# Patient Record
Sex: Male | Born: 1950
Health system: Southern US, Community
[De-identification: ages and names within clinical notes are randomized; demographics above are authoritative.]

## PROBLEM LIST (undated history)

## (undated) DIAGNOSIS — E1169 Type 2 diabetes mellitus with other specified complication: Secondary | ICD-10-CM

## (undated) DIAGNOSIS — E119 Type 2 diabetes mellitus without complications: Secondary | ICD-10-CM

## (undated) DIAGNOSIS — I10 Essential (primary) hypertension: Secondary | ICD-10-CM

## (undated) DIAGNOSIS — E785 Hyperlipidemia, unspecified: Secondary | ICD-10-CM

## (undated) DIAGNOSIS — N400 Enlarged prostate without lower urinary tract symptoms: Secondary | ICD-10-CM

## (undated) HISTORY — DX: Type 2 diabetes mellitus with other specified complication: E78.5

## (undated) HISTORY — DX: Type 2 diabetes mellitus with other specified complication: E11.69

## (undated) HISTORY — PX: JOINT REPLACEMENT: SHX530

---

## 2009-01-07 HISTORY — PX: JOINT REPLACEMENT: SHX530

## 2014-10-31 LAB — HM DIABETES EYE EXAM

## 2016-02-21 DIAGNOSIS — E1140 Type 2 diabetes mellitus with diabetic neuropathy, unspecified: Secondary | ICD-10-CM | POA: Diagnosis not present

## 2016-02-21 DIAGNOSIS — I10 Essential (primary) hypertension: Secondary | ICD-10-CM | POA: Diagnosis not present

## 2016-02-21 DIAGNOSIS — Z6832 Body mass index (BMI) 32.0-32.9, adult: Secondary | ICD-10-CM | POA: Diagnosis not present

## 2016-02-21 DIAGNOSIS — E785 Hyperlipidemia, unspecified: Secondary | ICD-10-CM | POA: Diagnosis not present

## 2016-03-21 DIAGNOSIS — N401 Enlarged prostate with lower urinary tract symptoms: Secondary | ICD-10-CM | POA: Diagnosis not present

## 2016-03-21 DIAGNOSIS — R3912 Poor urinary stream: Secondary | ICD-10-CM | POA: Diagnosis not present

## 2016-03-21 DIAGNOSIS — N5201 Erectile dysfunction due to arterial insufficiency: Secondary | ICD-10-CM | POA: Diagnosis not present

## 2016-05-28 DIAGNOSIS — L600 Ingrowing nail: Secondary | ICD-10-CM | POA: Diagnosis not present

## 2016-05-28 DIAGNOSIS — E559 Vitamin D deficiency, unspecified: Secondary | ICD-10-CM | POA: Diagnosis not present

## 2016-05-28 DIAGNOSIS — Z0000 Encounter for general adult medical examination without abnormal findings: Secondary | ICD-10-CM | POA: Diagnosis not present

## 2016-05-28 DIAGNOSIS — I10 Essential (primary) hypertension: Secondary | ICD-10-CM | POA: Diagnosis not present

## 2016-05-28 DIAGNOSIS — E1140 Type 2 diabetes mellitus with diabetic neuropathy, unspecified: Secondary | ICD-10-CM | POA: Diagnosis not present

## 2016-07-11 DIAGNOSIS — Z6832 Body mass index (BMI) 32.0-32.9, adult: Secondary | ICD-10-CM | POA: Diagnosis not present

## 2016-07-11 DIAGNOSIS — E1140 Type 2 diabetes mellitus with diabetic neuropathy, unspecified: Secondary | ICD-10-CM | POA: Diagnosis not present

## 2016-09-11 DIAGNOSIS — Z79899 Other long term (current) drug therapy: Secondary | ICD-10-CM | POA: Diagnosis not present

## 2016-09-11 DIAGNOSIS — B356 Tinea cruris: Secondary | ICD-10-CM | POA: Diagnosis not present

## 2016-09-11 DIAGNOSIS — I10 Essential (primary) hypertension: Secondary | ICD-10-CM | POA: Diagnosis not present

## 2016-09-11 DIAGNOSIS — E1140 Type 2 diabetes mellitus with diabetic neuropathy, unspecified: Secondary | ICD-10-CM | POA: Diagnosis not present

## 2016-10-16 DIAGNOSIS — Z20828 Contact with and (suspected) exposure to other viral communicable diseases: Secondary | ICD-10-CM | POA: Diagnosis not present

## 2016-10-16 DIAGNOSIS — R05 Cough: Secondary | ICD-10-CM | POA: Diagnosis not present

## 2016-10-16 DIAGNOSIS — J309 Allergic rhinitis, unspecified: Secondary | ICD-10-CM | POA: Diagnosis not present

## 2016-11-18 DIAGNOSIS — H524 Presbyopia: Secondary | ICD-10-CM | POA: Diagnosis not present

## 2017-01-15 DIAGNOSIS — Z6831 Body mass index (BMI) 31.0-31.9, adult: Secondary | ICD-10-CM | POA: Diagnosis not present

## 2017-01-15 DIAGNOSIS — J309 Allergic rhinitis, unspecified: Secondary | ICD-10-CM | POA: Diagnosis not present

## 2017-01-15 DIAGNOSIS — I10 Essential (primary) hypertension: Secondary | ICD-10-CM | POA: Diagnosis not present

## 2017-01-15 DIAGNOSIS — E1140 Type 2 diabetes mellitus with diabetic neuropathy, unspecified: Secondary | ICD-10-CM | POA: Diagnosis not present

## 2017-04-24 DIAGNOSIS — Z125 Encounter for screening for malignant neoplasm of prostate: Secondary | ICD-10-CM | POA: Diagnosis not present

## 2017-04-24 DIAGNOSIS — N5201 Erectile dysfunction due to arterial insufficiency: Secondary | ICD-10-CM | POA: Diagnosis not present

## 2017-04-24 DIAGNOSIS — N401 Enlarged prostate with lower urinary tract symptoms: Secondary | ICD-10-CM | POA: Diagnosis not present

## 2017-04-24 DIAGNOSIS — R3912 Poor urinary stream: Secondary | ICD-10-CM | POA: Diagnosis not present

## 2017-06-04 DIAGNOSIS — E1140 Type 2 diabetes mellitus with diabetic neuropathy, unspecified: Secondary | ICD-10-CM | POA: Diagnosis not present

## 2017-07-23 DIAGNOSIS — E1140 Type 2 diabetes mellitus with diabetic neuropathy, unspecified: Secondary | ICD-10-CM | POA: Diagnosis not present

## 2017-07-23 LAB — BASIC METABOLIC PANEL
BUN: 17 (ref 4–21)
CREATININE: 1.2 (ref 0.6–1.3)
Glucose: 115
POTASSIUM: 4.4 (ref 3.4–5.3)
Sodium: 144 (ref 137–147)

## 2017-07-23 LAB — HEPATIC FUNCTION PANEL
ALT: 16 (ref 10–40)
AST: 18 (ref 14–40)
Alkaline Phosphatase: 74 (ref 25–125)
Bilirubin, Total: 0.2

## 2017-07-23 LAB — LIPID PANEL
Cholesterol: 142 (ref 0–200)
HDL: 43 (ref 35–70)
LDL CALC: 71
LDL/HDL RATIO: 1.7
TRIGLYCERIDES: 139 (ref 40–160)

## 2017-07-23 LAB — HEMOGLOBIN A1C: Hgb A1c MFr Bld: 6.8 — AB (ref 4.0–6.0)

## 2017-07-30 DIAGNOSIS — M25512 Pain in left shoulder: Secondary | ICD-10-CM | POA: Diagnosis not present

## 2017-07-30 DIAGNOSIS — M25562 Pain in left knee: Secondary | ICD-10-CM | POA: Diagnosis not present

## 2017-08-18 DIAGNOSIS — M545 Low back pain: Secondary | ICD-10-CM | POA: Diagnosis not present

## 2017-08-18 DIAGNOSIS — R350 Frequency of micturition: Secondary | ICD-10-CM | POA: Diagnosis not present

## 2017-10-15 ENCOUNTER — Other Ambulatory Visit: Payer: Self-pay | Admitting: Internal Medicine

## 2017-10-27 ENCOUNTER — Emergency Department (HOSPITAL_COMMUNITY)
Admission: EM | Admit: 2017-10-27 | Discharge: 2017-10-27 | Disposition: A | Discharge status: Home / Self Care | Payer: Medicare PPO | Attending: Emergency Medicine | Admitting: Emergency Medicine

## 2017-10-27 ENCOUNTER — Encounter (HOSPITAL_COMMUNITY): Payer: Self-pay | Admitting: Emergency Medicine

## 2017-10-27 ENCOUNTER — Emergency Department (HOSPITAL_COMMUNITY): Payer: Medicare PPO

## 2017-10-27 DIAGNOSIS — E119 Type 2 diabetes mellitus without complications: Secondary | ICD-10-CM | POA: Insufficient documentation

## 2017-10-27 DIAGNOSIS — I10 Essential (primary) hypertension: Secondary | ICD-10-CM | POA: Diagnosis not present

## 2017-10-27 DIAGNOSIS — M545 Low back pain, unspecified: Secondary | ICD-10-CM

## 2017-10-27 DIAGNOSIS — Z96653 Presence of artificial knee joint, bilateral: Secondary | ICD-10-CM | POA: Diagnosis not present

## 2017-10-27 HISTORY — DX: Essential (primary) hypertension: I10

## 2017-10-27 HISTORY — DX: Benign prostatic hyperplasia without lower urinary tract symptoms: N40.0

## 2017-10-27 HISTORY — DX: Type 2 diabetes mellitus without complications: E11.9

## 2017-10-27 MED ORDER — METHOCARBAMOL 500 MG PO TABS: 500.0000 mg | ORAL_TABLET | Freq: Two times a day (BID) | ORAL | 0 refills | Status: DC

## 2017-10-27 MED ORDER — HYDROCODONE-ACETAMINOPHEN 5-325 MG PO TABS: 1.0000 | ORAL_TABLET | Freq: Four times a day (QID) | ORAL | 0 refills | Status: DC | PRN

## 2017-10-27 MED ORDER — DICLOFENAC SODIUM 1 % TD GEL: 4.0000 g | Freq: Four times a day (QID) | TRANSDERMAL | 0 refills | Status: DC

## 2017-10-27 MED ORDER — CYCLOBENZAPRINE HCL 10 MG PO TABS
10.0000 mg | ORAL_TABLET | Freq: Once | ORAL | Status: AC
  Administered 2017-10-27: 10 mg via ORAL
  Filled 2017-10-27: qty 1

## 2017-10-27 MED ORDER — LIDOCAINE 5 % EX PTCH: 1.0000 | MEDICATED_PATCH | CUTANEOUS | 0 refills | Status: DC

## 2017-10-27 NOTE — ED Provider Notes (Signed)
Patient placed in [facility] pathway, seen and evaluated   Chief Complaint: back pain  HPI: Kelly Lambert is a 67 y.o. male who presents to the ED with low back pain. The pain started this morning and has gotten a lot worse. Patient took ibuprofen with mild relief. No hx of back problems. Patient denies UTI symptoms. Patient describes the pain as sharp and severe over the spine.  ROS: M/S: low back pain  Physical Exam:  BP 111/74 (BP Location: Right Arm)   Pulse 66   Temp 98.8 F (37.1 C) (Oral)   Resp 17   Ht 5\' 10"  (1.778 m)   Wt 94.8 kg   SpO2 97%   BMI 29.99 kg/m     Gen: No distress  Neuro: Awake and Alert  Skin: Warm and dry  M/S: low back tenderness, right  Initiation of care has begun. The patient has been counseled on the process, plan, and necessity for staying for the completion/evaluation, and the remainder of the medical screening examination    Ashley Murrain, NP 10/29/17 1730    Blanchie Dessert, MD 11/03/17 2107

## 2017-10-27 NOTE — ED Provider Notes (Signed)
[facility] EMERGENCY DEPARTMENT Provider Note   CSN: [id number] Arrival date & time: 10/27/17  1740     History   Chief Complaint Chief Complaint  Patient presents with  . Back Pain    HPI Kelly Lambert is a 67 y.o. male.  HPI   Kelly Lambert is a 67 y.o. male, with a history of DM and HTN, presenting to the ED with right lower back pain for the past several days. He states this was preceded by left lower back pain for several months after a twisting injury. Patient was seen by his PCP 3 days ago and prescribed Flexeril.  He has also been taking ibuprofen. This morning, the pain in the right lower back was worse.  Pain would worsen at different times throughout the day, and then improve over the next few minutes.  He states, "It would just grab me for a minute."  Pain is described as a tightness, 4/10, nonradiating.  Denies fever/chills, falls/trauma, urinary symptoms, changes in bowel or bladder function, abdominal pain, numbness, weakness, or any other complaints.   Past Medical History:  Diagnosis Date  . BPH (benign prostatic hyperplasia)   . Diabetes mellitus without complication ([place])   . Hypertension     There are no active problems to display for this patient.   Past Surgical History:  Procedure Laterality Date  . JOINT REPLACEMENT Bilateral    knee        Home Medications    Prior to Admission medications   Medication Sig Start Date End Date Taking? Authorizing Provider  diclofenac sodium (VOLTAREN) 1 % GEL Apply 4 g topically 4 (four) times daily. 10/27/17   Bessie Boyte C, PA-C  HYDROcodone-acetaminophen (NORCO/VICODIN) 5-325 MG tablet Take 1 tablet by mouth every 6 (six) hours as needed for severe pain. 10/27/17   Syliva Mee C, PA-C  lidocaine (LIDODERM) 5 % Place 1 patch onto the skin daily. Remove & Discard patch within 12 hours or as directed by MD 10/27/17   Citlally Captain C, PA-C  metFORMIN (GLUCOPHAGE) 1000 MG tablet TAKE  1 TABLET TWICE DAILY WITH MORNING AND EVENING MEALS 10/15/17   [name], MD  methocarbamol (ROBAXIN) 500 MG tablet Take 1 tablet (500 mg total) by mouth 2 (two) times daily. 10/27/17   Lorayne Bender, PA-C    Family History History reviewed. No pertinent family history.  Social History Social History   Tobacco Use  . Smoking status: Never Smoker  . Smokeless tobacco: Current User    Types: Chew  Substance Use Topics  . Alcohol use: Not Currently  . Drug use: Not Currently     Allergies   Patient has no known allergies.   Review of Systems Review of Systems  Constitutional: Negative for chills and fever.  Respiratory: Negative for shortness of breath.   Cardiovascular: Negative for chest pain.  Gastrointestinal: Negative for abdominal pain, diarrhea, nausea and vomiting.  Genitourinary: Negative for dysuria, flank pain, frequency, hematuria and testicular pain.  Musculoskeletal: Positive for back pain.  Neurological: Negative for weakness and numbness.  All other systems reviewed and are negative.    Physical Exam Updated Vital Signs BP 111/74 (BP Location: Right Arm)   Pulse 66   Temp 98.8 F (37.1 C) (Oral)   Resp 17   Ht 5\' 10"  (1.778 m)   Wt 94.8 kg   SpO2 97%   BMI 29.99 kg/m   Physical Exam  Constitutional: He appears well-developed and well-nourished. No distress.  HENT:  Head: Normocephalic and atraumatic.  Eyes: Conjunctivae are normal.  Neck: Neck supple.  Cardiovascular: Normal rate, regular rhythm, normal heart sounds and intact distal pulses.  Pulmonary/Chest: Effort normal and breath sounds normal. No respiratory distress.  Abdominal: Soft. There is no tenderness. There is no guarding.  Musculoskeletal: He exhibits no edema.       Back:  Tenderness in the region indicated.  No noted swelling, deformity, or other abnormality.  No midline spinal tenderness.  Ranges the right hip without difficulty or pain.  Pain worsens with twisting or  bending over.  Lymphadenopathy:    He has no cervical adenopathy.  Neurological: He is alert.  Sensation grossly intact to light touch in the bilateral lower extremities. Strength 5/5 in the bilateral lower extremities. Ambulatory without assistance or noted difficulty.  Skin: Skin is warm and dry. He is not diaphoretic.  Psychiatric: He has a normal mood and affect. His behavior is normal.  Nursing note and vitals reviewed.    ED Treatments / Results  Labs (all labs ordered are listed, but only abnormal results are displayed) Labs Reviewed - No data to display  EKG None  Radiology Dg Lumbar Spine Complete  Result Date: 10/27/2017 CLINICAL DATA:  Back pain and hip pain. EXAM: LUMBAR SPINE - COMPLETE 4+ VIEW COMPARISON:  None. FINDINGS: There is no evidence of lumbar spine fracture. Alignment is normal. Intervertebral disc spaces are maintained. IMPRESSION: Negative. Electronically Signed   By: Franki Cabot M.D.   On: 10/27/2017 19:31    Procedures Procedures (including critical care time)  Medications Ordered in ED Medications  cyclobenzaprine (FLEXERIL) tablet 10 mg (10 mg Oral Given 10/27/17 1840)     Initial Impression / Assessment and Plan / ED Course  I have reviewed the triage vital signs and the nursing notes.  Pertinent labs & imaging results that were available during my care of the patient were reviewed by me and considered in my medical decision making (see chart for details).     Patient presents with right lower back pain.  No focal neuro deficits.  No urinary symptoms.  No infectious symptoms.  We will change some medication therapy.  We will add on physical therapy.  He will follow-up with his PCP for any further management of this issue. The patient was given instructions for home care as well as return precautions. Patient voices understanding of these instructions, accepts the plan, and is comfortable with discharge.    Findings and plan of care  discussed with Fredia Sorrow, MD. Dr. Rogene Houston personally evaluated and examined this patient.  Final Clinical Impressions(s) / ED Diagnoses   Final diagnoses:  Acute right-sided low back pain without sciatica    ED Discharge Orders         Ordered    methocarbamol (ROBAXIN) 500 MG tablet  2 times daily     10/27/17 2100    lidocaine (LIDODERM) 5 %  Every 24 hours     10/27/17 2100    diclofenac sodium (VOLTAREN) 1 % GEL  4 times daily     10/27/17 2100    HYDROcodone-acetaminophen (NORCO/VICODIN) 5-325 MG tablet  Every 6 hours PRN     10/27/17 2102           Lorayne Bender, PA-C 10/27/17 2107    Fredia Sorrow, MD 10/28/17 [phone]

## 2017-10-27 NOTE — ED Triage Notes (Signed)
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

## 2017-10-27 NOTE — Discharge Instructions (Addendum)
Take it easy, but do not lay around too much as this may make any stiffness worse.  Acetaminophen: May take acetaminophen (generic for Tylenol), as needed, for pain. Your daily total maximum amount of acetaminophen from all sources should be limited to 4000mg /day for persons without liver problems, or 2000mg /day for those with liver problems. Vicodin: May take Vicodin (hydrocodone-acetaminophen) as needed for severe pain.  Do not drive or perform other dangerous activities while taking the Vicodin.  Please note that each pill of Vicodin contains 325 mg of acetaminophen (Tylenol) and the above dosage limits apply. Diclofenac gel: As an alternative to oral anti-inflammatories, may apply the diclofenac gel. Muscle relaxer: Methocarbamol (generic for Robaxin) is a muscle relaxer and may help loosen stiff muscles. Do not take the methocarbamol while driving or performing other dangerous activities.  If you choose to start taking the methocarbamol, please discontinue taking the cyclobenzaprine (Flexeril). Lidocaine patches: These are available via either prescription or over-the-counter. The over-the-counter option may be more economical one and are likely just as effective. There are multiple over-the-counter brands, such as Salonpas. Exercises: Be sure to perform the attached exercises starting with three times a week and working up to performing them daily. This is an essential part of preventing long term problems.  Follow up: Follow up with a primary care provider for any future management of these complaints. Be sure to follow up within 7-10 days. Return: Return to the ED should symptoms worsen.  For prescription assistance, may try using prescription discount sites or apps, such as goodrx.com

## 2017-10-27 NOTE — ED Provider Notes (Signed)
Medical screening examination/treatment/procedure(s) were conducted as a shared visit with non-physician practitioner(s) and myself.  I personally evaluated the patient during the encounter.  None  Patient seen by me along with physician assistant.  Patient presents with low back pain nonradiating no numbness or weakness to lower extremities no trauma.  Patient works as a Secretary/administrator.  Pain worse today but started about 2 weeks ago.  Saw her primary care provider they started him on Flexeril and ibuprofen.  No real improvement.  X-rays of the lumbar spine here today without any acute findings.  Currently MRI not warranted because there is no sciatic symptoms.  Patient has follow-up with primary care provider.  Physician assistant will modify the Flexeril to Robaxin and give him something stronger for pain.  This will most likely be hydrocodone.  Patient will also receive a work note to be out of work the next 2 days.   Fredia Sorrow, MD 10/27/17 2102

## 2017-11-24 ENCOUNTER — Ambulatory Visit (INDEPENDENT_AMBULATORY_CARE_PROVIDER_SITE_OTHER): Payer: Medicare PPO | Admitting: Internal Medicine

## 2017-11-24 ENCOUNTER — Encounter: Payer: Self-pay | Admitting: Internal Medicine

## 2017-11-24 VITALS — BP 110/70 | HR 107 | Temp 98.5°F | Ht 70.0 in | Wt 204.0 lb

## 2017-11-24 DIAGNOSIS — M545 Low back pain: Secondary | ICD-10-CM

## 2017-11-24 DIAGNOSIS — I129 Hypertensive chronic kidney disease with stage 1 through stage 4 chronic kidney disease, or unspecified chronic kidney disease: Secondary | ICD-10-CM | POA: Diagnosis not present

## 2017-11-24 DIAGNOSIS — E1141 Type 2 diabetes mellitus with diabetic mononeuropathy: Secondary | ICD-10-CM

## 2017-11-24 DIAGNOSIS — N182 Chronic kidney disease, stage 2 (mild): Secondary | ICD-10-CM | POA: Diagnosis not present

## 2017-11-24 DIAGNOSIS — R000 Tachycardia, unspecified: Secondary | ICD-10-CM

## 2017-11-24 DIAGNOSIS — G8929 Other chronic pain: Secondary | ICD-10-CM | POA: Diagnosis not present

## 2017-11-24 MED ORDER — VALSARTAN-HYDROCHLOROTHIAZIDE 160-25 MG PO TABS: 1.0000 | ORAL_TABLET | Freq: Every day | ORAL | 2 refills | Status: DC

## 2017-11-24 NOTE — Progress Notes (Signed)
Subjective:     Patient ID: Kelly Lambert , male    DOB: Nov 29, 1950 , 67 y.o.   MRN: [id number]   Chief Complaint  Patient presents with  . Diabetes  . Hypertension    HPI  Diabetes  He presents for his follow-up diabetic visit. He has type 2 diabetes mellitus. His disease course has been stable. There are no hypoglycemic associated symptoms. Pertinent negatives for hypoglycemia include no dizziness, headaches or hunger. There are no diabetic associated symptoms. There are no hypoglycemic complications. Symptoms are stable. Diabetic complications include nephropathy and peripheral neuropathy. Risk factors for coronary artery disease include diabetes mellitus, hypertension, male sex and dyslipidemia. He is following a diabetic diet. His breakfast blood glucose is taken between 9-10 am. His breakfast blood glucose range is generally 110-130 mg/dl.  Hypertension  This is a chronic problem. The current episode started yesterday. The problem has been gradually improving since onset. The problem is controlled. Pertinent negatives include no headaches. Risk factors for coronary artery disease include diabetes mellitus, male gender and dyslipidemia.   Reports compliance with meds.   Past Medical History:  Diagnosis Date  . BPH (benign prostatic hyperplasia)   . Diabetes mellitus without complication ([place])   . Hyperlipidemia associated with type 2 diabetes mellitus ([place])   . Hypertension      Family History  Problem Relation Age of Onset  . Cancer Mother   . Cancer Father   . Cancer Brother      Current Outpatient Medications:  .  ACCU-CHEK FASTCLIX LANCETS MISC, , Disp: , Rfl:  .  ACCU-CHEK GUIDE test strip, CHECK BLOOD SUGAR ONCE DAILY, Disp: , Rfl: 11 .  amitriptyline (ELAVIL) 25 MG tablet, Take 25 mg by mouth at bedtime., Disp: , Rfl:  .  cyclobenzaprine (FLEXERIL) 5 MG tablet, TK 1 T PO QHS PRN, Disp: , Rfl: 0 .  gabapentin (NEURONTIN) 100 MG capsule, , Disp: , Rfl:  .   lidocaine (LIDODERM) 5 %, Place 1 patch onto the skin daily. Remove & Discard patch within 12 hours or as directed by MD, Disp: 30 patch, Rfl: 0 .  metFORMIN (GLUCOPHAGE) 1000 MG tablet, TAKE 1 TABLET TWICE DAILY WITH MORNING AND EVENING MEALS, Disp: 180 tablet, Rfl: 2 .  methocarbamol (ROBAXIN) 500 MG tablet, Take 1 tablet (500 mg total) by mouth 2 (two) times daily., Disp: 20 tablet, Rfl: 0 .  pantoprazole (PROTONIX) 40 MG tablet, , Disp: , Rfl:  .  pravastatin (PRAVACHOL) 40 MG tablet, , Disp: , Rfl:  .  tamsulosin (FLOMAX) 0.4 MG CAPS capsule, , Disp: , Rfl:  .  valsartan-hydrochlorothiazide (DIOVAN-HCT) 160-25 MG tablet, Take 1 tablet by mouth daily., Disp: 90 tablet, Rfl: 2   No Known Allergies   Review of Systems  Constitutional: Negative.   Respiratory: Negative.   Cardiovascular: Negative.   Gastrointestinal: Negative.   Neurological: Negative.  Negative for dizziness and headaches.  Psychiatric/Behavioral: Negative.      Today's Vitals   11/24/17 0936  BP: 110/70  Pulse: (!) 107  Temp: 98.5 F (36.9 C)  TempSrc: Oral  SpO2: 97%  Weight: 204 lb (92.5 kg)  Height: 5' 10"  (1.778 m)   Body mass index is 29.27 kg/m.   Objective:  Physical Exam  Constitutional: He is oriented to person, place, and time. He appears well-developed and well-nourished.  HENT:  Head: Normocephalic and atraumatic.  Eyes: EOM are normal.  Neck: Neck supple.  Cardiovascular: Normal rate, regular rhythm and normal heart sounds.  Pulmonary/Chest: Effort normal and breath sounds normal.  Neurological: He is alert and oriented to person, place, and time.  Psychiatric: He has a normal mood and affect.  Nursing note and vitals reviewed.       Assessment And Plan:     1. Diabetic mononeuropathy associated with type 2 diabetes mellitus ([place])  I will check labs listed below. He will rto in four months for re-evaluation. He is encouraged to avoid sugary beverages, including sodas, juices and  diet drinks. He declines flu vaccine. States he got many years ago and "got sick".  - CMP14+EGFR - Hemoglobin A1c  2. Hypertensive nephropathy  Well controlled. He will continue with current meds. He is encouraged to avoid adding salt to his foods.   3. Chronic renal disease, stage II  Chronic, he is encouraged to stay well hydrated.   4. Chronic bilateral low back pain without sciatica  Chronic. He has been advised to increase gabapentin to TWO 123m capsules nightly. IN a week or two, he will let me know how he is doing. If needed, I will increase to 3054mnightly. He verbalizes understanding of his treatment plan and voices no questions or concerns.   5. Tachycardia  He is encouraged to stay well hydrated. He denies palpitations, chest discomfort. I will re-evaluate at his next visit. If needed, I will add on B-blocker at next visit.    RoMaximino GreenlandMD

## 2017-11-24 NOTE — Patient Instructions (Signed)

## 2017-11-25 LAB — CMP14+EGFR
ALBUMIN: 4.6 g/dL (ref 3.6–4.8)
ALT: 18 IU/L (ref 0–44)
AST: 17 IU/L (ref 0–40)
Albumin/Globulin Ratio: 1.6 (ref 1.2–2.2)
Alkaline Phosphatase: 82 IU/L (ref 39–117)
BILIRUBIN TOTAL: 0.3 mg/dL (ref 0.0–1.2)
BUN / CREAT RATIO: 12 (ref 10–24)
BUN: 15 mg/dL (ref 8–27)
CALCIUM: 11.2 mg/dL — AB (ref 8.6–10.2)
CHLORIDE: 102 mmol/L (ref 96–106)
CO2: 23 mmol/L (ref 20–29)
Creatinine, Ser: 1.23 mg/dL (ref 0.76–1.27)
GFR, EST AFRICAN AMERICAN: 70 mL/min/{1.73_m2} (ref >59)
GFR, EST NON AFRICAN AMERICAN: 61 mL/min/{1.73_m2} (ref >59)
GLOBULIN, TOTAL: 2.8 g/dL (ref 1.5–4.5)
Glucose: 106 mg/dL — ABNORMAL HIGH (ref 65–99)
Potassium: 4.6 mmol/L (ref 3.5–5.2)
SODIUM: 144 mmol/L (ref 134–144)
TOTAL PROTEIN: 7.4 g/dL (ref 6.0–8.5)

## 2017-11-25 LAB — HEMOGLOBIN A1C
Est. average glucose Bld gHb Est-mCnc: 148 mg/dL
Hgb A1c MFr Bld: 6.8 % — ABNORMAL HIGH (ref 4.8–5.6)

## 2017-11-25 NOTE — Progress Notes (Signed)
Your liver and kidney function are stable. However, your calcium level is quite elevated. Are you taking calcium supplements? If yes, pls stop. hba1c is 6.8, this is pretty good. Kw-pls abstract last a1c, lipid. Thanks.

## 2017-11-26 ENCOUNTER — Encounter: Payer: Self-pay | Admitting: Internal Medicine

## 2017-11-27 LAB — PROTEIN ELECTROPHORESIS
A/G Ratio: 1.1 (ref 0.7–1.7)
Albumin ELP: 3.9 g/dL (ref 2.9–4.4)
Alpha 1: 0.2 g/dL (ref 0.0–0.4)
Alpha 2: 0.7 g/dL (ref 0.4–1.0)
BETA: 1.2 g/dL (ref 0.7–1.3)
GAMMA GLOBULIN: 1.6 g/dL (ref 0.4–1.8)
GLOBULIN, TOTAL: 3.6 g/dL (ref 2.2–3.9)
Total Protein: 7.5 g/dL (ref 6.0–8.5)

## 2017-11-27 LAB — SPECIMEN STATUS REPORT

## 2017-12-05 DIAGNOSIS — H524 Presbyopia: Secondary | ICD-10-CM | POA: Diagnosis not present

## 2017-12-05 LAB — HM DIABETES EYE EXAM

## 2017-12-08 ENCOUNTER — Other Ambulatory Visit: Payer: Self-pay | Admitting: Internal Medicine

## 2017-12-09 ENCOUNTER — Other Ambulatory Visit: Payer: Self-pay | Admitting: Internal Medicine

## 2017-12-09 NOTE — Telephone Encounter (Signed)
cyclobenazeprine refill

## 2018-01-22 ENCOUNTER — Other Ambulatory Visit: Payer: Self-pay

## 2018-01-22 ENCOUNTER — Other Ambulatory Visit: Payer: Self-pay | Admitting: Internal Medicine

## 2018-01-29 ENCOUNTER — Other Ambulatory Visit: Payer: Self-pay

## 2018-01-29 MED ORDER — ACCU-CHEK GUIDE VI STRP: ORAL_STRIP | 2 refills | Status: DC

## 2018-01-29 MED ORDER — ACCU-CHEK FASTCLIX LANCETS MISC: 2 refills | Status: DC

## 2018-02-16 DIAGNOSIS — N5201 Erectile dysfunction due to arterial insufficiency: Secondary | ICD-10-CM | POA: Diagnosis not present

## 2018-02-16 DIAGNOSIS — N401 Enlarged prostate with lower urinary tract symptoms: Secondary | ICD-10-CM | POA: Diagnosis not present

## 2018-02-16 DIAGNOSIS — R350 Frequency of micturition: Secondary | ICD-10-CM | POA: Diagnosis not present

## 2018-02-17 ENCOUNTER — Other Ambulatory Visit: Payer: Self-pay | Admitting: Internal Medicine

## 2018-02-20 ENCOUNTER — Other Ambulatory Visit: Payer: Self-pay | Admitting: Internal Medicine

## 2018-02-20 MED ORDER — FLUTICASONE PROPIONATE 50 MCG/ACT NA SUSP: 1.0000 | Freq: Every day | NASAL | 2 refills | Status: AC

## 2018-03-16 ENCOUNTER — Ambulatory Visit (INDEPENDENT_AMBULATORY_CARE_PROVIDER_SITE_OTHER): Payer: Medicare PPO | Admitting: Internal Medicine

## 2018-03-16 ENCOUNTER — Encounter: Payer: Self-pay | Admitting: Internal Medicine

## 2018-03-16 ENCOUNTER — Ambulatory Visit: Payer: Medicare PPO | Admitting: Internal Medicine

## 2018-03-16 VITALS — BP 118/72 | HR 96 | Temp 98.2°F | Ht 70.0 in | Wt 206.8 lb

## 2018-03-16 DIAGNOSIS — E7800 Pure hypercholesterolemia, unspecified: Secondary | ICD-10-CM | POA: Diagnosis not present

## 2018-03-16 DIAGNOSIS — Z23 Encounter for immunization: Secondary | ICD-10-CM

## 2018-03-16 DIAGNOSIS — N182 Chronic kidney disease, stage 2 (mild): Secondary | ICD-10-CM | POA: Diagnosis not present

## 2018-03-16 DIAGNOSIS — E1122 Type 2 diabetes mellitus with diabetic chronic kidney disease: Secondary | ICD-10-CM

## 2018-03-16 DIAGNOSIS — I129 Hypertensive chronic kidney disease with stage 1 through stage 4 chronic kidney disease, or unspecified chronic kidney disease: Secondary | ICD-10-CM

## 2018-03-16 DIAGNOSIS — E663 Overweight: Secondary | ICD-10-CM

## 2018-03-16 MED ORDER — TETANUS-DIPHTH-ACELL PERTUSSIS 5-2-15.5 LF-MCG/0.5 IM SUSP: 0.5000 mL | Freq: Once | INTRAMUSCULAR | 0 refills | Status: AC

## 2018-03-16 NOTE — Patient Instructions (Signed)
Diabetes Mellitus and Nutrition, Adult  When you have diabetes (diabetes mellitus), it is very important to have healthy eating habits because your blood sugar (glucose) levels are greatly affected by what you eat and drink. Eating healthy foods in the appropriate amounts, at about the same times every day, can help you:  · Control your blood glucose.  · Lower your risk of heart disease.  · Improve your blood pressure.  · Reach or maintain a healthy weight.  Every person with diabetes is different, and each person has different needs for a meal plan. Your health care provider may recommend that you work with a diet and nutrition specialist (dietitian) to make a meal plan that is best for you. Your meal plan may vary depending on factors such as:  · The calories you need.  · The medicines you take.  · Your weight.  · Your blood glucose, blood pressure, and cholesterol levels.  · Your activity level.  · Other health conditions you have, such as heart or kidney disease.  How do carbohydrates affect me?  Carbohydrates, also called carbs, affect your blood glucose level more than any other type of food. Eating carbs naturally raises the amount of glucose in your blood. Carb counting is a method for keeping track of how many carbs you eat. Counting carbs is important to keep your blood glucose at a healthy level, especially if you use insulin or take certain oral diabetes medicines.  It is important to know how many carbs you can safely have in each meal. This is different for every person. Your dietitian can help you calculate how many carbs you should have at each meal and for each snack.  Foods that contain carbs include:  · Bread, cereal, rice, pasta, and crackers.  · Potatoes and corn.  · Peas, beans, and lentils.  · Milk and yogurt.  · Fruit and juice.  · Desserts, such as cakes, cookies, ice cream, and candy.  How does alcohol affect me?  Alcohol can cause a sudden decrease in blood glucose (hypoglycemia),  especially if you use insulin or take certain oral diabetes medicines. Hypoglycemia can be a life-threatening condition. Symptoms of hypoglycemia (sleepiness, dizziness, and confusion) are similar to symptoms of having too much alcohol.  If your health care provider says that alcohol is safe for you, follow these guidelines:  · Limit alcohol intake to no more than 1 drink per day for nonpregnant women and 2 drinks per day for men. One drink equals 12 oz of beer, 5 oz of wine, or 1½ oz of hard liquor.  · Do not drink on an empty stomach.  · Keep yourself hydrated with water, diet soda, or unsweetened iced tea.  · Keep in mind that regular soda, juice, and other mixers may contain a lot of sugar and must be counted as carbs.  What are tips for following this plan?    Reading food labels  · Start by checking the serving size on the "Nutrition Facts" label of packaged foods and drinks. The amount of calories, carbs, fats, and other nutrients listed on the label is based on one serving of the item. Many items contain more than one serving per package.  · Check the total grams (g) of carbs in one serving. You can calculate the number of servings of carbs in one serving by dividing the total carbs by 15. For example, if a food has 30 g of total carbs, it would be equal to 2   servings of carbs.  · Check the number of grams (g) of saturated and trans fats in one serving. Choose foods that have low or no amount of these fats.  · Check the number of milligrams (mg) of salt (sodium) in one serving. Most people should limit total sodium intake to less than 2,300 mg per day.  · Always check the nutrition information of foods labeled as "low-fat" or "nonfat". These foods may be higher in added sugar or refined carbs and should be avoided.  · Talk to your dietitian to identify your daily goals for nutrients listed on the label.  Shopping  · Avoid buying canned, premade, or processed foods. These foods tend to be high in fat, sodium,  and added sugar.  · Shop around the outside edge of the grocery store. This includes fresh fruits and vegetables, bulk grains, fresh meats, and fresh dairy.  Cooking  · Use low-heat cooking methods, such as baking, instead of high-heat cooking methods like deep frying.  · Cook using healthy oils, such as olive, canola, or sunflower oil.  · Avoid cooking with butter, cream, or high-fat meats.  Meal planning  · Eat meals and snacks regularly, preferably at the same times every day. Avoid going long periods of time without eating.  · Eat foods high in fiber, such as fresh fruits, vegetables, beans, and whole grains. Talk to your dietitian about how many servings of carbs you can eat at each meal.  · Eat 4-6 ounces (oz) of lean protein each day, such as lean meat, chicken, fish, eggs, or tofu. One oz of lean protein is equal to:  ? 1 oz of meat, chicken, or fish.  ? 1 egg.  ? ¼ cup of tofu.  · Eat some foods each day that contain healthy fats, such as avocado, nuts, seeds, and fish.  Lifestyle  · Check your blood glucose regularly.  · Exercise regularly as told by your health care provider. This may include:  ? 150 minutes of moderate-intensity or vigorous-intensity exercise each week. This could be brisk walking, biking, or water aerobics.  ? Stretching and doing strength exercises, such as yoga or weightlifting, at least 2 times a week.  · Take medicines as told by your health care provider.  · Do not use any products that contain nicotine or tobacco, such as cigarettes and e-cigarettes. If you need help quitting, ask your health care provider.  · Work with a counselor or diabetes educator to identify strategies to manage stress and any emotional and social challenges.  Questions to ask a health care provider  · Do I need to meet with a diabetes educator?  · Do I need to meet with a dietitian?  · What number can I call if I have questions?  · When are the best times to check my blood glucose?  Where to find more  information:  · American Diabetes Association: diabetes.org  · Academy of Nutrition and Dietetics: www.eatright.org  · National Institute of Diabetes and Digestive and Kidney Diseases (NIH): www.niddk.nih.gov  Summary  · A healthy meal plan will help you control your blood glucose and maintain a healthy lifestyle.  · Working with a diet and nutrition specialist (dietitian) can help you make a meal plan that is best for you.  · Keep in mind that carbohydrates (carbs) and alcohol have immediate effects on your blood glucose levels. It is important to count carbs and to use alcohol carefully.  This information is not intended to   replace advice given to you by your health care provider. Make sure you discuss any questions you have with your health care provider.  Document Released: 09/20/2004 Document Revised: 07/24/2016 Document Reviewed: 01/29/2016  [employer]vier Interactive Patient Education © 2019 [employer]

## 2018-03-16 NOTE — Progress Notes (Signed)
Subjective:     Patient ID: Kelly Lambert , male    DOB: 1950-02-18 , 68 y.o.   MRN: [id number]   Chief Complaint  Patient presents with  . Diabetes  . Hypertension    HPI  Diabetes  He presents for his follow-up diabetic visit. He has type 2 diabetes mellitus. There are no hypoglycemic associated symptoms. Pertinent negatives for diabetes include no blurred vision and no chest pain. There are no hypoglycemic complications. Diabetic complications include nephropathy. Risk factors for coronary artery disease include diabetes mellitus, dyslipidemia, hypertension and male sex. His breakfast blood glucose is taken between 9-10 am. His breakfast blood glucose range is generally 110-130 mg/dl. Eye exam is current.  Hypertension  This is a chronic problem. The current episode started more than 1 year ago. The problem has been gradually improving since onset. The problem is controlled. Pertinent negatives include no blurred vision, chest pain, palpitations or shortness of breath.   Reports compliance with meds.   Past Medical History:  Diagnosis Date  . BPH (benign prostatic hyperplasia)   . Diabetes mellitus without complication ([place])   . Hyperlipidemia associated with type 2 diabetes mellitus ([place])   . Hypertension      Family History  Problem Relation Age of Onset  . Cancer Mother   . Cancer Father   . Cancer Brother      Current Outpatient Medications:  .  ACCU-CHEK FASTCLIX LANCETS MISC, Use as directed to check blood sugars 1 time per day dx: e11.65, Disp: 150 each, Rfl: 2 .  ACCU-CHEK GUIDE test strip, CHECK BLOOD SUGAR ONCE DAILY dx: e11.65, Disp: 150 each, Rfl: 2 .  amitriptyline (ELAVIL) 25 MG tablet, Take 25 mg by mouth at bedtime., Disp: , Rfl:  .  cyclobenzaprine (FLEXERIL) 5 MG tablet, TAKE 1 TABLET BY MOUTH EVERY NIGHT AT BEDTIME AS NEEDED, Disp: 30 tablet, Rfl: 0 .  fluticasone (FLONASE) 50 MCG/ACT nasal spray, Place 1 spray into both nostrils daily., Disp: 16 g,  Rfl: 2 .  gabapentin (NEURONTIN) 100 MG capsule, TAKE 1 CAPSULE AT BEDTIME, Disp: 90 capsule, Rfl: 0 .  metFORMIN (GLUCOPHAGE) 1000 MG tablet, TAKE 1 TABLET TWICE DAILY WITH MORNING AND EVENING MEALS, Disp: 180 tablet, Rfl: 2 .  pantoprazole (PROTONIX) 40 MG tablet, TAKE 1 TABLET EVERY DAY, Disp: 90 tablet, Rfl: 1 .  pravastatin (PRAVACHOL) 40 MG tablet, TAKE 1 TABLET EVERY DAY, Disp: 90 tablet, Rfl: 0 .  tamsulosin (FLOMAX) 0.4 MG CAPS capsule, TAKE 1 CAPSULE EVERY DAY 30 MINUTES AFTER THE SAME MEAL EACH DAY, Disp: 90 capsule, Rfl: 0 .  lidocaine (LIDODERM) 5 %, Place 1 patch onto the skin daily. Remove & Discard patch within 12 hours or as directed by MD (Patient not taking: Reported on 03/16/2018), Disp: 30 patch, Rfl: 0 .  methocarbamol (ROBAXIN) 500 MG tablet, Take 1 tablet (500 mg total) by mouth 2 (two) times daily. (Patient not taking: Reported on 03/16/2018), Disp: 20 tablet, Rfl: 0 .  valsartan-hydrochlorothiazide (DIOVAN-HCT) 160-25 MG tablet, Take 1 tablet by mouth daily., Disp: 90 tablet, Rfl: 2   No Known Allergies   Review of Systems  Constitutional: Negative.   Eyes: Negative for blurred vision.  Respiratory: Negative.  Negative for shortness of breath.   Cardiovascular: Negative.  Negative for chest pain and palpitations.  Gastrointestinal: Negative.   Neurological: Negative.   Psychiatric/Behavioral: Negative.      Today's Vitals   03/16/18 1000  BP: 118/72  Pulse: 96  Temp: 98.2 F (  36.8 C)  TempSrc: Oral  Weight: 206 lb 12.8 oz (93.8 kg)  Height: _0  (1.778 m)  PainSc: 0-No pain   Body mass index is 29.67 kg/m.   Objective:  Physical Exam Vitals signs and nursing note reviewed.  Constitutional:      Appearance: Normal appearance.  Cardiovascular:     Rate and Rhythm: Normal rate and regular rhythm.     Heart sounds: Normal heart sounds.  Pulmonary:     Effort: Pulmonary effort is normal.     Breath sounds: Normal breath sounds.  Skin:    General:  Skin is warm.  Neurological:     General: No focal deficit present.     Mental Status: He is alert.  Psychiatric:        Mood and Affect: Mood normal.         Assessment And Plan:     1. Type 2 diabetes mellitus with stage 2 chronic kidney disease, without long-term current use of insulin ([place])  I will check labs as listed below. Importance of regular exercise was discussed with the patient. He will rto for his next evaluation in June 2020.   - Hepatitis C antibody - CMP14+EGFR - Hemoglobin A1c  2. Hypertensive nephropathy  Well controlled. He will continue with current meds. He will rto in June 2020 for his next AWV.   3. Pure hypercholesterolemia  I will check a fasting lipid panel at his next visit in June 2020.   4. Overweight with body mass index (BMI) 25.0-29.9  He is encouraged to strive for BMI less than 27 to decrease cardiac risk.   5. Need for vaccination  A rx for TDap was sent to the pharmacy.   Maximino Greenland, MD

## 2018-03-17 LAB — HEPATITIS C ANTIBODY: Hep C Virus Ab: <0.1 s/co ratio (ref 0.0–0.9)

## 2018-03-17 LAB — CMP14+EGFR
ALT: 15 IU/L (ref 0–44)
AST: 16 IU/L (ref 0–40)
Albumin/Globulin Ratio: 1.5 (ref 1.2–2.2)
Albumin: 4.5 g/dL (ref 3.8–4.8)
Alkaline Phosphatase: 83 IU/L (ref 39–117)
BILIRUBIN TOTAL: 0.3 mg/dL (ref 0.0–1.2)
BUN/Creatinine Ratio: 13 (ref 10–24)
BUN: 16 mg/dL (ref 8–27)
CO2: 23 mmol/L (ref 20–29)
Calcium: 10.7 mg/dL — ABNORMAL HIGH (ref 8.6–10.2)
Chloride: 102 mmol/L (ref 96–106)
Creatinine, Ser: 1.26 mg/dL (ref 0.76–1.27)
GFR calc Af Amer: 68 mL/min/{1.73_m2} (ref >59)
GFR calc non Af Amer: 59 mL/min/{1.73_m2} — ABNORMAL LOW (ref >59)
Globulin, Total: 3.1 g/dL (ref 1.5–4.5)
Glucose: 115 mg/dL — ABNORMAL HIGH (ref 65–99)
Potassium: 4.8 mmol/L (ref 3.5–5.2)
Sodium: 141 mmol/L (ref 134–144)
Total Protein: 7.6 g/dL (ref 6.0–8.5)

## 2018-03-17 LAB — HEMOGLOBIN A1C
Est. average glucose Bld gHb Est-mCnc: 151 mg/dL
Hgb A1c MFr Bld: 6.9 % — ABNORMAL HIGH (ref 4.8–5.6)

## 2018-04-07 ENCOUNTER — Ambulatory Visit: Payer: Self-pay

## 2018-04-07 DIAGNOSIS — E1122 Type 2 diabetes mellitus with diabetic chronic kidney disease: Secondary | ICD-10-CM

## 2018-04-07 DIAGNOSIS — N182 Chronic kidney disease, stage 2 (mild): Principal | ICD-10-CM

## 2018-04-07 DIAGNOSIS — I129 Hypertensive chronic kidney disease with stage 1 through stage 4 chronic kidney disease, or unspecified chronic kidney disease: Secondary | ICD-10-CM

## 2018-04-07 NOTE — Patient Instructions (Signed)
Social Worker Visit Information   Materials provided: Verbal education about CCM program provided by phone  [name] was given information about Chronic Care Management services today including:  1. CCM service includes personalized support from designated clinical staff supervised by his physician, including individualized plan of care and coordination with other care providers 2. 24/7 contact phone numbers for assistance for urgent and routine care needs. 3. Service will only be billed when office clinical staff spend 20 minutes or more in a month to coordinate care. 4. Only one practitioner may furnish and bill the service in a calendar month. 5. The patient may stop CCM services at any time (effective at the end of the month) by phone call to the office staff. 6. The patient will be responsible for cost sharing (co-pay) of up to 20% of the service fee (after annual deductible is met).  Patient agreed to services and verbal consent obtained.   The patient verbalized understanding of instructions provided today and declined a print copy of patient instruction materials.   Follow up plan: The patient will be engaged by [facility] nurse case manager at a later date. No social work needs at this time.   Daneen Schick, BSW, CDP [facility] / [facility] Care Management Social Worker [phone]

## 2018-04-07 NOTE — Chronic Care Management (AMB) (Signed)
  Chronic Care Management   Telephone Outreach Note  04/07/2018 Name: Kelly Lambert MRN: [id number] DOB: 24-Jan-1950  Referred by: patient's health plan.   I reached out to Mr. Giancarlos Berendt today by phone in response to a referral sent by Mr. Lan Mcneill health plan. Mr. Jaren Vanetten and I briefly discussed care management needs related to DMII.  Mr. Demore was given information about Chronic Care Management services today including:  1. CCM service includes personalized support from designated clinical staff supervised by his physician, including individualized plan of care and coordination with other care providers 2. 24/7 contact phone numbers for assistance for urgent and routine care needs. 3. Service will only be billed when office clinical staff spend 20 minutes or more in a month to coordinate care. 4. Only one practitioner may furnish and bill the service in a calendar month. 5. The patient may stop CCM services at any time (effective at the end of the month) by phone call to the office staff. 6. The patient will be responsible for cost sharing (co-pay) of up to 20% of the service fee (after annual deductible is met).  Patient agreed to services and verbal consent obtained.    I completed an SDOH (Social Determinants of Health) screening with the patient during today's call. The patient does not indicate challenges in any area besides use of tobacco products. The patient is not currently interested in quitting. The patient is currently out of work due to coronavirus but reports he is continuing to receive income. The patient lives with his wife and one of his children. The patient reports he is still currently driving and has no concerns with transportation needs. The patient participates in daily exercise program by walking 7-8 miles daily. The patient is actively involved in church and prior to [employer] pandemic attended weekly bible study classes. No SWK needs  identified during today's call. The patient will be engaged by [facility] nurse case manager at a later date.  The CM team will reach out to the patient again over the next 7-10 days.    [name], MD has been notified of this outreach and Mr. Kingsley Farace decision and plan.   Daneen Schick, BSW, CDP [facility] / [facility] Care Management Social Worker [phone]  Total time spent performing care coordination and/or care management activities with the patient by phone or face to face = 10 minutes.

## 2018-04-20 ENCOUNTER — Other Ambulatory Visit: Payer: Self-pay

## 2018-04-20 ENCOUNTER — Other Ambulatory Visit: Payer: Self-pay | Admitting: Internal Medicine

## 2018-04-20 MED ORDER — TAMSULOSIN HCL 0.4 MG PO CAPS: ORAL_CAPSULE | ORAL | 0 refills | Status: DC

## 2018-04-20 MED ORDER — PRAVASTATIN SODIUM 40 MG PO TABS: 40.0000 mg | ORAL_TABLET | Freq: Every day | ORAL | 0 refills | Status: DC

## 2018-04-20 MED ORDER — GABAPENTIN 100 MG PO CAPS: 100.0000 mg | ORAL_CAPSULE | Freq: Every day | ORAL | 0 refills | Status: DC

## 2018-04-30 ENCOUNTER — Ambulatory Visit: Payer: Self-pay | Admitting: *Deleted

## 2018-04-30 NOTE — Chronic Care Management (AMB) (Signed)
  Chronic Care Management   Outreach Note  04/30/2018 Name: Kelly Lambert MRN: [id number] DOB: 1950/01/18  Referred by: [name], MD Reason for referral : Chronic Care Management (Outreach Attempt #1 unsuccessful)   An unsuccessful telephone outreach was attempted today. The patient was referred to the case management team by for assistance with DM and HTN disease management and care coordination. I left a HIPPA compliant message with [name] requesting a return call.   Follow Up Plan: The CM team will reach out to the patient again over the next 7 days.   [place] Nurse Care Coordinator [facility] Care Management [phone]

## 2018-05-05 ENCOUNTER — Telehealth: Payer: Self-pay

## 2018-05-05 ENCOUNTER — Ambulatory Visit (INDEPENDENT_AMBULATORY_CARE_PROVIDER_SITE_OTHER): Payer: Medicare PPO

## 2018-05-05 DIAGNOSIS — N182 Chronic kidney disease, stage 2 (mild): Secondary | ICD-10-CM | POA: Diagnosis not present

## 2018-05-05 DIAGNOSIS — E1122 Type 2 diabetes mellitus with diabetic chronic kidney disease: Secondary | ICD-10-CM

## 2018-05-05 DIAGNOSIS — I129 Hypertensive chronic kidney disease with stage 1 through stage 4 chronic kidney disease, or unspecified chronic kidney disease: Secondary | ICD-10-CM

## 2018-05-05 DIAGNOSIS — G8929 Other chronic pain: Secondary | ICD-10-CM

## 2018-05-05 DIAGNOSIS — M545 Low back pain: Secondary | ICD-10-CM

## 2018-05-05 NOTE — Chronic Care Management (AMB) (Signed)
  Chronic Care Management   Outreach Note  05/05/2018 Name: Kelly Lambert MRN: [id number] DOB: 08-11-1950  Referred by: [name], MD Reason for referral : Chronic Care Management (Initial CCM RN Telephone Outreach)   Second unsuccessful telephone outreach was attempted today. The patient was referred to the case management team by his health plan for assistance with DM and HTN disease management and care coordination. I spoke with [name] today for a brief moment however, he requested a call back at the end of the week.   Follow Up Plan: Telephone follow up appointment with CCM team member scheduled for: 05/08/18  Barb Merino, [facility] Care Management Coordinator [place] [facility]  Direct Phone: [phone]

## 2018-05-08 ENCOUNTER — Telehealth: Payer: Self-pay

## 2018-05-18 ENCOUNTER — Ambulatory Visit: Payer: Self-pay

## 2018-05-18 ENCOUNTER — Other Ambulatory Visit: Payer: Self-pay

## 2018-05-18 ENCOUNTER — Telehealth: Payer: Self-pay

## 2018-05-18 DIAGNOSIS — E1122 Type 2 diabetes mellitus with diabetic chronic kidney disease: Secondary | ICD-10-CM

## 2018-05-18 DIAGNOSIS — I129 Hypertensive chronic kidney disease with stage 1 through stage 4 chronic kidney disease, or unspecified chronic kidney disease: Secondary | ICD-10-CM

## 2018-05-18 DIAGNOSIS — N182 Chronic kidney disease, stage 2 (mild): Secondary | ICD-10-CM

## 2018-05-18 NOTE — Patient Instructions (Signed)
Visit Information  Goals Addressed      Patient Stated   . "I would like to review nutrtional information to help with meal planning" (pt-stated)       Current Barriers:  Marland Kitchen Knowledge Deficits related to disease process and Self Health management for Diabetes   Nurse Case Manager Clinical Goal(s):  Marland Kitchen Over the next 30 days, patient will work with Consulting civil engineer to address Diabetes disease process, Meal Planning and Knowing What to do for Hypo/Hyperglycemic events.   Interventions:   COMPLETED INITIAL CCM RN TELEPHONE FOLLOW UP TO ESTABLISH PLAN OF CARE . Evaluation of current treatment plan related to Diabetes and patient's adherence to plan as established by provider . Provided education to patient re: recent A1C of 6.9; discussed target A1C . Reviewed medications with patient; assessed for financial hardship and or questions or concerns related current pharmacological treatment regimen-pt denies-uses [employer] mail order . Reviewed elevated Calcium level at last lab check; advised Dr. Baird Cancer would like to further evaluate-pt verbalizes understanding and states "I will address this with her at my next appt in June" . Discussed plans with patient for ongoing care management follow up and provided patient with direct contact information for care management team . Provided patient with printed educational materials related to Meal Planning w/using the plate method, Knowing your A1C, s/s of Hypo/Hyperglycemia  . Reviewed scheduled/upcoming provider appointments including: physical scheduled with Dr. Baird Cancer set for 07/02/18 @10 :30 AM . Provided RN CM contact # and discussed hours of nurse availability . Patient is agreeable to a call back in about 2 weeks to review mailed ed mats  Patient Self Care Activities:  Verbalizes understanding of the education/information provided today . Self administers medications as prescribed . Attends all scheduled provider appointments . Calls pharmacy for  medication refills . Performs ADL's independently . Performs IADL's independently . Calls provider office for new concerns or questions  Initial goal documentation       The patient verbalized understanding of instructions provided today and declined a print copy of patient instruction materials.   The CM team will reach out to the patient again over the next 2-3 weeks.   Barb Merino, RN,CCM Care Management Coordinator [place] [facility]  Direct Phone: [phone]

## 2018-05-18 NOTE — Chronic Care Management (AMB) (Signed)
  Chronic Care Management   Initial Visit Note  05/18/2018 Name: Kelly Lambert MRN: [id number] DOB: 28-Jun-1950  Referred by: [name], MD Reason for referral : Chronic Care Management (#3 INIITIAL CCM RN TELEPHONE OUTREACH )   Kelly Lambert is a 68 y.o. year old male who is a primary care patient of [name], MD. The CCM team was consulted for assistance with chronic disease management and care coordination needs.   Review of patient status, including review of consultants reports, relevant laboratory and other test results, and collaboration with appropriate care team members and the patient's provider was performed as part of comprehensive patient evaluation and provision of chronic care management services.    I spoke with Kelly Lambert by telephone today to establish his plan of care for CCM.   Objective:  Lab Results  Component Value Date   HGBA1C 6.9 (H) 03/16/2018   HGBA1C 6.8 (H) 11/24/2017   HGBA1C 6.8 (A) 07/23/2017   Lab Results  Component Value Date   LDLCALC 71 07/23/2017   CREATININE 1.26 03/16/2018   BP Readings from Last 3 Encounters:  03/16/18 118/72  11/24/17 110/70  10/27/17 111/74    Goals Addressed      Patient Stated   . "I would like to review nutrtional information to help with meal planning" (pt-stated)       Current Barriers:  Marland Kitchen Knowledge Deficits related to disease process and Self Health management for Diabetes   Nurse Case Manager Clinical Goal(s):  Marland Kitchen Over the next 30 days, patient will work with Consulting civil engineer to address Diabetes disease process, Meal Planning and Knowing What to do for Hypo/Hyperglycemic events.   Interventions:   COMPLETED INITIAL CCM RN TELEPHONE FOLLOW UP TO ESTABLISH PLAN OF CARE . Evaluation of current treatment plan related to Diabetes and patient's adherence to plan as established by provider . Provided education to patient re: recent A1C of 6.9; discussed target A1C . Reviewed medications with  patient; assessed for financial hardship and or questions or concerns related current pharmacological treatment regimen-pt denies-uses [employer] mail order . Reviewed elevated Calcium level at last lab check; advised Dr. Baird Cancer would like to further evaluate-pt verbalizes understanding and states "I will address this with her at my next appt in June" . Discussed plans with patient for ongoing care management follow up and provided patient with direct contact information for care management team . Provided patient with printed educational materials related to Meal Planning w/using the plate method, Knowing your A1C, s/s of Hypo/Hyperglycemia  . Reviewed scheduled/upcoming provider appointments including: physical scheduled with Dr. Baird Cancer set for 07/02/18 @10 :30 AM . Provided RN CM contact # and discussed hours of nurse availability . Patient is agreeable to a call back in about 2 weeks to review mailed ed mats  Patient Self Care Activities:  Verbalizes understanding of the education/information provided today . Self administers medications as prescribed . Attends all scheduled provider appointments . Calls pharmacy for medication refills . Performs ADL's independently . Performs IADL's independently . Calls provider office for new concerns or questions  Initial goal documentation       The CM team will reach out to the patient again over the next 2-3 weeks.   Barb Merino, RN,CCM Care Management Coordinator [place] [facility]  Direct Phone: [phone]

## 2018-05-21 ENCOUNTER — Other Ambulatory Visit: Payer: Self-pay

## 2018-05-21 ENCOUNTER — Ambulatory Visit (INDEPENDENT_AMBULATORY_CARE_PROVIDER_SITE_OTHER): Payer: Medicare PPO

## 2018-05-21 VITALS — Ht 70.0 in | Wt 209.0 lb

## 2018-05-21 DIAGNOSIS — Z0000 Encounter for general adult medical examination without abnormal findings: Secondary | ICD-10-CM | POA: Diagnosis not present

## 2018-05-21 NOTE — Progress Notes (Signed)
Subjective:   Kelly Lambert is a 68 y.o. male who presents for Medicare Annual/Subsequent preventive examination.  This visit type was conducted due to national recommendations for restrictions regarding the COVID-19 Pandemic (e.g. social distancing). This format is felt to be most appropriate for this patient at this time. All issues noted in this document were discussed and addressed. No physical exam was performed (except for noted visual exam findings with Video Visits). This patient, Kelly Lambert, has given permission to perform this visit via telephone. Vital signs may be absent or patient reported.  Patient location: at home   Nurse location:  [place] office     Review of Systems:  n/a Cardiac Risk Factors include: advanced age (>84men, >26 women);male gender;hypertension;diabetes mellitus     Objective:    Vitals: Ht 5\' 10"  (1.778 m) Comment: per patient  Wt 209 lb (94.8 kg) Comment: per patient  BMI 29.99 kg/m   Body mass index is 29.99 kg/m.  Advanced Directives 05/21/2018 04/07/2018  Does Patient Have a Medical Advance Directive? No No  Would patient like information on creating a medical advance directive? - No - Patient declined    Tobacco Social History   Tobacco Use  Smoking Status Former Smoker  . Types: Cigarettes  . Last attempt to quit: 1999  . Years since quitting: 21.3  Smokeless Tobacco Former Systems developer  . Types: Chew  . Quit date: 2018     Counseling given: Not Answered   Clinical Intake:  Pre-visit preparation completed: Yes  Pain : No/denies pain     Nutritional Status: BMI 25 -29 Overweight Nutritional Risks: None Diabetes: Yes CBG done?: No Did pt. bring in CBG monitor from home?: No  How often do you need to have someone help you when you read instructions, pamphlets, or other written materials from your doctor or pharmacy?: 1 - Never What is the last grade level you completed in school?: 12th grade  Interpreter Needed?:  No  Information entered by :: NAllen LPN  Past Medical History:  Diagnosis Date  . BPH (benign prostatic hyperplasia)   . Diabetes mellitus without complication ([place])   . Hyperlipidemia associated with type 2 diabetes mellitus ([place])   . Hypertension    Past Surgical History:  Procedure Laterality Date  . JOINT REPLACEMENT Bilateral    knee   Family History  Problem Relation Age of Onset  . Cancer Mother   . Cancer Father   . Cancer Brother    Social History   Socioeconomic History  . Marital status: Married    Spouse name: Not on file  . Number of children: 3  . Years of education: Not on file  . Highest education level: Not on file  Occupational History  . Occupation: part time work  Scientific laboratory technician  . Financial resource strain: Not very hard  . Food insecurity:    Worry: Never true    Inability: Never true  . Transportation needs:    Medical: No    Non-medical: No  Tobacco Use  . Smoking status: Former Smoker    Types: Cigarettes    Last attempt to quit: 1999    Years since quitting: 21.3  . Smokeless tobacco: Former Systems developer    Types: [place] date: 2018  Substance and Sexual Activity  . Alcohol use: Not Currently  . Drug use: Not Currently  . Sexual activity: Yes  Lifestyle  . Physical activity:    Days per week: 7 days  Minutes per session: 90 min  . Stress: Not at all  Relationships  . Social connections:    Talks on phone: More than three times a week    Gets together: More than three times a week    Attends religious service: More than 4 times per year    Active member of club or organization: Yes    Attends meetings of clubs or organizations: More than 4 times per year    Relationship status: Married  Other Topics Concern  . Not on file  Social History Narrative   Patient reports he works 7 days per week; currently out of work at his M-F job at [employer] due to [employer] pandemic. Patient reports he is still receiving income and works as a  IT trainer. Patient participates in daily exercise walking 7-8 miles per day.    Outpatient Encounter Medications as of 05/21/2018  Medication Sig  . ACCU-CHEK FASTCLIX LANCETS MISC Use as directed to check blood sugars 1 time per day dx: e11.65  . ACCU-CHEK GUIDE test strip CHECK BLOOD SUGAR ONCE DAILY dx: e11.65  . amitriptyline (ELAVIL) 25 MG tablet Take 25 mg by mouth at bedtime.  Marland Kitchen aspirin EC 81 MG tablet Take 81 mg by mouth daily.  . cyclobenzaprine (FLEXERIL) 5 MG tablet TAKE 1 TABLET BY MOUTH EVERY NIGHT AT BEDTIME AS NEEDED  . fluticasone (FLONASE) 50 MCG/ACT nasal spray Place 1 spray into both nostrils daily.  Marland Kitchen gabapentin (NEURONTIN) 100 MG capsule Take 1 capsule (100 mg total) by mouth at bedtime.  . metFORMIN (GLUCOPHAGE) 1000 MG tablet TAKE 1 TABLET TWICE DAILY WITH MORNING AND EVENING MEALS  . pantoprazole (PROTONIX) 40 MG tablet TAKE 1 TABLET EVERY DAY  . pravastatin (PRAVACHOL) 40 MG tablet TAKE 1 TABLET EVERY DAY  . tamsulosin (FLOMAX) 0.4 MG CAPS capsule TAKE 1 CAPSULE EVERY DAY 30 MINUTES AFTER THE SAME MEAL EACH DAY  . valsartan-hydrochlorothiazide (DIOVAN-HCT) 160-25 MG tablet   . lidocaine (LIDODERM) 5 % Place 1 patch onto the skin daily. Remove & Discard patch within 12 hours or as directed by MD (Patient not taking: Reported on 03/16/2018)  . methocarbamol (ROBAXIN) 500 MG tablet Take 1 tablet (500 mg total) by mouth 2 (two) times daily. (Patient not taking: Reported on 03/16/2018)  . valsartan-hydrochlorothiazide (DIOVAN-HCT) 160-25 MG tablet Take 1 tablet by mouth daily.   No facility-administered encounter medications on file as of 05/21/2018.     Activities of Daily Living In your present state of health, do you have any difficulty performing the following activities: 05/21/2018  Hearing? N  Vision? N  Difficulty concentrating or making decisions? N  Walking or climbing stairs? N  Dressing or bathing? N  Doing errands, shopping? N  Preparing Food  and eating ? N  Using the Toilet? N  In the past six months, have you accidently leaked urine? N  Do you have problems with loss of bowel control? N  Managing your Medications? N  Managing your Finances? N  Housekeeping or managing your Housekeeping? N  Some recent data might be hidden    Patient Care Team: [name], MD as PCP - General (Internal Medicine) Daneen Schick as Social Worker [place], Claudette Stapler, RN as Case Manager   Assessment:   This is a routine wellness examination for Kelly Lambert.  Exercise Activities and Dietary recommendations Current Exercise Habits: Home exercise routine, Type of exercise: strength training/weights;walking, Time (Minutes): > 60, Frequency (Times/Week): 7, Weekly Exercise (Minutes/Week): 0, Intensity:  Moderate  Goals    . "I would like to review nutrtional information to help with meal planning" (pt-stated)     Current Barriers:  Marland Kitchen Knowledge Deficits related to disease process and Self Health management for Diabetes   Nurse Case Manager Clinical Goal(s):  Marland Kitchen Over the next 30 days, patient will work with Consulting civil engineer to address Diabetes disease process, Meal Planning and Knowing What to do for Hypo/Hyperglycemic events.   Interventions:   COMPLETED INITIAL CCM RN TELEPHONE FOLLOW UP TO ESTABLISH PLAN OF CARE . Evaluation of current treatment plan related to Diabetes and patient's adherence to plan as established by provider . Provided education to patient re: recent A1C of 6.9; discussed target A1C . Reviewed medications with patient; assessed for financial hardship and or questions or concerns related current pharmacological treatment regimen-pt denies-uses [employer] mail order . Reviewed elevated Calcium level at last lab check; advised Dr. Baird Cancer would like to further evaluate-pt verbalizes understanding and states "I will address this with her at my next appt in June" . Discussed plans with patient for ongoing care management follow up and  provided patient with direct contact information for care management team . Provided patient with printed educational materials related to Meal Planning w/using the plate method, Knowing your A1C, s/s of Hypo/Hyperglycemia  . Reviewed scheduled/upcoming provider appointments including: physical scheduled with Dr. Baird Cancer set for 07/02/18 @10 :30 AM . Provided RN CM contact # and discussed hours of nurse availability . Patient is agreeable to a call back in about 2 weeks to review mailed ed mats  Patient Self Care Activities:  Verbalizes understanding of the education/information provided today . Self administers medications as prescribed . Attends all scheduled provider appointments . Calls pharmacy for medication refills . Performs ADL's independently . Performs IADL's independently . Calls provider office for new concerns or questions  Initial goal documentation      . Patient Stated (pt-stated)     Would like to get off blood pressure medicine and metformin       Fall Risk Fall Risk  05/21/2018 03/16/2018 11/24/2017  Falls in the past year? 1 1 0  Number falls in past yr: 0 1 -  Comment tripped over dog's toy - -  Injury with Fall? 0 1 -  Risk for fall due to : History of fall(s);Medication side effect - -  Follow up Falls prevention discussed - -   Is the patient's home free of loose throw rugs in walkways, pet beds, electrical cords, etc?   yes      Grab bars in the bathroom? no      Handrails on the stairs? n/a      Adequate lighting?   yes  Timed Get Up and Go Performed: n/a  Depression Screen PHQ 2/9 Scores 05/21/2018 04/07/2018 03/16/2018 11/24/2017  PHQ - 2 Score 0 0 0 0  PHQ- 9 Score 0 - - -    Cognitive Function     6CIT Screen 05/21/2018  What Year? 0 points  What month? 0 points  What time? 0 points  Count back from 20 0 points  Months in reverse 0 points  Repeat phrase 0 points  Total Score 0    There is no immunization history for the selected  administration types on file for this patient.  Qualifies for Shingles Vaccine? yes  Screening Tests Health Maintenance  Topic Date Due  . FOOT EXAM  02/27/1961  . COLONOSCOPY  02/27/2001  . TETANUS/TDAP  05/21/2019 (  Originally 02/27/1970)  . PNA vac Low Risk Adult (1 of 2 - PCV13) 05/21/2019 (Originally 02/28/2016)  . INFLUENZA VACCINE  08/08/2018  . HEMOGLOBIN A1C  09/16/2018  . OPHTHALMOLOGY EXAM  12/06/2018  . Hepatitis C Screening  Completed   Cancer Screenings: Lung: Low Dose CT Chest recommended if Age 48-80 years, 30 pack-year currently smoking OR have quit w/in 15years. Patient does not qualify. Colorectal: up to date per patient  Additional Screenings:  Hepatitis C Screening:due      Plan:    Wants to get off blood pressure meds and metformin. Does not take vaccinations.  I have personally reviewed and noted the following in the patient's chart:   . Medical and social history . Use of alcohol, tobacco or illicit drugs  . Current medications and supplements . Functional ability and status . Nutritional status . Physical activity . Advanced directives . List of other physicians . Hospitalizations, surgeries, and ER visits in previous 12 months . Vitals . Screenings to include cognitive, depression, and falls . Referrals and appointments  In addition, I have reviewed and discussed with patient certain preventive protocols, quality metrics, and best practice recommendations. A written personalized care plan for preventive services as well as general preventive health recommendations were provided to patient.     Kellie Simmering, LPN  [id number]

## 2018-05-21 NOTE — Patient Instructions (Signed)
[name] , Thank you for taking time to come for your Medicare Wellness Visit. I appreciate your ongoing commitment to your health goals. Please review the following plan we discussed and let me know if I can assist you in the future.   Screening recommendations/referrals: Colonoscopy: 2016 per patient Recommended yearly ophthalmology/optometry visit for glaucoma screening and checkup Recommended yearly dental visit for hygiene and checkup  Vaccinations: Influenza vaccine: declines Pneumococcal vaccine: declines Tdap vaccine: declines Shingles vaccine: declines    Advanced directives: Advance directive discussed with you today.   Conditions/risks identified: Overweight  Next appointment: 07/02/2018 at 10:30  Preventive Care 35 Years and Older, Male Preventive care refers to lifestyle choices and visits with your health care provider that can promote health and wellness. What does preventive care include?  A yearly physical exam. This is also called an annual well check.  Dental exams once or twice a year.  Routine eye exams. Ask your health care provider how often you should have your eyes checked.  Personal lifestyle choices, including:  Daily care of your teeth and gums.  Regular physical activity.  Eating a healthy diet.  Avoiding tobacco and drug use.  Limiting alcohol use.  Practicing safe sex.  Taking low doses of aspirin every day.  Taking vitamin and mineral supplements as recommended by your health care provider. What happens during an annual well check? The services and screenings done by your health care provider during your annual well check will depend on your age, overall health, lifestyle risk factors, and family history of disease. Counseling  Your health care provider may ask you questions about your:  Alcohol use.  Tobacco use.  Drug use.  Emotional well-being.  Home and relationship well-being.  Sexual activity.  Eating habits.   History of falls.  Memory and ability to understand (cognition).  Work and work Statistician. Screening  You may have the following tests or measurements:  Height, weight, and BMI.  Blood pressure.  Lipid and cholesterol levels. These may be checked every 5 years, or more frequently if you are over 66 years old.  Skin check.  Lung cancer screening. You may have this screening every year starting at age 20 if you have a 30-pack-year history of smoking and currently smoke or have quit within the past 15 years.  Fecal occult blood test (FOBT) of the stool. You may have this test every year starting at age 78.  Flexible sigmoidoscopy or colonoscopy. You may have a sigmoidoscopy every 5 years or a colonoscopy every 10 years starting at age 79.  Prostate cancer screening. Recommendations will vary depending on your family history and other risks.  Hepatitis C blood test.  Hepatitis B blood test.  Sexually transmitted disease (STD) testing.  Diabetes screening. This is done by checking your blood sugar (glucose) after you have not eaten for a while (fasting). You may have this done every 1-3 years.  Abdominal aortic aneurysm (AAA) screening. You may need this if you are a current or former smoker.  Osteoporosis. You may be screened starting at age 4 if you are at high risk. Talk with your health care provider about your test results, treatment options, and if necessary, the need for more tests. Vaccines  Your health care provider may recommend certain vaccines, such as:  Influenza vaccine. This is recommended every year.  Tetanus, diphtheria, and acellular pertussis (Tdap, Td) vaccine. You may need a Td booster every 10 years.  Zoster vaccine. You may need this after  age 61.  Pneumococcal 13-valent conjugate (PCV13) vaccine. One dose is recommended after age 34.  Pneumococcal polysaccharide (PPSV23) vaccine. One dose is recommended after age 7. Talk to your health care  provider about which screenings and vaccines you need and how often you need them. This information is not intended to replace advice given to you by your health care provider. Make sure you discuss any questions you have with your health care provider. Document Released: 01/20/2015 Document Revised: 09/13/2015 Document Reviewed: 10/25/2014 Elsevier Interactive Patient Education  2017 [place] Prevention in the Home Falls can cause injuries. They can happen to people of all ages. There are many things you can do to make your home safe and to help prevent falls. What can I do on the outside of my home?  Regularly fix the edges of walkways and driveways and fix any cracks.  Remove anything that might make you trip as you walk through a door, such as a raised step or threshold.  Trim any bushes or trees on the path to your home.  Use bright outdoor lighting.  Clear any walking paths of anything that might make someone trip, such as rocks or tools.  Regularly check to see if handrails are loose or broken. Make sure that both sides of any steps have handrails.  Any raised decks and porches should have guardrails on the edges.  Have any leaves, snow, or ice cleared regularly.  Use sand or salt on walking paths during winter.  Clean up any spills in your garage right away. This includes oil or grease spills. What can I do in the bathroom?  Use night lights.  Install grab bars by the toilet and in the tub and shower. Do not use towel bars as grab bars.  Use non-skid mats or decals in the tub or shower.  If you need to sit down in the shower, use a plastic, non-slip stool.  Keep the floor dry. Clean up any water that spills on the floor as soon as it happens.  Remove soap buildup in the tub or shower regularly.  Attach bath mats securely with double-sided non-slip rug tape.  Do not have throw rugs and other things on the floor that can make you trip. What can I do in  the bedroom?  Use night lights.  Make sure that you have a light by your bed that is easy to reach.  Do not use any sheets or blankets that are too big for your bed. They should not hang down onto the floor.  Have a firm chair that has side arms. You can use this for support while you get dressed.  Do not have throw rugs and other things on the floor that can make you trip. What can I do in the kitchen?  Clean up any spills right away.  Avoid walking on wet floors.  Keep items that you use a lot in easy-to-reach places.  If you need to reach something above you, use a strong step stool that has a grab bar.  Keep electrical cords out of the way.  Do not use floor polish or wax that makes floors slippery. If you must use wax, use non-skid floor wax.  Do not have throw rugs and other things on the floor that can make you trip. What can I do with my stairs?  Do not leave any items on the stairs.  Make sure that there are handrails on both sides of the stairs  and use them. Fix handrails that are broken or loose. Make sure that handrails are as long as the stairways.  Check any carpeting to make sure that it is firmly attached to the stairs. Fix any carpet that is loose or worn.  Avoid having throw rugs at the top or bottom of the stairs. If you do have throw rugs, attach them to the floor with carpet tape.  Make sure that you have a light switch at the top of the stairs and the bottom of the stairs. If you do not have them, ask someone to add them for you. What else can I do to help prevent falls?  Wear shoes that:  Do not have high heels.  Have rubber bottoms.  Are comfortable and fit you well.  Are closed at the toe. Do not wear sandals.  If you use a stepladder:  Make sure that it is fully opened. Do not climb a closed stepladder.  Make sure that both sides of the stepladder are locked into place.  Ask someone to hold it for you, if possible.  Clearly mark and  make sure that you can see:  Any grab bars or handrails.  First and last steps.  Where the edge of each step is.  Use tools that help you move around (mobility aids) if they are needed. These include:  Canes.  Walkers.  Scooters.  Crutches.  Turn on the lights when you go into a dark area. Replace any light bulbs as soon as they burn out.  Set up your furniture so you have a clear path. Avoid moving your furniture around.  If any of your floors are uneven, fix them.  If there are any pets around you, be aware of where they are.  Review your medicines with your doctor. Some medicines can make you feel dizzy. This can increase your chance of falling. Ask your doctor what other things that you can do to help prevent falls. This information is not intended to replace advice given to you by your health care provider. Make sure you discuss any questions you have with your health care provider. Document Released: 10/20/2008 Document Revised: 06/01/2015 Document Reviewed: 01/28/2014 Elsevier Interactive Patient Education  2017 [employer].

## 2018-06-02 ENCOUNTER — Ambulatory Visit: Payer: Self-pay

## 2018-06-02 ENCOUNTER — Telehealth: Payer: Self-pay

## 2018-06-02 DIAGNOSIS — I129 Hypertensive chronic kidney disease with stage 1 through stage 4 chronic kidney disease, or unspecified chronic kidney disease: Secondary | ICD-10-CM

## 2018-06-02 DIAGNOSIS — E1122 Type 2 diabetes mellitus with diabetic chronic kidney disease: Secondary | ICD-10-CM

## 2018-06-02 DIAGNOSIS — N182 Chronic kidney disease, stage 2 (mild): Secondary | ICD-10-CM

## 2018-06-02 NOTE — Chronic Care Management (AMB) (Signed)
  Chronic Care Management   Outreach Note  06/02/2018 Name: Kelly Lambert MRN: [id number] DOB: Jan 06, 1951  Referred by: [name], MD Reason for referral : Chronic Care Management (CCM RN CM Telephone Follow Up)   An unsuccessful telephone outreach was attempted today. The patient was referred to the case management team by [name] MD for assistance with chronic care management and care coordination. I left a HIPAA compliant voice message requesting a return phone call.   Follow Up Plan: The CCM team will reach out to the patient again over the next 7-10 days.     Barb Merino, RN,CCM Care Management Coordinator [place] [facility]  Direct Phone: [phone]

## 2018-06-03 ENCOUNTER — Other Ambulatory Visit: Payer: Self-pay | Admitting: Internal Medicine

## 2018-06-04 ENCOUNTER — Ambulatory Visit (INDEPENDENT_AMBULATORY_CARE_PROVIDER_SITE_OTHER): Payer: Medicare PPO

## 2018-06-04 DIAGNOSIS — E1122 Type 2 diabetes mellitus with diabetic chronic kidney disease: Secondary | ICD-10-CM | POA: Diagnosis not present

## 2018-06-04 DIAGNOSIS — I129 Hypertensive chronic kidney disease with stage 1 through stage 4 chronic kidney disease, or unspecified chronic kidney disease: Secondary | ICD-10-CM | POA: Diagnosis not present

## 2018-06-04 DIAGNOSIS — N182 Chronic kidney disease, stage 2 (mild): Secondary | ICD-10-CM | POA: Diagnosis not present

## 2018-06-04 NOTE — Patient Instructions (Signed)
Visit Information  Goals Addressed      Patient Stated   . "I would like to review nutrtional information to help with meal planning" (pt-stated)       Current Barriers:  Marland Kitchen Knowledge Deficits related to disease process and Self Health management for Diabetes   Nurse Case Manager Clinical Goal(s):  Marland Kitchen Over the next 30 days, patient will work with Consulting civil engineer to address Diabetes disease process, Meal Planning and Knowing What to do for Hypo/Hyperglycemic events.   CCM RN CM Interventions:  Completed on 06/04/18: completed call with patient   . Return inbound call received from patient . Confirmed patient has received printed diabetes educational materials related to meal planning--patient admits he has not reviewed the materials received and would like a call back in a couple of weeks to further discuss the information provided . Patient denies having further questions or concerns at this time . Scheduled a CCM telephone Follow Up call with patient for about 2 weeks  Patient Self Care Activities:  Verbalizes understanding of the education/information provided today . Self administers medications as prescribed . Attends all scheduled provider appointments . Calls pharmacy for medication refills . Performs ADL's independently . Performs IADL's independently . Calls provider office for new concerns or questions  Please see past updates related to this goal by clicking on the "Past Updates" button in the selected goal        The patient verbalized understanding of instructions provided today and declined a print copy of patient instruction materials.   Telephone follow up appointment with care management team member scheduled for: 06/18/18  Barb Merino, [facility] Care Management Coordinator [place] [facility]  Direct Phone: [phone]

## 2018-06-04 NOTE — Chronic Care Management (AMB) (Signed)
  Chronic Care Management   Follow Up Note   06/04/2018 Name: Kelly Lambert MRN: [id number] DOB: [birth year]  Referred by: [name], MD Reason for referral : Chronic Care Management (CCM RN CM Telephone Follow Up )   Kelly Lambert is a 68 y.o. year old male who is a primary care patient of [name], MD. The CCM team was consulted for assistance with chronic disease management and care coordination needs.    Review of patient status, including review of consultants reports, relevant laboratory and other test results, and collaboration with appropriate care team members and the patient's provider was performed as part of comprehensive patient evaluation and provision of chronic care management services.    I spoke with Kelly Lambert by telephone today.   Goals Addressed      Patient Stated   . "I would like to review nutrtional information to help with meal planning" (pt-stated)       Current Barriers:  Marland Kitchen Knowledge Deficits related to disease process and Self Health management for Diabetes   Nurse Case Manager Clinical Goal(s):  Marland Kitchen Over the next 30 days, patient will work with Consulting civil engineer to address Diabetes disease process, Meal Planning and Knowing What to do for Hypo/Hyperglycemic events.   CCM RN CM Interventions:  Completed on 06/04/18: completed call with patient   . Return inbound call received from patient . Confirmed patient has received printed diabetes educational materials related to meal planning--patient admits he has not reviewed the materials received and would like a call back in a couple of weeks to further discuss the information provided . Patient denies having further questions or concerns at this time . Scheduled a CCM telephone Follow Up call with patient for about 2 weeks  Patient Self Care Activities:  Verbalizes understanding of the education/information provided today . Self administers medications as prescribed . Attends all  scheduled provider appointments . Calls pharmacy for medication refills . Performs ADL's independently . Performs IADL's independently . Calls provider office for new concerns or questions  Please see past updates related to this goal by clicking on the "Past Updates" button in the selected goal         Telephone follow up appointment with care management team member scheduled for:  06/18/18   Barb Merino, [facility] Care Management Coordinator [facility]  Direct Phone: [phone]

## 2018-06-09 ENCOUNTER — Telehealth: Payer: Self-pay

## 2018-06-18 ENCOUNTER — Telehealth: Payer: Self-pay

## 2018-06-18 NOTE — Progress Notes (Signed)
This encounter was created in error - please disregard.

## 2018-06-23 ENCOUNTER — Other Ambulatory Visit: Payer: Self-pay | Admitting: Internal Medicine

## 2018-06-25 ENCOUNTER — Encounter: Payer: Self-pay | Admitting: Internal Medicine

## 2018-06-25 ENCOUNTER — Ambulatory Visit: Payer: Self-pay

## 2018-07-02 ENCOUNTER — Encounter: Payer: Self-pay | Admitting: Internal Medicine

## 2018-07-02 ENCOUNTER — Ambulatory Visit: Payer: Medicare PPO

## 2018-07-02 ENCOUNTER — Ambulatory Visit (INDEPENDENT_AMBULATORY_CARE_PROVIDER_SITE_OTHER): Payer: Medicare HMO | Admitting: Internal Medicine

## 2018-07-02 ENCOUNTER — Other Ambulatory Visit: Payer: Self-pay

## 2018-07-02 VITALS — BP 120/72 | HR 73 | Temp 98.2°F | Ht 70.0 in | Wt 209.2 lb

## 2018-07-02 DIAGNOSIS — E1122 Type 2 diabetes mellitus with diabetic chronic kidney disease: Secondary | ICD-10-CM

## 2018-07-02 DIAGNOSIS — I129 Hypertensive chronic kidney disease with stage 1 through stage 4 chronic kidney disease, or unspecified chronic kidney disease: Secondary | ICD-10-CM | POA: Diagnosis not present

## 2018-07-02 DIAGNOSIS — Z0000 Encounter for general adult medical examination without abnormal findings: Secondary | ICD-10-CM

## 2018-07-02 DIAGNOSIS — N182 Chronic kidney disease, stage 2 (mild): Secondary | ICD-10-CM

## 2018-07-02 LAB — POCT UA - MICROALBUMIN
Albumin/Creatinine Ratio, Urine, POC: 30
Creatinine, POC: 100 mg/dL
Microalbumin Ur, POC: 10 mg/L

## 2018-07-02 LAB — POCT URINALYSIS DIPSTICK
Bilirubin, UA: NEGATIVE
Blood, UA: NEGATIVE
Glucose, UA: NEGATIVE
Ketones, UA: NEGATIVE
Leukocytes, UA: NEGATIVE
Nitrite, UA: NEGATIVE
Protein, UA: NEGATIVE
Spec Grav, UA: 1.02 (ref 1.010–1.025)
Urobilinogen, UA: 0.2 E.U./dL
pH, UA: 5.5 (ref 5.0–8.0)

## 2018-07-02 NOTE — Patient Instructions (Signed)

## 2018-07-03 ENCOUNTER — Telehealth: Payer: Self-pay

## 2018-07-03 LAB — CBC
Hematocrit: 41.4 % (ref 37.5–51.0)
Hemoglobin: 13.5 g/dL (ref 13.0–17.7)
MCH: 28.5 pg (ref 26.6–33.0)
MCHC: 32.6 g/dL (ref 31.5–35.7)
MCV: 87 fL (ref 79–97)
Platelets: 206 10*3/uL (ref 150–450)
RBC: 4.74 x10E6/uL (ref 4.14–5.80)
RDW: 14.7 % (ref 11.6–15.4)
WBC: 4 10*3/uL (ref 3.4–10.8)

## 2018-07-03 LAB — CMP14+EGFR
ALT: 13 IU/L (ref 0–44)
AST: 18 IU/L (ref 0–40)
Albumin/Globulin Ratio: 1.6 (ref 1.2–2.2)
Albumin: 4.4 g/dL (ref 3.8–4.8)
Alkaline Phosphatase: 85 IU/L (ref 39–117)
BUN/Creatinine Ratio: 12 (ref 10–24)
BUN: 13 mg/dL (ref 8–27)
Bilirubin Total: 0.3 mg/dL (ref 0.0–1.2)
CO2: 23 mmol/L (ref 20–29)
Calcium: 10.4 mg/dL — ABNORMAL HIGH (ref 8.6–10.2)
Chloride: 102 mmol/L (ref 96–106)
Creatinine, Ser: 1.08 mg/dL (ref 0.76–1.27)
GFR calc Af Amer: 82 mL/min/{1.73_m2} (ref >59)
GFR calc non Af Amer: 71 mL/min/{1.73_m2} (ref >59)
Globulin, Total: 2.8 g/dL (ref 1.5–4.5)
Glucose: 124 mg/dL — ABNORMAL HIGH (ref 65–99)
Potassium: 4.1 mmol/L (ref 3.5–5.2)
Sodium: 140 mmol/L (ref 134–144)
Total Protein: 7.2 g/dL (ref 6.0–8.5)

## 2018-07-03 LAB — LIPID PANEL
Chol/HDL Ratio: 3.8 ratio (ref 0.0–5.0)
Cholesterol, Total: 147 mg/dL (ref 100–199)
HDL: 39 mg/dL — ABNORMAL LOW (ref >39)
LDL Calculated: 79 mg/dL (ref 0–99)
Triglycerides: 147 mg/dL (ref 0–149)
VLDL Cholesterol Cal: 29 mg/dL (ref 5–40)

## 2018-07-03 LAB — HEMOGLOBIN A1C
Est. average glucose Bld gHb Est-mCnc: 154 mg/dL
Hgb A1c MFr Bld: 7 % — ABNORMAL HIGH (ref 4.8–5.6)

## 2018-07-04 NOTE — Progress Notes (Signed)
Subjective:     Patient ID: Kelly Lambert , male    DOB: [birth year] , 68 y.o.   MRN: [id number]   Chief Complaint  Patient presents with  . Annual Exam  . Diabetes  . Hypertension    HPI  He is here today for a full physical examination. He is followed by Urology for prostate exams. He has no specific concerns or complaints at this time.   Diabetes He presents for his follow-up diabetic visit. He has type 2 diabetes mellitus. There are no hypoglycemic associated symptoms. Pertinent negatives for diabetes include no blurred vision and no chest pain. There are no hypoglycemic complications. Diabetic complications include nephropathy. Risk factors for coronary artery disease include diabetes mellitus, dyslipidemia, hypertension and male sex. He is compliant with treatment most of the time. He is following a diabetic diet. Meal planning includes avoidance of concentrated sweets. He participates in exercise intermittently. His home blood glucose trend is fluctuating minimally. His breakfast blood glucose is taken between 9-10 am. His breakfast blood glucose range is generally 110-130 mg/dl. An ACE inhibitor/angiotensin II receptor blocker is being taken. Eye exam is current.  Hypertension This is a chronic problem. The current episode started more than 1 year ago. The problem has been gradually improving since onset. The problem is controlled. Pertinent negatives include no blurred vision, chest pain, palpitations or shortness of breath. Risk factors for coronary artery disease include diabetes mellitus, dyslipidemia and male gender. Past treatments include angiotensin blockers and diuretics. The current treatment provides moderate improvement. Compliance problems include exercise.      Past Medical History:  Diagnosis Date  . BPH (benign prostatic hyperplasia)   . Diabetes mellitus without complication ([place])   . Hyperlipidemia associated with type 2 diabetes mellitus ([place])   . Hypertension       Family History  Problem Relation Age of Onset  . Cancer Mother   . Cancer Father   . Cancer Brother      Current Outpatient Medications:  .  ACCU-CHEK FASTCLIX LANCETS MISC, Use as directed to check blood sugars 1 time per day dx: e11.65, Disp: 150 each, Rfl: 2 .  ACCU-CHEK GUIDE test strip, CHECK BLOOD SUGAR ONCE DAILY dx: e11.65, Disp: 150 each, Rfl: 2 .  amitriptyline (ELAVIL) 25 MG tablet, Take 25 mg by mouth at bedtime., Disp: , Rfl:  .  aspirin EC 81 MG tablet, Take 81 mg by mouth daily., Disp: , Rfl:  .  cyclobenzaprine (FLEXERIL) 5 MG tablet, TAKE 1 TABLET BY MOUTH EVERY NIGHT AT BEDTIME AS NEEDED, Disp: 30 tablet, Rfl: 0 .  fluticasone (FLONASE) 50 MCG/ACT nasal spray, Place 1 spray into both nostrils daily., Disp: 16 g, Rfl: 2 .  gabapentin (NEURONTIN) 100 MG capsule, Take 1 capsule (100 mg total) by mouth at bedtime., Disp: 90 capsule, Rfl: 0 .  metFORMIN (GLUCOPHAGE) 1000 MG tablet, TAKE 1 TABLET TWICE DAILY WITH MORNING AND EVENING MEALS, Disp: 180 tablet, Rfl: 0 .  pantoprazole (PROTONIX) 40 MG tablet, TAKE 1 TABLET EVERY DAY, Disp: 90 tablet, Rfl: 1 .  pravastatin (PRAVACHOL) 40 MG tablet, TAKE 1 TABLET EVERY DAY, Disp: 90 tablet, Rfl: 0 .  tamsulosin (FLOMAX) 0.4 MG CAPS capsule, TAKE 1 CAPSULE EVERY DAY 30 MINUTES AFTER THE SAME MEAL EACH DAY, Disp: 90 capsule, Rfl: 0 .  valsartan-hydrochlorothiazide (DIOVAN-HCT) 160-25 MG tablet, TAKE 1 TABLET EVERY DAY, Disp: 90 tablet, Rfl: 1   No Known Allergies   Men's preventive visit. Patient Health Questionnaire (PHQ-2) is  Clinical Support from 05/21/2018 in [facility]  PHQ-2 Total Score  0    . Patient is on a healthy diet. Marital status: Married. Relevant history for alcohol use is:  Social History   Substance and Sexual Activity  Alcohol Use Not Currently  . Relevant history for tobacco use is:  Social History   Tobacco Use  Smoking Status Former Smoker  . Packs/day: 0.50  . Years:  30.00  . Pack years: 15.00  . Types: Cigarettes  . Quit date: 1999  . Years since quitting: 21.5  Smokeless Tobacco Former Systems developer  . Types: Chew  . Quit date: 2018  .  Review of Systems  Constitutional: Negative.   HENT: Negative.   Eyes: Negative.  Negative for blurred vision.  Respiratory: Negative.  Negative for shortness of breath.   Cardiovascular: Negative.  Negative for chest pain and palpitations.  Gastrointestinal: Negative.   Endocrine: Negative.   Genitourinary: Negative.   Musculoskeletal: Negative.   Skin: Negative.   Allergic/Immunologic: Negative.   Neurological: Negative.   Hematological: Negative.   Psychiatric/Behavioral: Negative.      Today's Vitals   07/02/18 1029  BP: 120/72  Pulse: 73  Temp: 98.2 F (36.8 C)  TempSrc: Oral  Weight: 209 lb 3.2 oz (94.9 kg)  Height: _0  (1.778 m)  PainSc: 0-No pain   Body mass index is 30.02 kg/m.   Objective:  Physical Exam Vitals signs and nursing note reviewed.  Constitutional:      Appearance: Normal appearance.  HENT:     Head: Normocephalic and atraumatic.     Right Ear: Tympanic membrane, ear canal and external ear normal.     Left Ear: Tympanic membrane, ear canal and external ear normal.     Nose: Nose normal.     Mouth/Throat:     Mouth: Mucous membranes are moist.     Pharynx: Oropharynx is clear.  Eyes:     Extraocular Movements: Extraocular movements intact.     Conjunctiva/sclera: Conjunctivae normal.     Pupils: Pupils are equal, round, and reactive to light.  Neck:     Musculoskeletal: Normal range of motion and neck supple.  Cardiovascular:     Rate and Rhythm: Normal rate and regular rhythm.     Pulses: Normal pulses.          Dorsalis pedis pulses are 2+ on the right side and 2+ on the left side.       Posterior tibial pulses are 2+ on the right side and 2+ on the left side.     Heart sounds: Normal heart sounds.  Pulmonary:     Effort: Pulmonary effort is normal.     Breath  sounds: Normal breath sounds.  Chest:     Breasts:        Right: Normal. No swelling, bleeding, inverted nipple, mass or nipple discharge.        Left: Normal. No swelling, bleeding, inverted nipple, mass or nipple discharge.  Abdominal:     General: Abdomen is flat. Bowel sounds are normal.     Palpations: Abdomen is soft.  Genitourinary:    Comments: deferred Musculoskeletal: Normal range of motion.  Feet:     Right foot:     Protective Sensation: 5 sites tested. 5 sites sensed.     Skin integrity: Skin integrity normal.     Toenail Condition: Right toenails are normal.     Left foot:     Protective Sensation: 5 sites  tested. 5 sites sensed.     Skin integrity: Skin integrity normal.     Toenail Condition: Left toenails are normal.  Skin:    General: Skin is warm.  Neurological:     General: No focal deficit present.     Mental Status: He is alert.  Psychiatric:        Mood and Affect: Mood normal.        Behavior: Behavior normal.         Assessment And Plan:     1. Routine general medical examination at health care facility  A full exam was performed. DRE deferred. PATIENT HAS BEEN ADVISED TO GET 30-45 MINUTES REGULAR EXERCISE NO LESS THAN FOUR TO FIVE DAYS PER WEEK - BOTH WEIGHTBEARING EXERCISES AND AEROBIC ARE RECOMMENDED.  HE IS ADVISED TO FOLLOW A HEALTHY DIET WITH AT LEAST SIX FRUITS/VEGGIES PER DAY, DECREASE INTAKE OF RED MEAT, AND TO INCREASE FISH INTAKE TO TWO DAYS PER WEEK.  MEATS/FISH SHOULD NOT BE FRIED, BAKED OR BROILED IS PREFERABLE.  I SUGGEST WEARING SPF 50 SUNSCREEN ON EXPOSED PARTS AND ESPECIALLY WHEN IN THE DIRECT SUNLIGHT FOR AN EXTENDED PERIOD OF TIME.  PLEASE AVOID FAST FOOD RESTAURANTS AND INCREASE YOUR WATER INTAKE.   2. Type 2 diabetes mellitus with stage 2 chronic kidney disease, without long-term current use of insulin ([place])  Diabetic foot exam was performed. I DISCUSSED WITH THE PATIENT AT LENGTH REGARDING THE GOALS OF GLYCEMIC CONTROL AND  POSSIBLE LONG-TERM COMPLICATIONS.  I  ALSO STRESSED THE IMPORTANCE OF COMPLIANCE WITH HOME GLUCOSE MONITORING, DIETARY RESTRICTIONS INCLUDING AVOIDANCE OF SUGARY DRINKS/PROCESSED FOODS,  ALONG WITH REGULAR EXERCISE.  I  ALSO STRESSED THE IMPORTANCE OF ANNUAL EYE EXAMS, SELF FOOT CARE AND COMPLIANCE WITH OFFICE VISITS.  - CMP14+EGFR - CBC - Lipid panel - Hemoglobin A1c - POCT Urinalysis Dipstick (81002) - POCT UA - Microalbumin  3. Hypertensive nephropathy  Well controlled. He will continue with current meds. EKG performed, no acute changes noted. He is encouraged to limit his salt intake. He will rto in six months for re-evaluation.   - EKG 12-Lead        Maximino Greenland, MD    THE PATIENT IS ENCOURAGED TO PRACTICE SOCIAL DISTANCING DUE TO THE COVID-19 PANDEMIC.

## 2018-07-23 ENCOUNTER — Telehealth: Payer: Self-pay

## 2018-07-27 ENCOUNTER — Other Ambulatory Visit: Payer: Self-pay | Admitting: Nurse Practitioner

## 2018-07-27 MED ORDER — GABAPENTIN 100 MG PO CAPS: 100.0000 mg | ORAL_CAPSULE | Freq: Every day | ORAL | 0 refills | Status: DC

## 2018-08-10 ENCOUNTER — Telehealth: Payer: Self-pay

## 2018-09-01 ENCOUNTER — Other Ambulatory Visit: Payer: Self-pay | Admitting: Internal Medicine

## 2018-09-03 LAB — FECAL OCCULT BLOOD, IMMUNOCHEMICAL: IFOBT: NEGATIVE

## 2018-09-08 ENCOUNTER — Telehealth: Payer: Self-pay

## 2018-10-09 DIAGNOSIS — E119 Type 2 diabetes mellitus without complications: Secondary | ICD-10-CM | POA: Diagnosis not present

## 2018-10-09 LAB — HM DIABETES EYE EXAM

## 2018-10-12 ENCOUNTER — Other Ambulatory Visit: Payer: Self-pay

## 2018-10-12 MED ORDER — METFORMIN HCL 1000 MG PO TABS: ORAL_TABLET | ORAL | 0 refills | Status: DC

## 2018-10-12 MED ORDER — GABAPENTIN 100 MG PO CAPS: 100.0000 mg | ORAL_CAPSULE | Freq: Every day | ORAL | 0 refills | Status: DC

## 2018-10-12 MED ORDER — PRAVASTATIN SODIUM 40 MG PO TABS: 40.0000 mg | ORAL_TABLET | Freq: Every day | ORAL | 0 refills | Status: DC

## 2018-10-14 ENCOUNTER — Encounter: Payer: Self-pay | Admitting: Internal Medicine

## 2018-10-21 ENCOUNTER — Telehealth: Payer: Self-pay

## 2018-11-11 ENCOUNTER — Ambulatory Visit (INDEPENDENT_AMBULATORY_CARE_PROVIDER_SITE_OTHER): Payer: Medicare HMO | Admitting: Internal Medicine

## 2018-11-11 ENCOUNTER — Encounter: Payer: Self-pay | Admitting: Internal Medicine

## 2018-11-11 ENCOUNTER — Other Ambulatory Visit: Payer: Self-pay

## 2018-11-11 VITALS — BP 134/86 | HR 84 | Temp 97.8°F | Ht 70.0 in | Wt 219.4 lb

## 2018-11-11 DIAGNOSIS — R252 Cramp and spasm: Secondary | ICD-10-CM

## 2018-11-11 DIAGNOSIS — I129 Hypertensive chronic kidney disease with stage 1 through stage 4 chronic kidney disease, or unspecified chronic kidney disease: Secondary | ICD-10-CM | POA: Diagnosis not present

## 2018-11-11 DIAGNOSIS — Z6831 Body mass index (BMI) 31.0-31.9, adult: Secondary | ICD-10-CM | POA: Diagnosis not present

## 2018-11-11 DIAGNOSIS — N182 Chronic kidney disease, stage 2 (mild): Secondary | ICD-10-CM

## 2018-11-11 DIAGNOSIS — E6609 Other obesity due to excess calories: Secondary | ICD-10-CM

## 2018-11-11 DIAGNOSIS — E1122 Type 2 diabetes mellitus with diabetic chronic kidney disease: Secondary | ICD-10-CM | POA: Diagnosis not present

## 2018-11-11 NOTE — Patient Instructions (Signed)
Muscle Cramps and Spasms Muscle cramps and spasms are when muscles tighten by themselves. They usually get better within minutes. Muscle cramps are painful. They are usually stronger and last longer than muscle spasms. Muscle spasms may or may not be painful. They can last a few seconds or much longer. Cramps and spasms can affect any muscle, but they occur most often in the calf muscles of the leg. They are usually not caused by a serious problem. In many cases, the cause is not known. Some common causes include:  Doing more physical work or exercise than your body is ready for.  Using the muscles too much (overuse) by repeating certain movements too many times.  Staying in a certain position for a long time.  Playing a sport or doing an activity without preparing properly.  Using bad form or technique while playing a sport or doing an activity.  Not having enough water in your body (dehydration).  Injury.  Side effects of some medicines.  Low levels of the salts and minerals in your blood (electrolytes), such as low potassium or calcium. Follow these instructions at home: Managing pain and stiffness      Massage, stretch, and relax the muscle. Do this for many minutes at a time.  If told, put heat on tight or tense muscles as often as told by your doctor. Use the heat source that your doctor recommends, such as a moist heat pack or a heating pad. ? Place a towel between your skin and the heat source. ? Leave the heat on for 20-30 minutes. ? Remove the heat if your skin turns bright red. This is very important if you are not able to feel pain, heat, or cold. You may have a greater risk of getting burned.  If told, put ice on the affected area. This may help if you are sore or have pain after a cramp or spasm. ? Put ice in a plastic bag. ? Place a towel between your skin and the bag. ? Leave the ice on for 20 minutes, 2-3 times a day.  Try taking hot showers or baths to help  relax tight muscles. Eating and drinking  Drink enough fluid to keep your pee (urine) pale yellow.  Eat a healthy diet to help ensure that your muscles work well. This should include: ? Fruits and vegetables. ? Lean protein. ? Whole grains. ? Low-fat or nonfat dairy products. General instructions  If you are having cramps often, avoid intense exercise for several days.  Take over-the-counter and prescription medicines only as told by your doctor.  Watch for any changes in your symptoms.  Keep all follow-up visits as told by your doctor. This is important. Contact a doctor if:  Your cramps or spasms get worse or happen more often.  Your cramps or spasms do not get better with time. Summary  Muscle cramps and spasms are when muscles tighten by themselves. They usually get better within minutes.  Cramps and spasms occur most often in the calf muscles of the leg.  Massage, stretch, and relax the muscle. This may help the cramp or spasm go away.  Drink enough fluid to keep your pee (urine) pale yellow. This information is not intended to replace advice given to you by your health care provider. Make sure you discuss any questions you have with your health care provider. Document Released: 12/07/2007 Document Revised: 05/19/2017 Document Reviewed: 05/19/2017 Elsevier Patient Education  2020 Else[employer] Inc[employer]  

## 2018-11-12 LAB — BMP8+EGFR
BUN/Creatinine Ratio: 12 (ref 10–24)
BUN: 15 mg/dL (ref 8–27)
CO2: 23 mmol/L (ref 20–29)
Calcium: 10.5 mg/dL — ABNORMAL HIGH (ref 8.6–10.2)
Chloride: 104 mmol/L (ref 96–106)
Creatinine, Ser: 1.22 mg/dL (ref 0.76–1.27)
GFR calc Af Amer: 70 mL/min/{1.73_m2} (ref >59)
GFR calc non Af Amer: 61 mL/min/{1.73_m2} (ref >59)
Glucose: 164 mg/dL — ABNORMAL HIGH (ref 65–99)
Potassium: 4.8 mmol/L (ref 3.5–5.2)
Sodium: 142 mmol/L (ref 134–144)

## 2018-11-12 LAB — HEMOGLOBIN A1C
Est. average glucose Bld gHb Est-mCnc: 180 mg/dL
Hgb A1c MFr Bld: 7.9 % — ABNORMAL HIGH (ref 4.8–5.6)

## 2018-11-12 LAB — MAGNESIUM: Magnesium: 2.3 mg/dL (ref 1.6–2.3)

## 2018-11-13 ENCOUNTER — Encounter: Payer: Self-pay | Admitting: Internal Medicine

## 2018-11-13 ENCOUNTER — Telehealth: Payer: Self-pay

## 2018-11-13 NOTE — Telephone Encounter (Signed)
Left the patient a message to call back for lab results. 

## 2018-11-13 NOTE — Telephone Encounter (Signed)
-----   Message from [name], MD sent at 11/12/2018 10:26 PM EST ----- Here are your lab results:  Your hba1c is 7.9, up from last visit. Please resume regular exercise and cut out sodas and juices.   Your kidney fxn is stable. However, your calcium level is elevated. I would like for you to come in next week for additional labwork. I would like to check your parathyroid hormone level to further evaluate the elevated calcium level.    This could be due to slightly dehydration, but I want to check PTH to ensure no further workup is needed. Please let me know if you have any questions or concerns.    Sincerely,    Robyn N. Baird [facility], MD

## 2018-11-14 ENCOUNTER — Encounter: Payer: Self-pay | Admitting: Internal Medicine

## 2018-11-15 NOTE — Progress Notes (Signed)
Subjective:     Patient ID: Kelly Lambert , male    DOB: 1950/01/16 , 68 y.o.   MRN: [id number]   Chief Complaint  Patient presents with  . Diabetes  . Hypertension    HPI  Diabetes He presents for his follow-up diabetic visit. He has type 2 diabetes mellitus. There are no hypoglycemic associated symptoms. Pertinent negatives for diabetes include no blurred vision and no chest pain. There are no hypoglycemic complications. Diabetic complications include nephropathy. Risk factors for coronary artery disease include diabetes mellitus, dyslipidemia, hypertension and male sex. He is compliant with treatment most of the time. He is following a diabetic diet. Meal planning includes avoidance of concentrated sweets. He participates in exercise intermittently. His home blood glucose trend is fluctuating minimally. His breakfast blood glucose is taken between 9-10 am. His breakfast blood glucose range is generally 110-130 mg/dl. An ACE inhibitor/angiotensin II receptor blocker is being taken. Eye exam is current.  Hypertension This is a chronic problem. The current episode started more than 1 year ago. The problem has been gradually improving since onset. The problem is controlled. Pertinent negatives include no blurred vision, chest pain, palpitations or shortness of breath. Risk factors for coronary artery disease include diabetes mellitus, dyslipidemia and male gender. Past treatments include angiotensin blockers and diuretics. The current treatment provides moderate improvement. Compliance problems include exercise.  Hypertensive end-organ damage includes kidney disease.     Past Medical History:  Diagnosis Date  . BPH (benign prostatic hyperplasia)   . Diabetes mellitus without complication ([place])   . Hyperlipidemia associated with type 2 diabetes mellitus ([place])   . Hypertension      Family History  Problem Relation Age of Onset  . Cancer Mother   . Cancer Father   . Cancer Brother       Current Outpatient Medications:  .  ACCU-CHEK FASTCLIX LANCETS MISC, Use as directed to check blood sugars 1 time per day dx: e11.65, Disp: 150 each, Rfl: 2 .  ACCU-CHEK GUIDE test strip, CHECK BLOOD SUGAR ONCE DAILY dx: e11.65, Disp: 150 each, Rfl: 2 .  aspirin EC 81 MG tablet, Take 81 mg by mouth daily., Disp: , Rfl:  .  cyclobenzaprine (FLEXERIL) 5 MG tablet, TAKE 1 TABLET BY MOUTH EVERY NIGHT AT BEDTIME AS NEEDED, Disp: 30 tablet, Rfl: 0 .  fluticasone (FLONASE) 50 MCG/ACT nasal spray, Place 1 spray into both nostrils daily., Disp: 16 g, Rfl: 2 .  gabapentin (NEURONTIN) 100 MG capsule, Take 1 capsule (100 mg total) by mouth at bedtime., Disp: 90 capsule, Rfl: 0 .  metFORMIN (GLUCOPHAGE) 1000 MG tablet, TAKE 1 TABLET TWICE DAILY WITH MORNING AND EVENING MEALS, Disp: 180 tablet, Rfl: 0 .  pantoprazole (PROTONIX) 40 MG tablet, TAKE 1 TABLET EVERY DAY, Disp: 90 tablet, Rfl: 1 .  pravastatin (PRAVACHOL) 40 MG tablet, Take 1 tablet (40 mg total) by mouth daily., Disp: 90 tablet, Rfl: 0 .  tamsulosin (FLOMAX) 0.4 MG CAPS capsule, TAKE 1 CAPSULE EVERY DAY 30 MINUTES AFTER THE SAME MEAL EACH DAY, Disp: 90 capsule, Rfl: 0 .  valsartan-hydrochlorothiazide (DIOVAN-HCT) 160-25 MG tablet, TAKE 1 TABLET EVERY DAY, Disp: 90 tablet, Rfl: 1   No Known Allergies   Review of Systems  Constitutional: Negative.   Eyes: Negative for blurred vision.  Respiratory: Negative.  Negative for shortness of breath.   Cardiovascular: Negative.  Negative for chest pain and palpitations.  Gastrointestinal: Negative.   Musculoskeletal: Positive for myalgias.       He  c/o muscle cramps. Sx started about 2 weeks ago. He is not sure what may have triggered his sx. He reports drinking "plenty" of water daily.   Neurological: Negative.   Psychiatric/Behavioral: Negative.      Today's Vitals   11/11/18 1149  BP: 134/86  Pulse: 84  Temp: 97.8 F (36.6 C)  TempSrc: Oral  Weight: 219 lb 6.4 oz (99.5 kg)  Height:  5' 10" (1.778 m)  PainSc: 0-No pain   Body mass index is 31.48 kg/m.   Objective:  Physical Exam Vitals signs and nursing note reviewed.  Constitutional:      Appearance: Normal appearance.  Cardiovascular:     Rate and Rhythm: Normal rate and regular rhythm.     Heart sounds: Normal heart sounds.  Pulmonary:     Effort: Pulmonary effort is normal.     Breath sounds: Normal breath sounds.  Skin:    General: Skin is warm.  Neurological:     General: No focal deficit present.     Mental Status: He is alert.  Psychiatric:        Mood and Affect: Mood normal.         Assessment And Plan:     1. Type 2 diabetes mellitus with stage 2 chronic kidney disease, without long-term current use of insulin ([place])  I will check labs as listed below. Importance of regular exercise was discussed with the patient. He will rto in four months for re-evaluation.   - Hemoglobin A1c - BMP8+EGFR  2. Hypertensive nephropathy  Chronic, fair control. He will continue with current meds. He is encouraged to avoid adding salt to his foods.   3. Muscle cramp  Intermittent. He is advised to start nightly magnesium supplementation. He is advised to start with 250-485m and that he may get this OTC. I will also check serum magnesium level.   - Magnesium  4. Class 1 obesity due to excess calories without serious comorbidity with body mass index (BMI) of 31.0 to 31.9 in adult  He was made aware of 10 pound weight gain since June 2020.  He is encouraged to cut out candies and desserts. He is also encouraged to incorporate more exercise into his daily routine.   RMaximino Greenland MD    THE PATIENT IS ENCOURAGED TO PRACTICE SOCIAL DISTANCING DUE TO THE COVID-19 PANDEMIC.

## 2018-11-16 ENCOUNTER — Encounter: Payer: Self-pay | Admitting: Internal Medicine

## 2018-11-18 ENCOUNTER — Other Ambulatory Visit: Payer: Self-pay

## 2018-11-18 MED ORDER — ONETOUCH VERIO VI STRP: ORAL_STRIP | 2 refills | Status: DC

## 2018-11-23 ENCOUNTER — Other Ambulatory Visit: Payer: Self-pay

## 2018-11-23 ENCOUNTER — Other Ambulatory Visit: Payer: Medicare HMO

## 2018-11-23 DIAGNOSIS — E875 Hyperkalemia: Secondary | ICD-10-CM

## 2018-11-25 ENCOUNTER — Other Ambulatory Visit: Payer: Self-pay

## 2018-11-25 ENCOUNTER — Telehealth: Payer: Self-pay

## 2018-11-25 ENCOUNTER — Ambulatory Visit (INDEPENDENT_AMBULATORY_CARE_PROVIDER_SITE_OTHER): Payer: Medicare HMO

## 2018-11-25 DIAGNOSIS — N182 Chronic kidney disease, stage 2 (mild): Secondary | ICD-10-CM | POA: Diagnosis not present

## 2018-11-25 DIAGNOSIS — E1122 Type 2 diabetes mellitus with diabetic chronic kidney disease: Secondary | ICD-10-CM

## 2018-11-25 DIAGNOSIS — I129 Hypertensive chronic kidney disease with stage 1 through stage 4 chronic kidney disease, or unspecified chronic kidney disease: Secondary | ICD-10-CM

## 2018-11-25 LAB — PTH, INTACT AND CALCIUM
Calcium: 10.4 mg/dL — ABNORMAL HIGH (ref 8.6–10.2)
PTH: 41 pg/mL (ref 15–65)

## 2018-11-26 NOTE — Chronic Care Management (AMB) (Signed)
Chronic Care Management   Follow Up Note   11/25/2018 Name: Kelly Lambert MRN: [id number] DOB: 03/15/1950  Referred by: [name], MD Reason for referral : Chronic Care Management (CCM RNCM Telephone Follow up )   Kelly Lambert is a 68 y.o. year old male who is a primary care patient of [name], MD. The CCM team was consulted for assistance with chronic disease management and care coordination needs.    Review of patient status, including review of consultants reports, relevant laboratory and other test results, and collaboration with appropriate care team members and the patient's provider was performed as part of comprehensive patient evaluation and provision of chronic care management services.    SDOH (Social Determinants of Health) screening performed today: None. See Care Plan for related entries.   Placed outbound call to Kelly Lambert for a CCM RN CM follow up on his DM.   Outpatient Encounter Medications as of 11/25/2018  Medication Sig  . ACCU-CHEK FASTCLIX LANCETS MISC Use as directed to check blood sugars 1 time per day dx: e11.65  . aspirin EC 81 MG tablet Take 81 mg by mouth daily.  . cyclobenzaprine (FLEXERIL) 5 MG tablet TAKE 1 TABLET BY MOUTH EVERY NIGHT AT BEDTIME AS NEEDED  . fluticasone (FLONASE) 50 MCG/ACT nasal spray Place 1 spray into both nostrils daily.  Marland Kitchen gabapentin (NEURONTIN) 100 MG capsule Take 1 capsule (100 mg total) by mouth at bedtime.  Marland Kitchen glucose blood (ONETOUCH VERIO) test strip Use as instructed to check blood sugars one time per day dx: e11.65  . metFORMIN (GLUCOPHAGE) 1000 MG tablet TAKE 1 TABLET TWICE DAILY WITH MORNING AND EVENING MEALS  . pantoprazole (PROTONIX) 40 MG tablet TAKE 1 TABLET EVERY DAY  . pravastatin (PRAVACHOL) 40 MG tablet Take 1 tablet (40 mg total) by mouth daily.  . tamsulosin (FLOMAX) 0.4 MG CAPS capsule TAKE 1 CAPSULE EVERY DAY 30 MINUTES AFTER THE SAME MEAL EACH DAY  . valsartan-hydrochlorothiazide  (DIOVAN-HCT) 160-25 MG tablet TAKE 1 TABLET EVERY DAY   No facility-administered encounter medications on file as of 11/25/2018.      Goals Addressed      Patient Stated   . "I would like to review nutrtional information to help with meal planning" (pt-stated)       Current Barriers:  Marland Kitchen Knowledge Deficits related to disease process and Self Health management for Diabetes   Nurse Case Manager Clinical Goal(s):  Marland Kitchen Over the next 30 days, patient will work with Consulting civil engineer to address Diabetes disease process, Meal Planning and Knowing What to do for Hypo/Hyperglycemic events. Goal Met . New - 11/25/18 - Over the next 30 days, patient will collaborate the the embedded Pharm D Lottie Dawson for comprehensive medication review and to evaluate current diabetic pharmacy regimen . New - 11/25/18 - Over the next 90 days, patient will have improved daily glycemic control (80-130) and will have lowered his A1C <7.9  CCM RN CM Interventions:  11/25/18 completed call with patient   . Evaluation of current treatment plan related to Diabetes and patient's adherence to plan as established by provider. . Provided education to patient re: recent A1C has increased to 7.9 obtained on 11/11/18; discussed this is a jump from 6.9 from 1 year ago; discussed target A1C; Reinforced following ADA recommendations diet, following a low carb diabetic friendly diet; patient admits to eating hot dogs and drinking soda; discussed mailing patient resources for review of Meal planning using the plate method with portion control  and low Carb food choices; discussed recommendations for implementing daily exercise (150 mins per week)  . Reviewed medications with patient and discussed patient is adhering to taking his Metformin bid as directed w/o missed doses . Collaborated with embedded Pharm D Lottie Dawson regarding outreach to patient needed to evaluate current pharmacological treatment of DM . Discussed plans with patient  for ongoing care management follow up and provided patient with direct contact information for care management team . Provided patient with printed educational materials related to Meal Planning using the Plate Method and Portion Control; s/s hypo/hyperglycemia; Carb Choices, Carb Counting, Diabetes Management, Losing Weight: how to get started . Advised patient, providing education and rationale, to check cbg daily before meals and record, calling the PCP and or CCM team for findings outside established parameters.  <80 and or >200   Patient Self Care Activities:  . Self administers medications as prescribed . Attends all scheduled provider appointments . Calls pharmacy for medication refills . Performs ADL's independently . Performs IADL's independently . Calls provider office for new concerns or questions  Please see past updates related to this goal by clicking on the "Past Updates" button in the selected goal         Telephone follow up appointment with care management team member scheduled for: 12/25/18  Barb Merino, RN, BSN, CCM Care Management Coordinator [facility] [facility]  Direct Phone: [phone]

## 2018-11-26 NOTE — Patient Instructions (Signed)
Visit Information  Goals Addressed      Patient Stated   . "I would like to review nutrtional information to help with meal planning" (pt-stated)       Current Barriers:  Marland Kitchen Knowledge Deficits related to disease process and Self Health management for Diabetes   Nurse Case Manager Clinical Goal(s):  Marland Kitchen Over the next 30 days, patient will work with Consulting civil engineer to address Diabetes disease process, Meal Planning and Knowing What to do for Hypo/Hyperglycemic events. Goal Met . New - 11/25/18 - Over the next 30 days, patient will collaborate the the embedded Pharm D Lottie Dawson for comprehensive medication review and to evaluate current diabetic pharmacy regimen . New - 11/25/18 - Over the next 90 days, patient will have improved daily glycemic control (80-130) and will have lowered his A1C <7.9  CCM RN CM Interventions:  11/25/18 completed call with patient   . Evaluation of current treatment plan related to Diabetes and patient's adherence to plan as established by provider. . Provided education to patient re: recent A1C has increased to 7.9 obtained on 11/11/18; discussed this is a jump from 6.9 from 1 year ago; discussed target A1C; Reinforced following ADA recommendations diet, following a low carb diabetic friendly diet; patient admits to eating hot dogs and drinking soda; discussed mailing patient resources for review of Meal planning using the plate method with portion control and low Carb food choices; discussed recommendations for implementing daily exercise (150 mins per week)  . Reviewed medications with patient and discussed patient is adhering to taking his Metformin bid as directed w/o missed doses . Collaborated with embedded Pharm D Lottie Dawson regarding outreach to patient needed to evaluate current pharmacological treatment of DM . Discussed plans with patient for ongoing care management follow up and provided patient with direct contact information for care management  team . Provided patient with printed educational materials related to Meal Planning using the Plate Method and Portion Control; s/s hypo/hyperglycemia; Carb Choices, Carb Counting, Diabetes Management, Losing Weight: how to get started . Advised patient, providing education and rationale, to check cbg daily before meals and record, calling the PCP and or CCM team for findings outside established parameters.  <80 and or >200  Patient Self Care Activities:  . Self administers medications as prescribed . Attends all scheduled provider appointments . Calls pharmacy for medication refills . Performs ADL's independently . Performs IADL's independently . Calls provider office for new concerns or questions  Please see past updates related to this goal by clicking on the "Past Updates" button in the selected goal        The patient verbalized understanding of instructions provided today and declined a print copy of patient instruction materials.   Telephone follow up appointment with care management team member scheduled for: 12/25/18  Barb Merino, RN, BSN, CCM Care Management Coordinator [place] [facility]  Direct Phone: [phone]

## 2018-11-30 ENCOUNTER — Other Ambulatory Visit: Payer: Self-pay

## 2018-11-30 MED ORDER — PANTOPRAZOLE SODIUM 40 MG PO TBEC: 40.0000 mg | DELAYED_RELEASE_TABLET | Freq: Every day | ORAL | 1 refills | Status: DC

## 2018-11-30 MED ORDER — TAMSULOSIN HCL 0.4 MG PO CAPS: ORAL_CAPSULE | ORAL | 1 refills | Status: DC

## 2018-11-30 MED ORDER — VALSARTAN-HYDROCHLOROTHIAZIDE 160-25 MG PO TABS: 1.0000 | ORAL_TABLET | Freq: Every day | ORAL | 1 refills | Status: DC

## 2018-11-30 NOTE — Telephone Encounter (Signed)
The pt was asked to confirm his blood pressure medication because [facility] sent a refill request for losartan / hctz and the pt has valsartan/ hctz.  The pt said that he takes the valsartan/hctz and that the [place] representative was having a hard time understanding the pt.

## 2018-12-08 ENCOUNTER — Ambulatory Visit: Payer: Self-pay | Admitting: Pharmacist

## 2018-12-08 DIAGNOSIS — N182 Chronic kidney disease, stage 2 (mild): Secondary | ICD-10-CM

## 2018-12-08 DIAGNOSIS — E1122 Type 2 diabetes mellitus with diabetic chronic kidney disease: Secondary | ICD-10-CM

## 2018-12-08 DIAGNOSIS — I129 Hypertensive chronic kidney disease with stage 1 through stage 4 chronic kidney disease, or unspecified chronic kidney disease: Secondary | ICD-10-CM

## 2018-12-08 NOTE — Progress Notes (Signed)
  Chronic Care Management   Outreach Note  12/08/2018 Name: Kelly Lambert MRN: [id number] DOB: 06/02/1950  Referred by: [name], MD Reason for referral : Chronic Care Management   An unsuccessful telephone outreach was attempted today. The patient was referred to the case management team by for assistance with care management and care coordination.   Follow Up Plan: A HIPPA compliant phone message was left for the patient providing contact information and requesting a return call.  The care management team will reach out to the patient again over the next 10-14 days.   SIGNATURE Regina Eck, PharmD, BCPS Clinical Pharmacist, [facility] [place]: [phone]

## 2018-12-10 ENCOUNTER — Ambulatory Visit: Payer: Self-pay | Admitting: Pharmacist

## 2018-12-10 NOTE — Progress Notes (Signed)
  Chronic Care Management   Outreach Note  12/10/2018 Name: Kelly Lambert MRN: [id number] DOB: 02/09/1950  Referred by: [name], MD Reason for referral : Chronic Care Management   An unsuccessful telephone outreach was attempted today. The patient was referred to the case management team by for assistance with care management and care coordination.   Follow Up Plan: A HIPPA compliant phone message was left for the patient providing contact information and requesting a return call.  The care management team will reach out to the patient again over the next 7 days.   SIGNATURE .Regina Eck, PharmD, BCPS Clinical Pharmacist, [facility] [place]: [phone]

## 2018-12-11 ENCOUNTER — Ambulatory Visit (INDEPENDENT_AMBULATORY_CARE_PROVIDER_SITE_OTHER): Payer: Medicare HMO | Admitting: Pharmacist

## 2018-12-11 DIAGNOSIS — N182 Chronic kidney disease, stage 2 (mild): Secondary | ICD-10-CM | POA: Diagnosis not present

## 2018-12-11 DIAGNOSIS — E1122 Type 2 diabetes mellitus with diabetic chronic kidney disease: Secondary | ICD-10-CM | POA: Diagnosis not present

## 2018-12-14 ENCOUNTER — Telehealth: Payer: Self-pay

## 2018-12-15 NOTE — Patient Instructions (Signed)
Visit Information  Goals Addressed            This Visit's Progress     Patient Stated   . I would like to continue optimize my diabetes management (pt-stated)       Current Barriers:  . Diabetes: 123456; complicated by chronic medical conditions including CKD, HTN, most recent A1c 7.9% (was 7% on 07/02/18) . Current antihyperglycemic regimen: metformin 1G BID o Patient wanted to discuss other therapies for diabetes management o Discussed potential transition to GLP1 therapy (SQ vs. PO) o Patient voiced interest in GLP1 therapy and will take some time to research and decide before next PCP appt . Denies hypoglycemic symptoms; denies hyperglycemic symptoms . Current meal patterns: discusses plate method and reducing carbohydrate intake; avoiding sugary drinks . Current exercise: n/a-states winter temps make exercise challenging . Current blood glucose readings: FBG 110-130 in the AM before eating . Cardiovascular risk reduction: o Current hypertensive regimen: valsartan/HCTZ o Current hyperlipidemia regimen: pravastatin o Current antiplatelet regimen: n/a  Pharmacist Clinical Goal(s):  Marland Kitchen Over the next 90 days, patient will work with PharmD and primary care provider to address needs related to optimization of diabetes management  Interventions: . Comprehensive medication review performed, medication list updated in electronic medical record . Reviewed & discussed the following diabetes-related information with patient: o Follow ADA recommended "diabetes-friendly" diet  (reviewed healthy snack/food options) o Reviewed medication purpose/side effects-->patient denies adverse events  Patient Self Care Activities:  . Patient will check blood glucose 3x weekly , document, and provide at future appointments . Patient will focus on medication adherence by continuing to take medication as prescribed . Patient will take medications as prescribed . Patient will contact provider with any  episodes of hypoglycemia . Patient will report any questions or concerns to provider   Initial goal documentation        The patient verbalized understanding of instructions provided today and declined a print copy of patient instruction materials.   The care management team will reach out to the patient again over the next 6-8 weeks  SIGNATURE Regina Eck, PharmD, [place] Pharmacist, [place]: [phone]

## 2018-12-15 NOTE — Progress Notes (Signed)
Chronic Care Management   Initial Visit Note  12/11/2018 Name: Kelly Lambert MRN: [id number] DOB: 06-01-1950  Referred by: [name], MD Reason for referral : Chronic Care Management   Kelly Lambert is a 68 y.o. year old male who is a primary care patient of [name], MD. The CCM team was consulted for assistance with chronic disease management and care coordination needs related to DMII  Review of patient status, including review of consultants reports, relevant laboratory and other test results, and collaboration with appropriate care team members and the patient's provider was performed as part of comprehensive patient evaluation and provision of chronic care management services.    I spoke with Kelly Lambert by telephone today.  Medications: Outpatient Encounter Medications as of 12/11/2018  Medication Sig  . ACCU-CHEK FASTCLIX LANCETS MISC Use as directed to check blood sugars 1 time per day dx: e11.65  . aspirin EC 81 MG tablet Take 81 mg by mouth daily.  . cyclobenzaprine (FLEXERIL) 5 MG tablet TAKE 1 TABLET BY MOUTH EVERY NIGHT AT BEDTIME AS NEEDED  . fluticasone (FLONASE) 50 MCG/ACT nasal spray Place 1 spray into both nostrils daily.  Marland Kitchen gabapentin (NEURONTIN) 100 MG capsule Take 1 capsule (100 mg total) by mouth at bedtime.  Marland Kitchen glucose blood (ONETOUCH VERIO) test strip Use as instructed to check blood sugars one time per day dx: e11.65  . metFORMIN (GLUCOPHAGE) 1000 MG tablet TAKE 1 TABLET TWICE DAILY WITH MORNING AND EVENING MEALS  . pantoprazole (PROTONIX) 40 MG tablet Take 1 tablet (40 mg total) by mouth daily.  . pravastatin (PRAVACHOL) 40 MG tablet Take 1 tablet (40 mg total) by mouth daily.  . tamsulosin (FLOMAX) 0.4 MG CAPS capsule Take 1 capsule by mouth daily  . valsartan-hydrochlorothiazide (DIOVAN-HCT) 160-25 MG tablet Take 1 tablet by mouth daily.   No facility-administered encounter medications on file as of 12/11/2018.      Objective:    Goals Addressed            This Visit's Progress     Patient Stated   . I would like to continue optimize my diabetes management (pt-stated)       Current Barriers:  . Diabetes: 123456; complicated by chronic medical conditions including CKD, HTN, most recent A1c 7.9% (was 7% on 07/02/18) . Current antihyperglycemic regimen: metformin 1G BID o Patient wanted to discuss other therapies for diabetes management o Discussed potential transition to GLP1 therapy (SQ vs. PO) o Patient voiced interest in GLP1 therapy and will take some time to research and decide before next PCP appt . Denies hypoglycemic symptoms; denies hyperglycemic symptoms . Current meal patterns: discusses plate method and reducing carbohydrate intake; avoiding sugary drinks . Current exercise: n/a-states winter temps make exercise challenging . Current blood glucose readings: FBG 110-130 in the AM before eating . Cardiovascular risk reduction: o Current hypertensive regimen: valsartan/HCTZ o Current hyperlipidemia regimen: pravastatin o Current antiplatelet regimen: n/a  Pharmacist Clinical Goal(s):  Marland Kitchen Over the next 90 days, patient will work with PharmD and primary care provider to address needs related to optimization of diabetes management  Interventions: . Comprehensive medication review performed, medication list updated in electronic medical record . Reviewed & discussed the following diabetes-related information with patient: o Follow ADA recommended "diabetes-friendly" diet  (reviewed healthy snack/food options) o Reviewed medication purpose/side effects-->patient denies adverse events  Patient Self Care Activities:  . Patient will check blood glucose 3x weekly , document, and provide at future appointments . Patient will focus  on medication adherence by continuing to take medication as prescribed . Patient will take medications as prescribed . Patient will contact provider with any episodes of hypoglycemia  . Patient will report any questions or concerns to provider   Initial goal documentation         Plan:   The care management team will reach out to the patient again over the next 6-8 weeks   Provider Signature Regina Eck, PharmD, [place] Pharmacist, [facility] [place]: [phone]

## 2018-12-25 ENCOUNTER — Telehealth: Payer: Self-pay

## 2018-12-25 ENCOUNTER — Other Ambulatory Visit: Payer: Self-pay

## 2018-12-25 ENCOUNTER — Ambulatory Visit: Payer: Self-pay

## 2018-12-25 DIAGNOSIS — E1122 Type 2 diabetes mellitus with diabetic chronic kidney disease: Secondary | ICD-10-CM

## 2018-12-25 DIAGNOSIS — N182 Chronic kidney disease, stage 2 (mild): Secondary | ICD-10-CM

## 2018-12-25 DIAGNOSIS — I129 Hypertensive chronic kidney disease with stage 1 through stage 4 chronic kidney disease, or unspecified chronic kidney disease: Secondary | ICD-10-CM

## 2018-12-28 NOTE — Chronic Care Management (AMB) (Signed)
Chronic Care Management   Follow Up Note   12/25/2018 Name: Kelly Lambert MRN: [id number] DOB: 18-Oct-1950  Referred by: [name], MD Reason for referral : Chronic Care Management (CCM RNCM Telephone Follow up )   Kelly Lambert is a 68 y.o. year old male who is a primary care patient of [name], MD. The CCM team was consulted for assistance with chronic disease management and care coordination needs.    Review of patient status, including review of consultants reports, relevant laboratory and other test results, and collaboration with appropriate care team members and the patient's provider was performed as part of comprehensive patient evaluation and provision of chronic care management services.    SDOH (Social Determinants of Health) screening performed today: None. See Care Plan for related entries.   Placed an outbound call to Kelly Lambert for a CCM RN CM follow up and his care plan was updated.   Outpatient Encounter Medications as of 12/25/2018  Medication Sig  . ACCU-CHEK FASTCLIX LANCETS MISC Use as directed to check blood sugars 1 time per day dx: e11.65  . aspirin EC 81 MG tablet Take 81 mg by mouth daily.  . cyclobenzaprine (FLEXERIL) 5 MG tablet TAKE 1 TABLET BY MOUTH EVERY NIGHT AT BEDTIME AS NEEDED  . fluticasone (FLONASE) 50 MCG/ACT nasal spray Place 1 spray into both nostrils daily.  Marland Kitchen gabapentin (NEURONTIN) 100 MG capsule Take 1 capsule (100 mg total) by mouth at bedtime.  Marland Kitchen glucose blood (ONETOUCH VERIO) test strip Use as instructed to check blood sugars one time per day dx: e11.65  . metFORMIN (GLUCOPHAGE) 1000 MG tablet TAKE 1 TABLET TWICE DAILY WITH MORNING AND EVENING MEALS  . pantoprazole (PROTONIX) 40 MG tablet Take 1 tablet (40 mg total) by mouth daily.  . pravastatin (PRAVACHOL) 40 MG tablet Take 1 tablet (40 mg total) by mouth daily.  . tamsulosin (FLOMAX) 0.4 MG CAPS capsule Take 1 capsule by mouth daily  .  valsartan-hydrochlorothiazide (DIOVAN-HCT) 160-25 MG tablet Take 1 tablet by mouth daily.   No facility-administered encounter medications on file as of 12/25/2018.     Goals Addressed      Patient Stated   . "I would like to review nutrtional information to help with meal planning" (pt-stated)       Current Barriers:  Marland Kitchen Knowledge Deficits related to disease process and Self Health management for Diabetes   Nurse Case Manager Clinical Goal(s):  Marland Kitchen Over the next 30 days, patient will work with Consulting civil engineer to address Diabetes disease process, Meal Planning and Knowing What to do for Hypo/Hyperglycemic events. Goal Met . New - 11/25/18 - Over the next 30 days, patient will collaborate the the embedded Pharm D Lottie Dawson for comprehensive medication review and to evaluate current diabetic pharmacy regimen Goal Met . New - 11/25/18 - Over the next 90 days, patient will have improved daily glycemic control (80-130) and will have lowered his A1C <7.9  CCM RN CM Interventions:  Completed 12/25/18 with patient   . Evaluation of current treatment plan related to Diabetes and patient's adherence to plan as established by provider. . Provided education to patient re: recent A1C has increased to 7.9 obtained on 11/11/18; discussed this is a jump from 6.9 from 1 year ago; discussed target A1C; Reinforced following ADA recommendations diet, following a low carb diabetic friendly diet; patient admits to eating hot dogs and drinking soda; discussed mailing patient resources for review of Meal planning using the plate method with  portion control and low Carb food choices; discussed recommendations for implementing daily exercise (150 mins per week)  . Reviewed medications with patient and discussed patient is adhering to taking his Metformin bid as directed w/o missed doses; he has spoken with embedded Pharm D Lottie Dawson and may consider starting a GLP 1 in the future . Discussed plans with patient for  ongoing care management follow up and provided patient with direct contact information for care management team . Advised patient, providing education and rationale, to check cbg daily before meals and record, calling the PCP and or CCM team for findings outside established parameters.  <80 and or >200  Patient Self Care Activities:  . Self administers medications as prescribed . Attends all scheduled provider appointments . Calls pharmacy for medication refills . Performs ADL's independently . Performs IADL's independently . Calls provider office for new concerns or questions  Please see past updates related to this goal by clicking on the "Past Updates" button in the selected goal       . "to keep my BP well controlled" (pt-stated)       Current Barriers:  Marland Kitchen Knowledge Deficits related to Hypertension  . Chronic Disease Management support and education needs related to Hypertension and DMII  Nurse Case Manager Clinical Goal(s):  Marland Kitchen Over the next 90 days, patient will verbalize basic understanding of Hypertension disease process and self health management plan as evidenced by patient will be able to verbalize the parameters for his target BP and will be able to Self check his BP at home and routinely  CCM RN CM Interventions:  12/25/18 completed call with patient   . Evaluation of current treatment plan related to Hypertension and patient's adherence to plan as established by provider. . Provided education to patient re: target BP per AHA and ADA recommendations; 130/80 or lower and unless otherwise directed by MD . Reviewed medications with patient and discussed patient is adhering to taking his antihypertensive medication exactly as prescribed and w/o missed doses . Discussed plans with patient for ongoing care management follow up and provided patient with direct contact information for care management team . Advised patient, providing education and rationale, to monitor blood  pressure daily and record, calling the CCM team and or PCP for findings outside established parameters.  . Provided patient with printed educational materials related to What Is High Blood Pressure; Why Should I restrict Sodium; African-Americans and High Blood Pressure  Patient Self Care Activities:  . Self administers medications as prescribed . Attends all scheduled provider appointments . Calls pharmacy for medication refills . Calls provider office for new concerns or questions  Initial goal documentation         Telephone follow up appointment with care management team member scheduled for: 02/22/19  Barb Merino, RN, BSN, CCM Care Management Coordinator [place] [facility]  Direct Phone: [phone]

## 2018-12-28 NOTE — Patient Instructions (Addendum)
Visit Information  Goals Addressed      Patient Stated   . "I would like to review nutrtional information to help with meal planning" (pt-stated)       Current Barriers:  Marland Kitchen Knowledge Deficits related to disease process and Self Health management for Diabetes   Nurse Case Manager Clinical Goal(s):  Marland Kitchen Over the next 30 days, patient will work with Consulting civil engineer to address Diabetes disease process, Meal Planning and Knowing What to do for Hypo/Hyperglycemic events. Goal Met . New - 11/25/18 - Over the next 30 days, patient will collaborate the the embedded Pharm D Lottie Dawson for comprehensive medication review and to evaluate current diabetic pharmacy regimen Goal Met . New - 11/25/18 - Over the next 90 days, patient will have improved daily glycemic control (80-130) and will have lowered his A1C <7.9  CCM RN CM Interventions:  Completed 12/25/18 with patient   . Evaluation of current treatment plan related to Diabetes and patient's adherence to plan as established by provider. . Provided education to patient re: recent A1C has increased to 7.9 obtained on 11/11/18; discussed this is a jump from 6.9 from 1 year ago; discussed target A1C; Reinforced following ADA recommendations diet, following a low carb diabetic friendly diet; patient admits to eating hot dogs and drinking soda; discussed mailing patient resources for review of Meal planning using the plate method with portion control and low Carb food choices; discussed recommendations for implementing daily exercise (150 mins per week)  . Reviewed medications with patient and discussed patient is adhering to taking his Metformin bid as directed w/o missed doses; he has spoken with embedded Pharm D Lottie Dawson and may consider starting a GLP 1 in the future . Discussed plans with patient for ongoing care management follow up and provided patient with direct contact information for care management team . Advised patient, providing education  and rationale, to check cbg daily before meals and record, calling the PCP and or CCM team for findings outside established parameters.  <80 and or >200  Patient Self Care Activities:  . Self administers medications as prescribed . Attends all scheduled provider appointments . Calls pharmacy for medication refills . Performs ADL's independently . Performs IADL's independently . Calls provider office for new concerns or questions  Please see past updates related to this goal by clicking on the "Past Updates" button in the selected goal       . "to keep my BP well controlled" (pt-stated)       Current Barriers:  Marland Kitchen Knowledge Deficits related to Hypertension  . Chronic Disease Management support and education needs related to Hypertension and DMII  Nurse Case Manager Clinical Goal(s):  Marland Kitchen Over the next 90 days, patient will verbalize basic understanding of Hypertension disease process and self health management plan as evidenced by patient will be able to verbalize the parameters for his target BP and will be able to Self check his BP at home and routinely  CCM RN CM Interventions:  12/25/18 completed call with patient   . Evaluation of current treatment plan related to Hypertension and patient's adherence to plan as established by provider. . Provided education to patient re: target BP per AHA and ADA recommendations; 130/80 or lower and unless otherwise directed by MD . Reviewed medications with patient and discussed patient is adhering to taking his antihypertensive medication exactly as prescribed and w/o missed doses . Discussed plans with patient for ongoing care management follow up and provided patient  with direct contact information for care management team . Advised patient, providing education and rationale, to monitor blood pressure daily and record, calling the CCM team and or PCP for findings outside established parameters.  . Provided patient with printed educational materials  related to What Is High Blood Pressure; Why Should I restrict Sodium; African-Americans and High Blood Pressure; Life's Simple 7  Patient Self Care Activities:  . Self administers medications as prescribed . Attends all scheduled provider appointments . Calls pharmacy for medication refills . Calls provider office for new concerns or questions  Initial goal documentation        The patient verbalized understanding of instructions provided today and declined a print copy of patient instruction materials.   Telephone follow up appointment with care management team member scheduled for: 02/22/19  Barb Merino, RN, BSN, CCM Care Management Coordinator [facility]  Direct Phone: [phone]

## 2019-01-05 ENCOUNTER — Other Ambulatory Visit: Payer: Self-pay

## 2019-01-05 ENCOUNTER — Encounter: Payer: Self-pay | Admitting: Internal Medicine

## 2019-01-05 MED ORDER — ONETOUCH VERIO VI STRP: ORAL_STRIP | 2 refills | Status: DC

## 2019-01-15 ENCOUNTER — Other Ambulatory Visit: Payer: Self-pay | Admitting: Internal Medicine

## 2019-01-18 MED ORDER — METFORMIN HCL 1000 MG PO TABS: ORAL_TABLET | ORAL | 2 refills | Status: DC

## 2019-01-18 MED ORDER — GABAPENTIN 100 MG PO CAPS: 100.0000 mg | ORAL_CAPSULE | Freq: Every day | ORAL | 2 refills | Status: DC

## 2019-01-18 MED ORDER — PRAVASTATIN SODIUM 40 MG PO TABS: 40.0000 mg | ORAL_TABLET | Freq: Every day | ORAL | 2 refills | Status: DC

## 2019-01-18 NOTE — Telephone Encounter (Signed)
Refill for [employer]

## 2019-01-19 ENCOUNTER — Other Ambulatory Visit: Payer: Self-pay

## 2019-01-21 ENCOUNTER — Other Ambulatory Visit: Payer: Self-pay

## 2019-02-01 ENCOUNTER — Ambulatory Visit (INDEPENDENT_AMBULATORY_CARE_PROVIDER_SITE_OTHER): Payer: Medicare HMO | Admitting: Internal Medicine

## 2019-02-01 ENCOUNTER — Telehealth: Payer: Self-pay | Admitting: Pharmacist

## 2019-02-01 ENCOUNTER — Other Ambulatory Visit: Payer: Self-pay

## 2019-02-01 ENCOUNTER — Encounter: Payer: Self-pay | Admitting: Internal Medicine

## 2019-02-01 VITALS — BP 110/74 | HR 77 | Temp 98.0°F | Ht 70.0 in | Wt 209.0 lb

## 2019-02-01 DIAGNOSIS — N182 Chronic kidney disease, stage 2 (mild): Secondary | ICD-10-CM | POA: Diagnosis not present

## 2019-02-01 DIAGNOSIS — Z6829 Body mass index (BMI) 29.0-29.9, adult: Secondary | ICD-10-CM

## 2019-02-01 DIAGNOSIS — E1122 Type 2 diabetes mellitus with diabetic chronic kidney disease: Secondary | ICD-10-CM

## 2019-02-01 DIAGNOSIS — I951 Orthostatic hypotension: Secondary | ICD-10-CM

## 2019-02-01 DIAGNOSIS — E663 Overweight: Secondary | ICD-10-CM

## 2019-02-01 DIAGNOSIS — I129 Hypertensive chronic kidney disease with stage 1 through stage 4 chronic kidney disease, or unspecified chronic kidney disease: Secondary | ICD-10-CM | POA: Diagnosis not present

## 2019-02-01 LAB — POCT GLUCOSE (DEVICE FOR HOME USE): POC Glucose: 113 mg/dl — AB (ref 70–99)

## 2019-02-01 MED ORDER — ONETOUCH DELICA LANCETS 33G MISC: 2 refills | Status: DC

## 2019-02-01 MED ORDER — FARXIGA 5 MG PO TABS: 5.0000 mg | ORAL_TABLET | Freq: Every day | ORAL | 1 refills | Status: DC

## 2019-02-01 MED ORDER — PRAVASTATIN SODIUM 40 MG PO TABS: 40.0000 mg | ORAL_TABLET | Freq: Every day | ORAL | 2 refills | Status: DC

## 2019-02-01 MED ORDER — METFORMIN HCL 1000 MG PO TABS: ORAL_TABLET | ORAL | 2 refills | Status: DC

## 2019-02-01 MED ORDER — ONETOUCH VERIO VI STRP: ORAL_STRIP | 2 refills | Status: DC

## 2019-02-01 NOTE — Patient Instructions (Signed)
Dapagliflozin tablets What is this medicine? DAPAGLIFLOZIN (DAP a gli FLOE zin) controls blood sugar in people with diabetes. It is used with lifestyle changes like diet and exercise. It also treats heart failure. It may lower the need for treatment of heart failure in the hospital. This medicine may be used for other purposes; ask your health care provider or pharmacist if you have questions. COMMON BRAND NAME(S): Farxiga What should I tell my health care provider before I take this medicine? They need to know if you have any of these conditions:  dehydration  diabetic ketoacidosis  diet low in salt  eating less due to illness, surgery, dieting, or any other reason  having surgery  history of pancreatitis or pancreas problems  history of yeast infection of the penis or vagina  if you often drink alcohol  infections in the bladder, kidneys, or urinary tract  kidney disease  low blood pressure  on hemodialysis  problems urinating  type 1 diabetes  uncircumcised male  an unusual or allergic reaction to dapagliflozin, other medicines, foods, dyes, or preservatives  pregnant or trying to get pregnant  breast-feeding How should I use this medicine? Take this medicine by mouth with a glass of water. Follow the directions on the prescription label. You can take it with or without food. If it upsets your stomach, take it with food. Take this medicine in the morning. Take your dose at the same time each day. Do not take more often than directed. Do not stop taking except on your doctor's advice. A special MedGuide will be given to you by the pharmacist with each prescription and refill. Be sure to read this information carefully each time. Talk to your pediatrician regarding the use of this medicine in children. Special care may be needed. Overdosage: If you think you have taken too much of this medicine contact a poison control center or emergency room at once. NOTE: This  medicine is only for you. Do not share this medicine with others. What if I miss a dose? If you miss a dose, take it as soon as you can. If it is almost time for your next dose, take only that dose. Do not take double or extra doses. What may interact with this medicine? Do not take this medicine with any of the following medications:  gatifloxacin This medicine may also interact with the following medications:  alcohol  certain medicines for blood pressure, heart disease  diuretics  insulin  nateglinide  pioglitazone  quinolone antibiotics like ciprofloxacin, levofloxacin, ofloxacin  repaglinide  some herbal dietary supplements  steroid medicines like prednisone or cortisone  sulfonylureas like glimepiride, glipizide, glyburide  thyroid medicine This list may not describe all possible interactions. Give your health care provider a list of all the medicines, herbs, non-prescription drugs, or dietary supplements you use. Also tell them if you smoke, drink alcohol, or use illegal drugs. Some items may interact with your medicine. What should I watch for while using this medicine? Visit your doctor or health care professional for regular checks on your progress. This medicine can cause a serious condition in which there is too much acid in the blood. If you develop nausea, vomiting, stomach pain, unusual tiredness, or breathing problems, stop taking this medicine and call your doctor right away. If possible, use a ketone dipstick to check for ketones in your urine. A test called the HbA1C (A1C) will be monitored. This is a simple blood test. It measures your blood sugar control over   the last 2 to 3 months. You will receive this test every 3 to 6 months. Learn how to check your blood sugar. Learn the symptoms of low and high blood sugar and how to manage them. Always carry a quick-source of sugar with you in case you have symptoms of low blood sugar. Examples include hard sugar  candy or glucose tablets. Make sure others know that you can choke if you eat or drink when you develop serious symptoms of low blood sugar, such as seizures or unconsciousness. They must get medical help at once. Tell your doctor or health care professional if you have high blood sugar. You might need to change the dose of your medicine. If you are sick or exercising more than usual, you might need to change the dose of your medicine. Do not skip meals. Ask your doctor or health care professional if you should avoid alcohol. Many nonprescription cough and cold products contain sugar or alcohol. These can affect blood sugar. Wear a medical ID bracelet or chain, and carry a card that describes your disease and details of your medicine and dosage times. What side effects may I notice from receiving this medicine? Side effects that you should report to your doctor or health care professional as soon as possible:  allergic reactions like skin rash, itching or hives, swelling of the face, lips, or tongue  breathing problems  dizziness  feeling faint or lightheaded, falls  muscle weakness  nausea, vomiting, unusual stomach upset or pain  new pain or tenderness, change in skin color, sores or ulcers, or infection in legs or feet  penile discharge, itching, or pain in men  signs and symptoms of a genital infection, such as fever; tenderness, redness, or swelling in the genitals or area from the genitals to the back of the rectum  signs and symptoms of low blood sugar such as feeling anxious, confusion, dizziness, increased hunger, unusually weak or tired, sweating, shakiness, cold, irritable, headache, blurred vision, fast heartbeat, loss of consciousness  signs and symptoms of a urinary tract infection, such as fever, chills, a burning feeling when urinating, blood in the urine, back pain  trouble passing urine or change in the amount of urine, including an urgent need to urinate more often, in  larger amounts, or at night  unusual tiredness  vaginal discharge, itching, or odor in women Side effects that usually do not require medical attention (report to your doctor or health care professional if they continue or are bothersome):  mild increase in urination  thirsty This list may not describe all possible side effects. Call your doctor for medical advice about side effects. You may report side effects to FDA at 1-800-FDA-1088. Where should I keep my medicine? Keep out of the reach of children. Store at room temperature between 15 and 30 degrees C (59 and 86 degrees F). Throw away any unused medicine after the expiration date. NOTE: This sheet is a summary. It may not cover all possible information. If you have questions about this medicine, talk to your doctor, pharmacist, or health care provider.  2020 Elsevier/Gold Standard (2018-05-14 18:58:14)  

## 2019-02-01 NOTE — Progress Notes (Signed)
This visit occurred during the SARS-CoV-2 public health emergency.  Safety protocols were in place, including screening questions prior to the visit, additional usage of staff PPE, and extensive cleaning of exam room while observing appropriate contact time as indicated for disinfecting solutions.  Subjective:     Patient ID: Kelly Lambert , male    DOB: 08-22-1950 , 69 y.o.   MRN: [id number]   Chief Complaint  Patient presents with  . Diabetes  . Hypertension    HPI  He is here today for further evaluation of his DM. He reports that his blood sugars have been consistently above 150 for the past several weeks. He is not sure what may have contributed to this. He denies change in diet. He denies recent illness, fever, chills, etc. He reports compliance with meds. He reports that he has "not been feeling quite right". He thinks elevated blood sugars are making him feel bad. BS have been 150-180.   Hypertension This is a chronic problem. The current episode started more than 1 year ago. The problem has been gradually improving since onset. The problem is controlled. Pertinent negatives include no blurred vision, chest pain, palpitations or shortness of breath. The current treatment provides moderate improvement.     Past Medical History:  Diagnosis Date  . BPH (benign prostatic hyperplasia)   . Diabetes mellitus without complication ([place])   . Hyperlipidemia associated with type 2 diabetes mellitus ([place])   . Hypertension      Family History  Problem Relation Age of Onset  . Cancer Mother   . Cancer Father   . Cancer Brother      Current Outpatient Medications:  .  aspirin EC 81 MG tablet, Take 81 mg by mouth daily., Disp: , Rfl:  .  fluticasone (FLONASE) 50 MCG/ACT nasal spray, Place 1 spray into both nostrils daily., Disp: 16 g, Rfl: 2 .  gabapentin (NEURONTIN) 100 MG capsule, Take 1 capsule (100 mg total) by mouth at bedtime., Disp: 90 capsule, Rfl: 2 .  glucose blood  (ONETOUCH VERIO) test strip, Use as instructed to check blood sugars one time per day dx: e11.65, Disp: 150 each, Rfl: 2 .  metFORMIN (GLUCOPHAGE) 1000 MG tablet, TAKE 1 TABLET TWICE DAILY WITH MORNING AND EVENING MEALS, Disp: 180 tablet, Rfl: 2 .  OneTouch Delica Lancets 40J MISC, Use as instructed to check blood sugars one time per day dx: e11.65, Disp: 150 each, Rfl: 2 .  pantoprazole (PROTONIX) 40 MG tablet, Take 1 tablet (40 mg total) by mouth daily., Disp: 90 tablet, Rfl: 1 .  pravastatin (PRAVACHOL) 40 MG tablet, Take 1 tablet (40 mg total) by mouth daily., Disp: 90 tablet, Rfl: 2 .  tamsulosin (FLOMAX) 0.4 MG CAPS capsule, Take 1 capsule by mouth daily, Disp: 90 capsule, Rfl: 1 .  valsartan-hydrochlorothiazide (DIOVAN-HCT) 160-25 MG tablet, Take 1 tablet by mouth daily., Disp: 90 tablet, Rfl: 1   No Known Allergies   Review of Systems  Constitutional: Negative.   Eyes: Negative for blurred vision.  Respiratory: Negative.  Negative for shortness of breath.   Cardiovascular: Negative.  Negative for chest pain and palpitations.  Gastrointestinal: Negative.   Neurological: Positive for light-headedness.  Psychiatric/Behavioral: Negative.      Today's Vitals   02/01/19 1149  BP: 110/74  Pulse: 77  Temp: 98 F (36.7 C)  TempSrc: Oral  Weight: 209 lb (94.8 kg)  Height: 5' 10"  (1.778 m)  PainSc: 0-No pain   Body mass index is 29.99 kg/m.  Objective:  Physical Exam Vitals and nursing note reviewed.  Constitutional:      Appearance: Normal appearance.  Cardiovascular:     Rate and Rhythm: Normal rate and regular rhythm.     Heart sounds: Normal heart sounds.  Pulmonary:     Effort: Pulmonary effort is normal.     Breath sounds: Normal breath sounds.  Skin:    General: Skin is warm.  Neurological:     General: No focal deficit present.     Mental Status: He is alert.  Psychiatric:        Mood and Affect: Mood normal.         Assessment And Plan:     1. Type 2  diabetes mellitus with stage 2 chronic kidney disease, without long-term current use of insulin (HCC)  Chronic. I will check a1c today and adjust meds as needed. I plan to start him on Farxiga, 11m once daily; however, I will make further recommendations once his labs are back for review. Pt advised that I will need to start him on insulin should his a1c be above 9%. He reluctantly agrees with his treatment plan. I plan to see him four weeks after he starts his new regimen.   - POCT Glucose (Device for Home Use) - Hemoglobin A1c - BMP8+EGFR  2. Hypertensive nephropathy  Chronic. He is encouraged to avoid adding salt to his foods.   3. Orthostatic hypotension  He was advised to cut back to 1/2 tab valsartan/hct once daily.  He will rto in one week for a nurse visit. He is encouraged to stay well hydrated.  4. Overweight with body mass index (BMI) of 29 to 29.9 in adult  Wt Readings from Last 3 Encounters:  02/01/19 209 lb (94.8 kg)  11/11/18 219 lb 6.4 oz (99.5 kg)  07/02/18 209 lb 3.2 oz (94.9 kg)   Recent weight loss likely related to elevated blood sugars. Pt advised that he will likely gain some of this weight back as his sugars normalize. He is encouraged to strive for weight under 200 pounds to decrease cardiac risk.   RMaximino Greenland MD    THE PATIENT IS ENCOURAGED TO PRACTICE SOCIAL DISTANCING DUE TO THE COVID-19 PANDEMIC.

## 2019-02-02 ENCOUNTER — Ambulatory Visit: Payer: Self-pay

## 2019-02-02 LAB — BMP8+EGFR
BUN/Creatinine Ratio: 13 (ref 10–24)
BUN: 15 mg/dL (ref 8–27)
CO2: 28 mmol/L (ref 20–29)
Calcium: 10.1 mg/dL (ref 8.6–10.2)
Chloride: 100 mmol/L (ref 96–106)
Creatinine, Ser: 1.13 mg/dL (ref 0.76–1.27)
GFR calc Af Amer: 77 mL/min/{1.73_m2} (ref >59)
GFR calc non Af Amer: 67 mL/min/{1.73_m2} (ref >59)
Glucose: 91 mg/dL (ref 65–99)
Potassium: 4.4 mmol/L (ref 3.5–5.2)
Sodium: 140 mmol/L (ref 134–144)

## 2019-02-02 LAB — HEMOGLOBIN A1C
Est. average glucose Bld gHb Est-mCnc: 174 mg/dL
Hgb A1c MFr Bld: 7.7 % — ABNORMAL HIGH (ref 4.8–5.6)

## 2019-02-04 ENCOUNTER — Telehealth: Payer: Self-pay

## 2019-02-04 MED ORDER — VALSARTAN-HYDROCHLOROTHIAZIDE 160-25 MG PO TABS: 1.0000 | ORAL_TABLET | Freq: Every day | ORAL | 1 refills | Status: DC

## 2019-02-04 NOTE — Telephone Encounter (Signed)
The pt left a message that he went back to taking a whole tablet of his blood pressure pill because when he took a half tablet he woke up the next day with a headache, his blood pressure was 140/98 when he checked it. The pt said his blood pressure is now normal since he started back taking a whole pill.  The pt was told that Dr. Baird Cancer said to increase his water intake and to keep a log of his blood sugars.

## 2019-02-09 ENCOUNTER — Other Ambulatory Visit: Payer: Self-pay

## 2019-02-09 ENCOUNTER — Ambulatory Visit: Payer: Medicare HMO

## 2019-02-09 VITALS — BP 148/82 | HR 78 | Temp 97.9°F | Ht 70.0 in | Wt 212.6 lb

## 2019-02-09 DIAGNOSIS — Z0130 Encounter for examination of blood pressure without abnormal findings: Secondary | ICD-10-CM

## 2019-02-09 MED ORDER — AMLODIPINE BESYLATE 2.5 MG PO TABS: 2.5000 mg | ORAL_TABLET | Freq: Every day | ORAL | 11 refills | Status: DC

## 2019-02-09 NOTE — Progress Notes (Signed)
Pt presents today for a bp check 148/82 manual, 156/87 with his cuff. Provider aware Per provider:  and this is only medication he is taking correct? - have his bp been running high at home? did he rush here? has he increased his water intake?  pls start amlodipine 2.5mg  nightly - he should take at dinner or later  two weeks - I need to see him for bp check  ok. if he is not on any other bp meds, start new medication.  perscription sent to pharmacy

## 2019-02-10 ENCOUNTER — Other Ambulatory Visit: Payer: Self-pay

## 2019-02-10 MED ORDER — ACCU-CHEK GUIDE VI STRP: ORAL_STRIP | 12 refills | Status: DC

## 2019-02-10 MED ORDER — ACCU-CHEK MULTICLIX LANCETS MISC: 2 refills | Status: AC

## 2019-02-10 MED ORDER — ACCU-CHEK GUIDE VI STRP: ORAL_STRIP | 2 refills | Status: DC

## 2019-02-10 MED ORDER — ACCU-CHEK MULTICLIX LANCETS MISC: 12 refills | Status: DC

## 2019-02-10 MED ORDER — ACCU-CHEK GUIDE W/DEVICE KIT: 1.0000 | PACK | Freq: Three times a day (TID) | 3 refills | Status: DC

## 2019-02-18 DIAGNOSIS — N5201 Erectile dysfunction due to arterial insufficiency: Secondary | ICD-10-CM | POA: Diagnosis not present

## 2019-02-18 DIAGNOSIS — R972 Elevated prostate specific antigen [PSA]: Secondary | ICD-10-CM | POA: Diagnosis not present

## 2019-02-18 DIAGNOSIS — R3912 Poor urinary stream: Secondary | ICD-10-CM | POA: Diagnosis not present

## 2019-02-18 DIAGNOSIS — N401 Enlarged prostate with lower urinary tract symptoms: Secondary | ICD-10-CM | POA: Diagnosis not present

## 2019-02-22 ENCOUNTER — Telehealth: Payer: Self-pay

## 2019-03-01 ENCOUNTER — Encounter: Payer: Self-pay | Admitting: Internal Medicine

## 2019-03-01 ENCOUNTER — Other Ambulatory Visit: Payer: Self-pay

## 2019-03-01 ENCOUNTER — Ambulatory Visit (INDEPENDENT_AMBULATORY_CARE_PROVIDER_SITE_OTHER): Payer: Medicare HMO | Admitting: Internal Medicine

## 2019-03-01 ENCOUNTER — Ambulatory Visit: Payer: Self-pay | Admitting: Pharmacist

## 2019-03-01 VITALS — BP 112/74 | HR 80 | Temp 98.6°F | Ht 70.0 in | Wt 213.2 lb

## 2019-03-01 DIAGNOSIS — E1122 Type 2 diabetes mellitus with diabetic chronic kidney disease: Secondary | ICD-10-CM

## 2019-03-01 DIAGNOSIS — Z860100 Personal history of colon polyps, unspecified: Secondary | ICD-10-CM | POA: Insufficient documentation

## 2019-03-01 DIAGNOSIS — I129 Hypertensive chronic kidney disease with stage 1 through stage 4 chronic kidney disease, or unspecified chronic kidney disease: Secondary | ICD-10-CM

## 2019-03-01 DIAGNOSIS — N182 Chronic kidney disease, stage 2 (mild): Secondary | ICD-10-CM | POA: Diagnosis not present

## 2019-03-01 MED ORDER — VALSARTAN 160 MG PO TABS: 160.0000 mg | ORAL_TABLET | Freq: Every day | ORAL | 2 refills | Status: DC

## 2019-03-01 NOTE — Progress Notes (Signed)
This visit occurred during the SARS-CoV-2 public health emergency.  Safety protocols were in place, including screening questions prior to the visit, additional usage of staff PPE, and extensive cleaning of exam room while observing appropriate contact time as indicated for disinfecting solutions.  Subjective:     Patient ID: Kelly Lambert , male    DOB: [birth year] , 69 y.o.   MRN: [id number]   Chief Complaint  Patient presents with  . Diabetes  . Hypertension    HPI  He is here today for f/u. He has yet to start [place]. No new issues since last visit.  Amlodipine 2.99m daily was started at last nurse visit. He has tolerated this medication without any adverse effects.   Diabetes He presents for his follow-up diabetic visit. He has type 2 diabetes mellitus. There are no hypoglycemic associated symptoms. Pertinent negatives for diabetes include no blurred vision and no chest pain. There are no hypoglycemic complications. Diabetic complications include nephropathy. Risk factors for coronary artery disease include diabetes mellitus, dyslipidemia, hypertension and male sex. He is compliant with treatment most of the time. He is following a diabetic diet. Meal planning includes avoidance of concentrated sweets. He participates in exercise intermittently. His home blood glucose trend is fluctuating minimally. His breakfast blood glucose is taken between 9-10 am. His breakfast blood glucose range is generally 110-130 mg/dl. An ACE inhibitor/angiotensin II receptor blocker is being taken. Eye exam is current.  Hypertension This is a chronic problem. The current episode started more than 1 year ago. The problem has been gradually improving since onset. The problem is controlled. Pertinent negatives include no blurred vision, chest pain, palpitations or shortness of breath. Risk factors for coronary artery disease include diabetes mellitus, dyslipidemia and male gender. Past treatments include  angiotensin blockers and diuretics. The current treatment provides moderate improvement. Compliance problems include exercise.  Hypertensive end-organ damage includes kidney disease.     Past Medical History:  Diagnosis Date  . BPH (benign prostatic hyperplasia)   . Diabetes mellitus without complication ([facility]   . Hyper[place] associated with type 2 diabetes mellitus ([place]   . Hypertension      Family History  Problem Relation Age of Onset  . Cancer Mother   . Cancer Father   . Cancer Brother      Current Outpatient Medications:  .  amLODipine (NORVASC) 2.5 MG tablet, Take 1 tablet (2.5 mg total) by mouth daily. Take 1 tablet at night for high blood pressure, Disp: 30 tablet, Rfl: 11 .  aspirin EC 81 MG tablet, Take 81 mg by mouth daily., Disp: , Rfl:  .  Blood Glucose Monitoring Suppl (ACCU-CHEK GUIDE) w/Device KIT, 1 kit by Does not apply route 3 (three) times daily., Disp: 1 kit, Rfl: 3 .  dapagliflozin propanediol (FARXIGA) 5 MG TABS tablet, Take 5 mg by mouth daily before breakfast., Disp: 30 tablet, Rfl: 1 .  fluticasone (FLONASE) 50 MCG/ACT nasal spray, Place 1 spray into both nostrils daily., Disp: 16 g, Rfl: 2 .  gabapentin (NEURONTIN) 100 MG capsule, Take 1 capsule (100 mg total) by mouth at bedtime., Disp: 90 capsule, Rfl: 2 .  glucose blood (ACCU-CHEK GUIDE) test strip, Use to check blood sugar 3 times a day. Dx code E11.65, Disp: 300 each, Rfl: 2 .  Lancets (ACCU-CHEK MULTICLIX) lancets, Use to check blood sugars 3 times a day. Dx code: e11.65, Disp: 300 each, Rfl: 2 .  metFORMIN (GLUCOPHAGE) 1000 MG tablet, TAKE 1 TABLET TWICE DAILY WITH MORNING AND  EVENING MEALS, Disp: 180 tablet, Rfl: 2 .  pantoprazole (PROTONIX) 40 MG tablet, Take 1 tablet (40 mg total) by mouth daily., Disp: 90 tablet, Rfl: 1 .  pravastatin (PRAVACHOL) 40 MG tablet, Take 1 tablet (40 mg total) by mouth daily., Disp: 90 tablet, Rfl: 2 .  tamsulosin (FLOMAX) 0.4 MG CAPS capsule, Take 1 capsule by  mouth daily (Patient taking differently: Take 2 capsules by mouth daily), Disp: 90 capsule, Rfl: 1 .  valsartan (DIOVAN) 160 MG tablet, Take 1 tablet (160 mg total) by mouth daily., Disp: 90 tablet, Rfl: 2   No Known Allergies   Review of Systems  Constitutional: Negative.   Eyes: Negative for blurred vision.  Respiratory: Negative.  Negative for shortness of breath.   Cardiovascular: Negative.  Negative for chest pain and palpitations.  Gastrointestinal: Negative.   Neurological: Negative.   Psychiatric/Behavioral: Negative.      Today's Vitals   03/01/19 1035  BP: 112/74  Pulse: 80  Temp: 98.6 F (37 C)  TempSrc: Oral  Weight: 213 lb 3.2 oz (96.7 kg)  Height: 5' 10"  (1.778 m)  PainSc: 0-No pain   Body mass index is 30.59 kg/m.   Objective:  Physical Exam Vitals and nursing note reviewed.  Constitutional:      Appearance: Normal appearance.  Cardiovascular:     Rate and Rhythm: Normal rate and regular rhythm.     Heart sounds: Normal heart sounds.  Pulmonary:     Effort: Pulmonary effort is normal.     Breath sounds: Normal breath sounds.  Skin:    General: Skin is warm.  Neurological:     General: No focal deficit present.     Mental Status: He is alert.  Psychiatric:        Mood and Affect: Mood normal.         Assessment And Plan:     1. Type 2 diabetes mellitus with stage 2 chronic kidney disease, without long-term current use of insulin (HCC)  Chronic. His last a1c was reviewed, 7.7. I will check this again in April 2021. He is encouraged to exercise 30 minutes five days weekly. I will add Wilder Glade to his regimen (add to metformin) once he is out of current supply of BP meds. He will start with 55m dose prior to titration to 16mdose. He verbalizes understanding of his treatment plan. All questions were answered to his satisfaction.   2. Hypertensive nephropathy  Chronic, well controlled. He will continue with amlodipine. We had long discussion  regarding need for change in meds. Due to rising BS, SGLT2 will be added to his regimen. Pt is advised that this may cause urinary frequency. Therefore, I want to wait to start this until he is out of current supply of valsartan/hctz. I will change meds to valsartan 16060m rx was sent to mail order pharmacy. He will contact me when he starts the new valsartan 160m11mt that time, he will also start Farx[place] will rto four weeks after starting new medication.   3. Personal history of colonic polyps  He reports last colonoscopy was in 2016 and he was advised repeat study needed in five years. This was performed in [place]. [place] is in agreement with GI referral.   - Ambulatory referral to Gastroenterology   RobyMaximino Greenland    THE PATIENT IS ENCOURAGED TO PRACTICE SOCIAL DISTANCING DUE TO THE COVID-19 PANDEMIC.

## 2019-03-01 NOTE — Patient Instructions (Signed)
Start Valsartan 160mg  and Farxiga 5mg  daily when you RUN OUT of the Valsartan/hctz  Call Dr. Baird Cancer when you start the Farxiga, so we can schedule you for appt four weeks after that.

## 2019-03-03 ENCOUNTER — Other Ambulatory Visit: Payer: Self-pay

## 2019-03-03 DIAGNOSIS — Z86010 Personal history of colonic polyps: Secondary | ICD-10-CM | POA: Diagnosis not present

## 2019-03-03 DIAGNOSIS — K219 Gastro-esophageal reflux disease without esophagitis: Secondary | ICD-10-CM | POA: Diagnosis not present

## 2019-03-03 DIAGNOSIS — E119 Type 2 diabetes mellitus without complications: Secondary | ICD-10-CM | POA: Diagnosis not present

## 2019-03-03 MED ORDER — PANTOPRAZOLE SODIUM 40 MG PO TBEC: 40.0000 mg | DELAYED_RELEASE_TABLET | Freq: Every day | ORAL | 1 refills | Status: DC

## 2019-03-11 ENCOUNTER — Ambulatory Visit: Payer: Medicare HMO | Admitting: Internal Medicine

## 2019-03-12 NOTE — Progress Notes (Signed)
  Chronic Care Management   Outreach Note  03/01/2019 Name: Kelly Lambert MRN: [id number] DOB: Apr 20, 1950  Referred by: [name], MD Reason for referral : Chronic Care Management and Diabetes   An unsuccessful telephone outreach was attempted today. The patient was referred to the case management team for assistance with care management and care coordination.   Follow Up Plan: A HIPPA compliant phone message was left for the patient providing contact information and requesting a return call.  The care management team will reach out to the patient again over the next 10-14 days.   SIGNATURE Regina Eck, PharmD, BCPS Clinical Pharmacist, [facility] [place]: [phone]

## 2019-03-18 DIAGNOSIS — D122 Benign neoplasm of ascending colon: Secondary | ICD-10-CM | POA: Diagnosis not present

## 2019-03-18 DIAGNOSIS — Z86010 Personal history of colonic polyps: Secondary | ICD-10-CM | POA: Diagnosis not present

## 2019-03-18 DIAGNOSIS — K635 Polyp of colon: Secondary | ICD-10-CM | POA: Diagnosis not present

## 2019-03-18 LAB — HM COLONOSCOPY

## 2019-03-23 ENCOUNTER — Telehealth: Payer: Self-pay

## 2019-03-23 NOTE — Telephone Encounter (Signed)
The pt said that he started the Valsartan 160 mg last week and that he hadn't been taking the [place] 5mg  daily.  The pt said he still has the samples, does he go a head and take 10 mg of Farxiga daily.

## 2019-03-23 NOTE — Telephone Encounter (Signed)
The pt was notified to take the 5 mg of frigga daily and that Dr. Baird Cancer wants to f/u in 1 month.

## 2019-03-23 NOTE — Telephone Encounter (Signed)
Start with 5mg  - see me in one month.

## 2019-03-23 NOTE — Telephone Encounter (Signed)
-----   Message from [name], MD sent at 03/22/2019 11:31 PM EDT ----- Hey, pls call in rx valsartan 160mg  #90/1 refill. Has he been taking [place]? Tell him to continue with 5mg  until he starts new rx valsartan. I will then increase dose to 10mg  [place].  ----- Message ----- From: Michelle Nasuti, [place] Sent: 03/03/2019   5:03 PM EDT To: [name], MD  The pt called and left a message that he was told to call back to let dr sanders know how many valsartan hctz 160 / 25 mg that he has left and the pt said he has 22 pills left.

## 2019-04-13 ENCOUNTER — Other Ambulatory Visit: Payer: Self-pay

## 2019-04-13 MED ORDER — AMLODIPINE BESYLATE 2.5 MG PO TABS: 2.5000 mg | ORAL_TABLET | Freq: Every day | ORAL | 2 refills | Status: DC

## 2019-04-13 MED ORDER — VALSARTAN 160 MG PO TABS: 160.0000 mg | ORAL_TABLET | Freq: Every day | ORAL | 2 refills | Status: DC

## 2019-04-13 MED ORDER — GABAPENTIN 100 MG PO CAPS: 100.0000 mg | ORAL_CAPSULE | Freq: Every day | ORAL | 2 refills | Status: DC

## 2019-04-14 ENCOUNTER — Other Ambulatory Visit: Payer: Self-pay

## 2019-04-14 DIAGNOSIS — R972 Elevated prostate specific antigen [PSA]: Secondary | ICD-10-CM | POA: Diagnosis not present

## 2019-04-14 MED ORDER — FARXIGA 10 MG PO TABS: 10.0000 mg | ORAL_TABLET | Freq: Every day | ORAL | 1 refills | Status: DC

## 2019-04-15 ENCOUNTER — Telehealth: Payer: Self-pay

## 2019-04-26 ENCOUNTER — Telehealth: Payer: Self-pay

## 2019-05-05 ENCOUNTER — Other Ambulatory Visit: Payer: Self-pay

## 2019-05-05 ENCOUNTER — Encounter: Payer: Self-pay | Admitting: Internal Medicine

## 2019-05-05 ENCOUNTER — Ambulatory Visit (INDEPENDENT_AMBULATORY_CARE_PROVIDER_SITE_OTHER): Payer: Medicare HMO | Admitting: Internal Medicine

## 2019-05-05 VITALS — BP 114/72 | HR 95 | Temp 97.9°F | Ht 70.0 in | Wt 212.4 lb

## 2019-05-05 DIAGNOSIS — E6609 Other obesity due to excess calories: Secondary | ICD-10-CM | POA: Diagnosis not present

## 2019-05-05 DIAGNOSIS — G8929 Other chronic pain: Secondary | ICD-10-CM | POA: Diagnosis not present

## 2019-05-05 DIAGNOSIS — I129 Hypertensive chronic kidney disease with stage 1 through stage 4 chronic kidney disease, or unspecified chronic kidney disease: Secondary | ICD-10-CM | POA: Diagnosis not present

## 2019-05-05 DIAGNOSIS — Z23 Encounter for immunization: Secondary | ICD-10-CM

## 2019-05-05 DIAGNOSIS — Z6830 Body mass index (BMI) 30.0-30.9, adult: Secondary | ICD-10-CM | POA: Diagnosis not present

## 2019-05-05 DIAGNOSIS — M25512 Pain in left shoulder: Secondary | ICD-10-CM | POA: Diagnosis not present

## 2019-05-05 DIAGNOSIS — E1122 Type 2 diabetes mellitus with diabetic chronic kidney disease: Secondary | ICD-10-CM

## 2019-05-05 DIAGNOSIS — N182 Chronic kidney disease, stage 2 (mild): Secondary | ICD-10-CM

## 2019-05-05 DIAGNOSIS — Z2821 Immunization not carried out because of patient refusal: Secondary | ICD-10-CM

## 2019-05-05 DIAGNOSIS — M25511 Pain in right shoulder: Secondary | ICD-10-CM | POA: Diagnosis not present

## 2019-05-05 MED ORDER — VALSARTAN 160 MG PO TABS: 160.0000 mg | ORAL_TABLET | Freq: Every day | ORAL | 2 refills | Status: DC

## 2019-05-05 NOTE — Patient Instructions (Signed)
Diabetes Mellitus and Exercise Exercising regularly is important for your overall health, especially when you have diabetes (diabetes mellitus). Exercising is not only about losing weight. It has many other health benefits, such as increasing muscle strength and bone density and reducing body fat and stress. This leads to improved fitness, flexibility, and endurance, all of which result in better overall health. Exercise has additional benefits for people with diabetes, including:  Reducing appetite.  Helping to lower and control blood glucose.  Lowering blood pressure.  Helping to control amounts of fatty substances (lipids) in the blood, such as cholesterol and triglycerides.  Helping the body to respond better to insulin (improving insulin sensitivity).  Reducing how much insulin the body needs.  Decreasing the risk for heart disease by: ? Lowering cholesterol and triglyceride levels. ? Increasing the levels of good cholesterol. ? Lowering blood glucose levels. What is my activity plan? Your health care provider or certified diabetes educator can help you make a plan for the type and frequency of exercise (activity plan) that works for you. Make sure that you:  Do at least 150 minutes of moderate-intensity or vigorous-intensity exercise each week. This could be brisk walking, biking, or water aerobics. ? Do stretching and strength exercises, such as yoga or weightlifting, at least 2 times a week. ? Spread out your activity over at least 3 days of the week.  Get some form of physical activity every day. ? Do not go more than 2 days in a row without some kind of physical activity. ? Avoid being inactive for more than 30 minutes at a time. Take frequent breaks to walk or stretch.  Choose a type of exercise or activity that you enjoy, and set realistic goals.  Start slowly, and gradually increase the intensity of your exercise over time. What do I need to know about managing my  diabetes?   Check your blood glucose before and after exercising. ? If your blood glucose is 240 mg/dL (13.3 mmol/L) or higher before you exercise, check your urine for ketones. If you have ketones in your urine, do not exercise until your blood glucose returns to normal. ? If your blood glucose is 100 mg/dL (5.6 mmol/L) or lower, eat a snack containing 15-20 grams of carbohydrate. Check your blood glucose 15 minutes after the snack to make sure that your level is above 100 mg/dL (5.6 mmol/L) before you start your exercise.  Know the symptoms of low blood glucose (hypoglycemia) and how to treat it. Your risk for hypoglycemia increases during and after exercise. Common symptoms of hypoglycemia can include: ? Hunger. ? Anxiety. ? Sweating and feeling clammy. ? Confusion. ? Dizziness or feeling light-headed. ? Increased heart rate or palpitations. ? Blurry vision. ? Tingling or numbness around the mouth, lips, or tongue. ? Tremors or shakes. ? Irritability.  Keep a rapid-acting carbohydrate snack available before, during, and after exercise to help prevent or treat hypoglycemia.  Avoid injecting insulin into areas of the body that are going to be exercised. For example, avoid injecting insulin into: ? The arms, when playing tennis. ? The legs, when jogging.  Keep records of your exercise habits. Doing this can help you and your health care provider adjust your diabetes management plan as needed. Write down: ? Food that you eat before and after you exercise. ? Blood glucose levels before and after you exercise. ? The type and amount of exercise you have done. ? When your insulin is expected to peak, if you use   insulin. Avoid exercising at times when your insulin is peaking.  When you start a new exercise or activity, work with your health care provider to make sure the activity is safe for you, and to adjust your insulin, medicines, or food intake as needed.  Drink plenty of water while  you exercise to prevent dehydration or heat stroke. Drink enough fluid to keep your urine clear or pale yellow. Summary  Exercising regularly is important for your overall health, especially when you have diabetes (diabetes mellitus).  Exercising has many health benefits, such as increasing muscle strength and bone density and reducing body fat and stress.  Your health care provider or certified diabetes educator can help you make a plan for the type and frequency of exercise (activity plan) that works for you.  When you start a new exercise or activity, work with your health care provider to make sure the activity is safe for you, and to adjust your insulin, medicines, or food intake as needed. This information is not intended to replace advice given to you by your health care provider. Make sure you discuss any questions you have with your health care provider. Document Revised: 07/18/2016 Document Reviewed: 06/05/2015 Elsevier Patient Education  2020 Elsevier Inc.  

## 2019-05-05 NOTE — Progress Notes (Signed)
This visit occurred during the SARS-CoV-2 public health emergency.  Safety protocols were in place, including screening questions prior to the visit, additional usage of staff PPE, and extensive cleaning of exam room while observing appropriate contact time as indicated for disinfecting solutions.  Subjective:     Patient ID: Kelly Lambert , male    DOB: Aug 15, 1950 , 69 y.o.   MRN: [id number]   Chief Complaint  Patient presents with  . Diabetes  . Shoulder Pain    bilateral    HPI  He presents today for diabetes check. He reports compliance with meds.  He is most concerned about b/l shoulder pain.  Started 8 months ago. He denies fall/trauma. He cannot recall what precipitated his pain. He thinks pain worsens at night. He has taken some Aleve which somewhat relieves the discomfort. Described as a dull, ache. He thinks sx due to arthritis.   Diabetes He presents for his follow-up diabetic visit. He has type 2 diabetes mellitus. There are no hypoglycemic associated symptoms. There are no diabetic associated symptoms. Pertinent negatives for diabetes include no blurred vision. There are no hypoglycemic complications. Risk factors for coronary artery disease include diabetes mellitus, dyslipidemia, hypertension and male sex.  Shoulder Pain  The pain is present in the left shoulder and right shoulder. This is a chronic problem. The current episode started more than 1 month ago. There has been no history of extremity trauma. The problem occurs constantly. The quality of the pain is described as dull and aching. The pain is at a severity of 4/10. The pain is moderate. Pertinent negatives include no inability to bear weight or joint swelling. The symptoms are aggravated by lying down. He has tried nothing for the symptoms.     Past Medical History:  Diagnosis Date  . BPH (benign prostatic hyperplasia)   . Diabetes mellitus without complication ([place])   . Hyperlipidemia associated with type 2  diabetes mellitus ([place])   . Hypertension      Family History  Problem Relation Age of Onset  . Cancer Mother   . Cancer Father   . Cancer Brother      Current Outpatient Medications:  .  amLODipine (NORVASC) 2.5 MG tablet, Take 1 tablet (2.5 mg total) by mouth daily. Take 1 tablet at night for high blood pressure, Disp: 90 tablet, Rfl: 2 .  aspirin EC 81 MG tablet, Take 81 mg by mouth daily., Disp: , Rfl:  .  Blood Glucose Monitoring Suppl (ACCU-CHEK GUIDE) w/Device KIT, 1 kit by Does not apply route 3 (three) times daily., Disp: 1 kit, Rfl: 3 .  dapagliflozin propanediol (FARXIGA) 10 MG TABS tablet, Take 10 mg by mouth daily before breakfast., Disp: 30 tablet, Rfl: 1 .  fluticasone (FLONASE) 50 MCG/ACT nasal spray, Place 1 spray into both nostrils daily., Disp: 16 g, Rfl: 2 .  gabapentin (NEURONTIN) 100 MG capsule, Take 1 capsule (100 mg total) by mouth at bedtime., Disp: 90 capsule, Rfl: 2 .  glucose blood (ACCU-CHEK GUIDE) test strip, Use to check blood sugar 3 times a day. Dx code E11.65, Disp: 300 each, Rfl: 2 .  Lancets (ACCU-CHEK MULTICLIX) lancets, Use to check blood sugars 3 times a day. Dx code: e11.65, Disp: 300 each, Rfl: 2 .  metFORMIN (GLUCOPHAGE) 1000 MG tablet, TAKE 1 TABLET TWICE DAILY WITH MORNING AND EVENING MEALS, Disp: 180 tablet, Rfl: 2 .  pantoprazole (PROTONIX) 40 MG tablet, Take 1 tablet (40 mg total) by mouth daily., Disp: 90 tablet, Rfl:  1 .  pravastatin (PRAVACHOL) 40 MG tablet, Take 1 tablet (40 mg total) by mouth daily., Disp: 90 tablet, Rfl: 2 .  tamsulosin (FLOMAX) 0.4 MG CAPS capsule, Take 1 capsule by mouth daily (Patient taking differently: Take 2 capsules by mouth daily), Disp: 90 capsule, Rfl: 1 .  valsartan (DIOVAN) 160 MG tablet, Take 1 tablet (160 mg total) by mouth daily., Disp: 90 tablet, Rfl: 2   No Known Allergies   Review of Systems  Constitutional: Negative.   Eyes: Negative for blurred vision.  Respiratory: Negative.   Cardiovascular:  Negative.   Gastrointestinal: Negative.   Musculoskeletal: Positive for arthralgias.  Neurological: Negative.   Psychiatric/Behavioral: Negative.      Today's Vitals   05/05/19 1104  BP: 114/72  Pulse: 95  Temp: 97.9 F (36.6 C)  TempSrc: Oral  Weight: 212 lb 6.4 oz (96.3 kg)  Height: 5' 10"  (1.778 m)  PainSc: 4   PainLoc: Shoulder   Body mass index is 30.48 kg/m.   Objective:  Physical Exam Vitals and nursing note reviewed.  Constitutional:      Appearance: Normal appearance.  Cardiovascular:     Rate and Rhythm: Normal rate and regular rhythm.     Heart sounds: Normal heart sounds.  Pulmonary:     Effort: Pulmonary effort is normal.     Breath sounds: Normal breath sounds.  Musculoskeletal:        General: No tenderness.     Comments: No pain with lateral abduction. Non-tender to palpation.   Skin:    General: Skin is warm.  Neurological:     General: No focal deficit present.     Mental Status: He is alert.  Psychiatric:        Mood and Affect: Mood normal.         Assessment And Plan:      1. Type 2 diabetes mellitus with stage 2 chronic kidney disease, without long-term current use of insulin (HCC)  Chronic, I will check labs as listed below. I will adjust meds as needed. He was given samples of Farxiga, 66m daily. If renal function is stable, I will send in rx for prior authorization.   - CMP14+EGFR - Hemoglobin A1c - Lipid panel  2. Chronic pain of both shoulders  I will refer him to Sports Med for further evaluation.   - Ambulatory referral to Sports Medicine  3. Hypertensive nephropathy  Chronic, well controlled. She will continue with current meds. He is encouraged to avoid adding salt to his foods.   4. Class 1 obesity due to excess calories with serious comorbidity and body mass index (BMI) of 30.0 to 30.9 in adult  He is encouraged to exercise at least 30 minutes five days per week. He is advised to strive for BMI less than 28 to  decrease cardiac risk.   5. Immunization due  He declined shingles vaccine.    RMaximino Greenland MD    THE PATIENT IS ENCOURAGED TO PRACTICE SOCIAL DISTANCING DUE TO THE COVID-19 PANDEMIC.

## 2019-05-06 LAB — LIPID PANEL
Chol/HDL Ratio: 3.9 ratio (ref 0.0–5.0)
Cholesterol, Total: 154 mg/dL (ref 100–199)
HDL: 39 mg/dL — ABNORMAL LOW (ref >39)
LDL Chol Calc (NIH): 87 mg/dL (ref 0–99)
Triglycerides: 158 mg/dL — ABNORMAL HIGH (ref 0–149)
VLDL Cholesterol Cal: 28 mg/dL (ref 5–40)

## 2019-05-06 LAB — CMP14+EGFR
ALT: 14 IU/L (ref 0–44)
AST: 12 IU/L (ref 0–40)
Albumin/Globulin Ratio: 1.6 (ref 1.2–2.2)
Albumin: 4.5 g/dL (ref 3.8–4.8)
Alkaline Phosphatase: 87 IU/L (ref 39–117)
BUN/Creatinine Ratio: 11 (ref 10–24)
BUN: 13 mg/dL (ref 8–27)
Bilirubin Total: <0.2 mg/dL (ref 0.0–1.2)
CO2: 22 mmol/L (ref 20–29)
Calcium: 10.1 mg/dL (ref 8.6–10.2)
Chloride: 106 mmol/L (ref 96–106)
Creatinine, Ser: 1.17 mg/dL (ref 0.76–1.27)
GFR calc Af Amer: 74 mL/min/{1.73_m2} (ref >59)
GFR calc non Af Amer: 64 mL/min/{1.73_m2} (ref >59)
Globulin, Total: 2.9 g/dL (ref 1.5–4.5)
Glucose: 162 mg/dL — ABNORMAL HIGH (ref 65–99)
Potassium: 4.3 mmol/L (ref 3.5–5.2)
Sodium: 143 mmol/L (ref 134–144)
Total Protein: 7.4 g/dL (ref 6.0–8.5)

## 2019-05-06 LAB — HEMOGLOBIN A1C
Est. average glucose Bld gHb Est-mCnc: 157 mg/dL
Hgb A1c MFr Bld: 7.1 % — ABNORMAL HIGH (ref 4.8–5.6)

## 2019-05-10 ENCOUNTER — Telehealth: Payer: Self-pay | Admitting: Internal Medicine

## 2019-05-10 NOTE — Chronic Care Management (AMB) (Signed)
  Care Management   Note  05/10/2019 Name: Miley Wamboldt MRN: [id number] DOB: 01-20-1950  Cayleb Trester is a 69 y.o. year old male who is a primary care patient of [name], MD and is actively engaged with the care management team. I reached out to [facility] by phone today to assist with scheduling an initial visit with the Pharmacist.  Follow up plan: Telephone appointment with care management team member scheduled for:05/25/2019.  [place], [place] [address] Direct Dial: [phone] [email or web address]: [email or web address]

## 2019-05-12 DIAGNOSIS — M67912 Unspecified disorder of synovium and tendon, left shoulder: Secondary | ICD-10-CM | POA: Diagnosis not present

## 2019-05-12 DIAGNOSIS — M67911 Unspecified disorder of synovium and tendon, right shoulder: Secondary | ICD-10-CM | POA: Diagnosis not present

## 2019-05-13 DIAGNOSIS — M7541 Impingement syndrome of right shoulder: Secondary | ICD-10-CM | POA: Diagnosis not present

## 2019-05-13 DIAGNOSIS — M7542 Impingement syndrome of left shoulder: Secondary | ICD-10-CM | POA: Diagnosis not present

## 2019-05-13 DIAGNOSIS — M6281 Muscle weakness (generalized): Secondary | ICD-10-CM | POA: Diagnosis not present

## 2019-05-17 DIAGNOSIS — M7541 Impingement syndrome of right shoulder: Secondary | ICD-10-CM | POA: Diagnosis not present

## 2019-05-17 DIAGNOSIS — M6281 Muscle weakness (generalized): Secondary | ICD-10-CM | POA: Diagnosis not present

## 2019-05-17 DIAGNOSIS — M7542 Impingement syndrome of left shoulder: Secondary | ICD-10-CM | POA: Diagnosis not present

## 2019-05-18 DIAGNOSIS — M67912 Unspecified disorder of synovium and tendon, left shoulder: Secondary | ICD-10-CM | POA: Diagnosis not present

## 2019-05-18 DIAGNOSIS — M67911 Unspecified disorder of synovium and tendon, right shoulder: Secondary | ICD-10-CM | POA: Diagnosis not present

## 2019-05-19 DIAGNOSIS — N401 Enlarged prostate with lower urinary tract symptoms: Secondary | ICD-10-CM | POA: Diagnosis not present

## 2019-05-19 DIAGNOSIS — R972 Elevated prostate specific antigen [PSA]: Secondary | ICD-10-CM | POA: Diagnosis not present

## 2019-05-19 DIAGNOSIS — R3912 Poor urinary stream: Secondary | ICD-10-CM | POA: Diagnosis not present

## 2019-05-21 ENCOUNTER — Other Ambulatory Visit: Payer: Self-pay | Admitting: Urology

## 2019-05-21 DIAGNOSIS — R972 Elevated prostate specific antigen [PSA]: Secondary | ICD-10-CM

## 2019-05-24 DIAGNOSIS — M6281 Muscle weakness (generalized): Secondary | ICD-10-CM | POA: Diagnosis not present

## 2019-05-24 DIAGNOSIS — M7541 Impingement syndrome of right shoulder: Secondary | ICD-10-CM | POA: Diagnosis not present

## 2019-05-24 DIAGNOSIS — M7542 Impingement syndrome of left shoulder: Secondary | ICD-10-CM | POA: Diagnosis not present

## 2019-05-25 ENCOUNTER — Ambulatory Visit: Payer: Medicare HMO

## 2019-05-25 ENCOUNTER — Ambulatory Visit: Payer: Medicare PPO

## 2019-05-25 ENCOUNTER — Other Ambulatory Visit: Payer: Self-pay

## 2019-05-25 DIAGNOSIS — E7800 Pure hypercholesterolemia, unspecified: Secondary | ICD-10-CM

## 2019-05-25 DIAGNOSIS — N182 Chronic kidney disease, stage 2 (mild): Secondary | ICD-10-CM

## 2019-05-25 DIAGNOSIS — I129 Hypertensive chronic kidney disease with stage 1 through stage 4 chronic kidney disease, or unspecified chronic kidney disease: Secondary | ICD-10-CM

## 2019-05-25 DIAGNOSIS — E1122 Type 2 diabetes mellitus with diabetic chronic kidney disease: Secondary | ICD-10-CM

## 2019-05-25 NOTE — Chronic Care Management (AMB) (Signed)
Chronic Care Management Pharmacy  Name: Kelly Lambert  MRN: [id number] DOB: November 28, 1950  Chief Complaint/ HPI  Kelly Lambert,  69 y.o. , male presents for their Initial CCM visit with the clinical pharmacist via telephone due to COVID-19 Pandemic.  PCP : Kelly Chard, MD  Their chronic conditions include: Diabetes with Stage 2 CKD, Hypertensive nephropathy, Pure hypercholesterolemia  Office Visits: 05/05/19 OV: Presented for diabetes check and complaining of bilateral shoulder pain (dull, aching) which started 8 months ago. Samples of Farxiga given, will give Rx if renal function stable. Labs ordered (CMP14+EGFR, HgbA1c, Lipid panel). Referred to Sports Medicine for shoulder pain. Shingles vaccine declined.   03/01/19 OV: Presented for follow up visit regarding diabetes and hypertension. Pt has not started [place]. Tolerating amlodipine 2.98m well (started at last nurse visit). Start Farxiga 571m(add to metformin), will titrate to 1037mValsartan/HCTZ changed to valsartan alone due to adding Far[place]ollow up in 4 weeks after starting medication. Referred to GI for colonoscopy.   02/09/19 CMA BP Check: Start amlodipine 2.5mg75mily due to BP 148/82 manual, 156/87 pt's cuff. Follow up for OV in 2 weeks.   02/04/19 Telephone: Pt left message stating he was going back to whole tablet of BP medication because he woke up with a headache and his BP was 140/98.  02/01/19 OV: Presented for diabetes evaluation. Pt reported blood sugars have been above 150 (150-180) for several weeks. Pt denied change in diet or recent illness. Labs ordered (BMP8+EGFR, HgbA1c). Plan to start on Farxiga 5mg 78me daily after HgbA1c review. Will require insulin if A1c above 9. Advised to cut valsartan/HCTZ in half due to orthostatic hypotension. Follow up in 4 weeks.   Consult Visit: 02/18/19 OV w/ LarryJiles Lambert BPH  CCM Encounters: 12/25/18 RN: Evaluation of current treatment regimens and chronic conditions.  Patient education provided and resources suggested.   12/11/18 PharmD: Discussed GLP-1 as possible treatment option. Pt to consider and research prior to next PCP appt. Comprehensive medication review performed   Medications: Outpatient Encounter Medications as of 05/25/2019  Medication Sig  . amLODipine (NORVASC) 2.5 MG tablet Take 1 tablet (2.5 mg total) by mouth daily. Take 1 tablet at night for high blood pressure  . aspirin EC 81 MG tablet Take 81 mg by mouth daily.  . Blood Glucose Monitoring Suppl (ACCU-CHEK GUIDE) w/Device KIT 1 kit by Does not apply route 3 (three) times daily.  . gabMarland Kitchenpentin (NEURONTIN) 100 MG capsule Take 1 capsule (100 mg total) by mouth at bedtime.  . gluMarland Kitchenose blood (ACCU-CHEK GUIDE) test strip Use to check blood sugar 3 times a day. Dx code E11.65  . Lancets (ACCU-CHEK MULTICLIX) lancets Use to check blood sugars 3 times a day. Dx code: e11.65  . metFORMIN (GLUCOPHAGE) 1000 MG tablet TAKE 1 TABLET TWICE DAILY WITH MORNING AND EVENING MEALS  . Multiple Vitamin (MULTIVITAMIN) tablet Take 1 tablet by mouth daily.  . pantoprazole (PROTONIX) 40 MG tablet Take 1 tablet (40 mg total) by mouth daily.  . pravastatin (PRAVACHOL) 40 MG tablet Take 1 tablet (40 mg total) by mouth daily.  . tamsulosin (FLOMAX) 0.4 MG CAPS capsule Take 1 capsule by mouth daily (Patient taking differently: Take 0.4 mg by mouth 2 (two) times daily. )  . valsartan (DIOVAN) 160 MG tablet Take 1 tablet (160 mg total) by mouth daily.  . dapagliflozin propanediol (FARXIGA) 10 MG TABS tablet Take 10 mg by mouth daily before breakfast. (Patient not taking: Reported on 05/25/2019)  . fluticasone (  FLONASE) 50 MCG/ACT nasal spray Place 1 spray into both nostrils daily.   No facility-administered encounter medications on file as of 05/25/2019.   Current Diagnosis/Assessment:  SDOH Interventions     Most Recent Value  SDOH Interventions  SDOH Interventions for the Following Domains  Physical Activity    Physical Activity Interventions  [facility] [employer], Other (Comments) [Encouraged 30 minutes of exercise 5 times per week]      Goals Addressed            This Visit's Progress   . Pharmacy Care Plan       CARE PLAN ENTRY  Current Barriers:  . Chronic Disease Management support, education, and care coordination needs related to Hypertension, Hyperlipidemia, and Diabetes   Hypertension . Pharmacist Clinical Goal(s): o Over the next 90 days, patient will work with PharmD and providers to maintain BP goal <130/80 . Current regimen:  o Amlodipine 2.30m daily o Valsartan 1631mdaily . Interventions: o Dietary and exercise recommendations provided - Limit salt intake - Increase physical activity to 30 minutes 3 times weekly, with a long term goal of 30 minutes 5 times weekly . Patient self care activities - Over the next 90 days, patient will: o Check BP twice weekly and if symptomatic, document, and provide at future appointments o Ensure daily salt intake < 2300 mg/day  Hyperlipidemia . Pharmacist Clinical Goal(s): o Over the next 90 days, patient will work with PharmD and providers to achieve LDL goal < 70 . Current regimen:  o Pravastatin 4074maily . Interventions: o Dietary and exercise recommendations provided - Increase intake of healthy fats (i.e avocados, walnuts, flaxseed) - Decrease intake of foods high in saturated and trans fat (i.e. chips) . Patient self care activities - Over the next 90 days, patient will: o Work to improve diet, increase heart healthy fats and decrease trans/saturated fats  Diabetes . Pharmacist Clinical Goal(s): o Over the next 90 days, patient will work with PharmD and providers to achieve A1c goal <7% . Current regimen:  o Metformin 1000m28mice daily . Interventions: o Will discuss restarting Farxiga 5mg 48mly with PCP. Patient states that he cannot tolerate Farxiga 10mg 24muse it make him urinate too frequently . Patient self care  activities - Over the next 90 days, patient will: o Check blood sugar once daily, document, and provide at future appointments o Contact provider with any episodes of hypoglycemia  GERD . Pharmacist Clinical Goal(s) o Over the next 90 days, patient will work with PharmD and providers to manage GERD symptoms . Current regimen:  o Pantoprazole 40mg d31m . Interventions: o Patient expressed desire to take less medications. He has not been experiencing symptoms of acid reflux. Recommend patient stop pantoprazole to determine if he still needs it o Recommend avoiding foods that may trigger heartburn . Patient self care activities - Over the next 90 days, patient will: o Stop pantoprazole o Avoid foods that cause heartburn  Medication management . Pharmacist Clinical Goal(s): o Over the next 90 days, patient will work with PharmD and providers to achieve optimal medication adherence . [facility] . Interventions o Comprehensive medication review performed. o Continue current medication management strategy . Patient self care activities - Over the next 90 days, patient will: o Focus on medication adherence by using a pill box to organize medications o Take medications as prescribed o Report any questions or concerns to PharmD and/or provider(s)  Initial goal documentation  Diabetes   Recent Relevant Labs: Lab Results  Component Value Date/Time   HGBA1C 7.1 (H) 05/05/2019 11:53 AM   HGBA1C 7.7 (H) 02/01/2019 02:37 PM   MICROALBUR 10 07/02/2018 12:35 PM    Checking BG: Daily  Recent FBG Readings: 90 Recent pre-meal BG readings:  Recent 2hr PP BG readings:  147, 114, 120 Recent HS BG readings:  Patient has failed these meds in past: N/A Patient is currently uncontrolled on the following medications:  -Farxiga 56m daily -Metformin 10035mtwice daily  Last diabetic Foot exam: 07/02/18 Last diabetic Eye exam: 10/09/18 Lab Results  Component  Value Date/Time   HMDIABEYEEXA No Retinopathy 10/09/2018 12:00 AM    We discussed:  -Diet extensively  Pt eats 3 meals a day  Breakfast: Toast, coffee  Lunch: Cereal  Dinner: Chicken, green vegetable, mashed potatoes  Snacks: Chips. Pretzels, bananas, apples  Drinks: 4-5 16oz bottles of water daily, rarely soda  Recommend limiting carbohydrates to help reach A1c goal -Exercise extensively  Pt reports not working out much lately  He has started physical therapy (10 sessions) for shoulder  Pain and also received shots in shoulder  Pt states he tries to get 10,000 steps everyday  Member at [employer] goal is 30 minutes 5 times per week  Recommend pt try to get 30 minutes of exercise 3 times per  week and work his way up to 5 times -Pt states he felt dizzy the other day when blood sugar was low (did not check it), ate bowl of cereal and felt better. He also has orange juice at the house.  -Encouraged patient to check blood sugar when he feels symptomatic and thinks it is low -Pt states he has stopped taking Farxiga 1034maily. He reports that it made him urinate frequently ("every hour"). He states he did not experience these symptoms with the 5mg20mse  Plan -Continue current medications  -Discuss with PCP restarting Farxiga 5mg 41me daily    Hypertension   Office blood pressures are  BP Readings from Last 3 Encounters:  05/05/19 114/72  03/01/19 112/74  02/09/19 (!) 148/82   Patient has failed these meds in the past: Valsartan/HCTZ Patient is currently controlled on the following medications:  -Amlodipine 2.5mg d73my -Valsartan 160mg d43m  Patient checks BP at home 1-2x per week  Patient home BP readings are ranging: 114/74, usually normal  We discussed: -Diet and exercise extensively -Check blood pressure when symptomatic (dizziness, headache, etc) and at least twice weekly  Plan -Continue current medications   Hyperlipidemia   Lipid  Panel     Component Value Date/Time   CHOL 154 05/05/2019 1153   TRIG 158 (H) 05/05/2019 1153   HDL 39 (L) 05/05/2019 1153   CHOLHDL 3.9 05/05/2019 1153   LDLCALC 87 05/05/2019 1153   LABVLDL 28 05/05/2019 1153    The 10-year ASCVD risk score (Goff DMikey Bussing, et al., 2013) is: 25.8%   Values used to calculate the score:     Age: 6 year7    Sex: Male     Is Non-Hispanic African American: Yes     Diabetic: Yes     Tobacco smoker: No     Systolic Blood Pressure: 114 mmH606   Is BP treated: Yes     HDL Cholesterol: 39 mg/dL     Total Cholesterol: 154 mg/dL   Patient has failed these meds in past: N/A Patient is currently uncontrolled on the following medications:  -  Pravastatin 38m daily  We discussed:  -Diet and exercise extensively -LDL goal is <70 due to ASCVD risk > 20%, DM, CKD -HDL is below goal (Goal > 40). Recommend increasing dietary intake of healthy fats (I.e. avocados, walnuts, flaxseed) and increase exercise -Triglycerides are above goal (Goal < 150). Recommend decrease snacking on foods high fat (I.e. chips)  Plan Continue current medications  GERD   Patient has failed these meds in past: N/A Patient is currently controlled on the following medications:  -Pantoprazole 478mdaily  We discussed:   -Pt wants to take less medications -Stopping pantoprazole for a few weeks to determine if therapy is still needed -Pt agrees and thinks he can go without it.  -Recommend pt avoid foods that cause heartburn and indigestion  Plan Stop pantoprazole therapy Follow up with patient in 3 months regarding symptoms of GERD  BPH  Managed by Urology  Patient has failed these meds in past: N/A Patient is currently controlled on the following medications:  -Tamsulosin 0.3m67m capsule twice daily  Plan -Continue current medications  Pain   Patient is currently controlled on the following medications:  -Gabapentin 100m15m bedtime  We discussed:   -Denies any side  effects at this time  Plan -Continue current medications  Vaccines   Reviewed and discussed patient's vaccination history.    Immunization History  Administered Date(s) Administered  . Fluad Quad(high Dose 65+) 09/18/2018  . Influenza-Unspecified 09/18/2018, 09/30/2018  . PFIZER SARS-COV-2 Vaccination 02/13/2019, 03/06/2019   Discussed both pneumonia vaccine sand Shingrix. Patient open to receiving these vaccines.  Plan Recommended patient receive pneumonia vaccines Recommend Shingrix   Medication Management   Pt [facility] for all medications Uses pill box? No - Organize in a drawer by bottles morning and night, only uses a pill box when traveling Pt endorses 95% compliance, forgets maybe once a month (never forgets AM meds but sometimes PM meds  We discussed:  -Importance of taking medications every day as directed -Encouraged use of pill box  Plan Continue current medication management strategy   Follow up: 3 month phone visit  CourJannette FogoarmD Clinical Pharmacist [facility] [phone]

## 2019-05-26 DIAGNOSIS — M7541 Impingement syndrome of right shoulder: Secondary | ICD-10-CM | POA: Diagnosis not present

## 2019-05-26 DIAGNOSIS — M7542 Impingement syndrome of left shoulder: Secondary | ICD-10-CM | POA: Diagnosis not present

## 2019-05-26 DIAGNOSIS — M6281 Muscle weakness (generalized): Secondary | ICD-10-CM | POA: Diagnosis not present

## 2019-05-28 ENCOUNTER — Telehealth: Payer: Self-pay

## 2019-06-02 DIAGNOSIS — M7542 Impingement syndrome of left shoulder: Secondary | ICD-10-CM | POA: Diagnosis not present

## 2019-06-02 DIAGNOSIS — M6281 Muscle weakness (generalized): Secondary | ICD-10-CM | POA: Diagnosis not present

## 2019-06-02 DIAGNOSIS — M7541 Impingement syndrome of right shoulder: Secondary | ICD-10-CM | POA: Diagnosis not present

## 2019-06-07 NOTE — Patient Instructions (Addendum)
Visit Information  Goals Addressed            This Visit's Progress   . Pharmacy Care Plan       CARE PLAN ENTRY  Current Barriers:  . Chronic Disease Management support, education, and care coordination needs related to Hypertension, Hyperlipidemia, and Diabetes   Hypertension . Pharmacist Clinical Goal(s): o Over the next 90 days, patient will work with PharmD and providers to maintain BP goal <130/80 . Current regimen:  o Amlodipine 2.5mg  daily o Valsartan 160mg  daily . Interventions: o Dietary and exercise recommendations provided - Limit salt intake - Increase physical activity to 30 minutes 3 times weekly, with a long term goal of 30 minutes 5 times weekly . Patient self care activities - Over the next 90 days, patient will: o Check BP twice weekly and if symptomatic, document, and provide at future appointments o Ensure daily salt intake < 2300 mg/day  Hyperlipidemia . Pharmacist Clinical Goal(s): o Over the next 90 days, patient will work with PharmD and providers to achieve LDL goal < 70 . Current regimen:  o Pravastatin 40mg  daily . Interventions: o Dietary and exercise recommendations provided - Increase intake of healthy fats (i.e avocados, walnuts, flaxseed) - Decrease intake of foods high in saturated and trans fat (i.e. chips) . Patient self care activities - Over the next 90 days, patient will: o Work to improve diet, increase heart healthy fats and decrease trans/saturated fats  Diabetes . Pharmacist Clinical Goal(s): o Over the next 90 days, patient will work with PharmD and providers to achieve A1c goal <7% . Current regimen:  o Metformin 1000mg  twice daily . Interventions: o Will discuss restarting Farxiga 5mg  daily with PCP. Patient states that he cannot tolerate Farxiga 10mg  because it make him urinate too frequently . Patient self care activities - Over the next 90 days, patient will: o Check blood sugar once daily, document, and provide at  future appointments o Contact provider with any episodes of hypoglycemia  GERD . Pharmacist Clinical Goal(s) o Over the next 90 days, patient will work with PharmD and providers to manage GERD symptoms . Current regimen:  o Pantoprazole 40mg  daily . Interventions: o Patient expressed desire to take less medications. He has not been experiencing symptoms of acid reflux. Recommend patient stop pantoprazole to determine if he still needs it o Recommend avoiding foods that may trigger heartburn . Patient self care activities - Over the next 90 days, patient will: o Stop pantoprazole o Avoid foods that cause heartburn  Medication management . Pharmacist Clinical Goal(s): o Over the next 90 days, patient will work with PharmD and providers to achieve optimal medication adherence . Current pharmacy: [facility] . Interventions o Comprehensive medication review performed. o Continue current medication management strategy . Patient self care activities - Over the next 90 days, patient will: o Focus on medication adherence by using a pill box to organize medications o Take medications as prescribed o Report any questions or concerns to PharmD and/or provider(s)  Initial goal documentation        Mr. [place] was given information about Chronic Care Management services today including:  1. CCM service includes personalized support from designated clinical staff supervised by his physician, including individualized plan of care and coordination with other care providers 2. 24/7 contact phone numbers for assistance for urgent and routine care needs. 3. Standard insurance, coinsurance, copays and deductibles apply for chronic care management only during months in which we provide at  least 20 minutes of these services. Most insurances cover these services at 100%, however patients may be responsible for any copay, coinsurance and/or deductible if applicable. This service may help you  avoid the need for more expensive face-to-face services. 4. Only one practitioner may furnish and bill the service in a calendar month. 5. The patient may stop CCM services at any time (effective at the end of the month) by phone call to the office staff.  Patient agreed to services and verbal consent obtained.   The patient verbalized understanding of instructions provided today and agreed to receive a mailed copy of patient instruction and/or educational materials. Telephone follow up appointment with pharmacy team member scheduled for: 09/02/19 @ 8:30AM  Jannette Fogo, PharmD Clinical Pharmacist [facility] [phone]   Food Choices for Gastroesophageal Reflux Disease, Adult When you have gastroesophageal reflux disease (GERD), the foods you eat and your eating habits are very important. Choosing the right foods can help ease the discomfort of GERD. Consider working with a diet and nutrition specialist (dietitian) to help you make healthy food choices. What general guidelines should I follow?  Eating plan  Choose healthy foods low in fat, such as fruits, vegetables, whole grains, low-fat dairy products, and lean meat, fish, and poultry.  Eat frequent, small meals instead of three large meals each day. Eat your meals slowly, in a relaxed setting. Avoid bending over or lying down until 2-3 hours after eating.  Limit high-fat foods such as fatty meats or fried foods.  Limit your intake of oils, butter, and shortening to less than 8 teaspoons each day.  Avoid the following: ? Foods that cause symptoms. These may be different for different people. Keep a food diary to keep track of foods that cause symptoms. ? Alcohol. ? Drinking large amounts of liquid with meals. ? Eating meals during the 2-3 hours before bed.  Cook foods using methods other than frying. This may include baking, grilling, or broiling. Lifestyle  Maintain a healthy weight. Ask your  health care provider what weight is healthy for you. If you need to lose weight, work with your health care provider to do so safely.  Exercise for at least 30 minutes on 5 or more days each week, or as told by your health care provider.  Avoid wearing clothes that fit tightly around your waist and chest.  Do not use any products that contain nicotine or tobacco, such as cigarettes and e-cigarettes. If you need help quitting, ask your health care provider.  Sleep with the head of your bed raised. Use a wedge under the mattress or blocks under the bed frame to raise the head of the bed. What foods are not recommended? The items listed may not be a complete list. Talk with your dietitian about what dietary choices are best for you. Grains Pastries or quick breads with added fat. [place] toast. Vegetables Deep fried vegetables. [place] fries. Any vegetables prepared with added fat. Any vegetables that cause symptoms. For some people this may include tomatoes and tomato products, chili peppers, onions and garlic, and horseradish. Fruits Any fruits prepared with added fat. Any fruits that cause symptoms. For some people this may include citrus fruits, such as oranges, grapefruit, pineapple, and lemons. Meats and other protein foods High-fat meats, such as fatty beef or pork, hot dogs, ribs, ham, sausage, salami and bacon. Fried meat or protein, including fried fish and fried chicken. Nuts and nut butters. Dairy Whole milk and chocolate milk. Sour  cream. Cream. Ice cream. Cream cheese. Milk shakes. Beverages Coffee and tea, with or without caffeine. Carbonated beverages. Sodas. Energy drinks. Fruit juice made with acidic fruits (such as orange or grapefruit). Tomato juice. Alcoholic drinks. Fats and oils Butter. Margarine. Shortening. Ghee. Sweets and desserts Chocolate and cocoa. Donuts. Seasoning and other foods Pepper. Peppermint and spearmint. Any condiments, herbs, or seasonings that cause  symptoms. For some people, this may include curry, hot sauce, or vinegar-based salad dressings. Summary  When you have gastroesophageal reflux disease (GERD), food and lifestyle choices are very important to help ease the discomfort of GERD.  Eat frequent, small meals instead of three large meals each day. Eat your meals slowly, in a relaxed setting. Avoid bending over or lying down until 2-3 hours after eating.  Limit high-fat foods such as fatty meat or fried foods. This information is not intended to replace advice given to you by your health care provider. Make sure you discuss any questions you have with your health care provider. Document Revised: 04/16/2018 Document Reviewed: 12/26/2015 Elsevier Patient Education  [employer].

## 2019-06-08 DIAGNOSIS — M7542 Impingement syndrome of left shoulder: Secondary | ICD-10-CM | POA: Diagnosis not present

## 2019-06-08 DIAGNOSIS — M6281 Muscle weakness (generalized): Secondary | ICD-10-CM | POA: Diagnosis not present

## 2019-06-08 DIAGNOSIS — M7541 Impingement syndrome of right shoulder: Secondary | ICD-10-CM | POA: Diagnosis not present

## 2019-06-10 DIAGNOSIS — M6281 Muscle weakness (generalized): Secondary | ICD-10-CM | POA: Diagnosis not present

## 2019-06-10 DIAGNOSIS — M7542 Impingement syndrome of left shoulder: Secondary | ICD-10-CM | POA: Diagnosis not present

## 2019-06-10 DIAGNOSIS — M7541 Impingement syndrome of right shoulder: Secondary | ICD-10-CM | POA: Diagnosis not present

## 2019-06-22 ENCOUNTER — Other Ambulatory Visit: Payer: Self-pay

## 2019-06-22 ENCOUNTER — Ambulatory Visit
Admission: RE | Admit: 2019-06-22 | Discharge: 2019-06-22 | Disposition: A | Discharge status: Home / Self Care | Payer: Medicare PPO | Source: Ambulatory Visit | Attending: Urology | Admitting: Urology

## 2019-06-22 DIAGNOSIS — R972 Elevated prostate specific antigen [PSA]: Secondary | ICD-10-CM

## 2019-06-22 MED ORDER — GADOBENATE DIMEGLUMINE 529 MG/ML IV SOLN
19.0000 mL | Freq: Once | INTRAVENOUS | Status: AC | PRN
  Administered 2019-06-22: 19 mL via INTRAVENOUS

## 2019-06-29 ENCOUNTER — Telehealth: Payer: Self-pay

## 2019-07-07 ENCOUNTER — Encounter: Payer: Medicare HMO | Admitting: Internal Medicine

## 2019-07-07 ENCOUNTER — Ambulatory Visit: Payer: Medicare HMO

## 2019-07-27 ENCOUNTER — Other Ambulatory Visit: Payer: Self-pay | Admitting: Internal Medicine

## 2019-07-27 ENCOUNTER — Other Ambulatory Visit: Payer: Self-pay

## 2019-07-27 MED ORDER — VALSARTAN 160 MG PO TABS: 160.0000 mg | ORAL_TABLET | Freq: Every day | ORAL | 2 refills | Status: DC

## 2019-07-27 MED ORDER — PRAVASTATIN SODIUM 40 MG PO TABS: 40.0000 mg | ORAL_TABLET | Freq: Every day | ORAL | 2 refills | Status: DC

## 2019-07-27 MED ORDER — AMLODIPINE BESYLATE 2.5 MG PO TABS: 2.5000 mg | ORAL_TABLET | Freq: Every day | ORAL | 2 refills | Status: DC

## 2019-07-27 MED ORDER — GABAPENTIN 100 MG PO CAPS: 100.0000 mg | ORAL_CAPSULE | Freq: Every day | ORAL | 2 refills | Status: DC

## 2019-07-28 ENCOUNTER — Ambulatory Visit (INDEPENDENT_AMBULATORY_CARE_PROVIDER_SITE_OTHER): Payer: Medicare HMO | Admitting: Internal Medicine

## 2019-07-28 ENCOUNTER — Other Ambulatory Visit: Payer: Self-pay

## 2019-07-28 ENCOUNTER — Encounter: Payer: Self-pay | Admitting: Internal Medicine

## 2019-07-28 ENCOUNTER — Ambulatory Visit (INDEPENDENT_AMBULATORY_CARE_PROVIDER_SITE_OTHER): Payer: Medicare HMO

## 2019-07-28 VITALS — BP 126/84 | HR 72 | Temp 98.0°F | Ht 69.4 in | Wt 211.8 lb

## 2019-07-28 VITALS — BP 126/84 | HR 72 | Temp 98.0°F | Ht 69.4 in | Wt 211.0 lb

## 2019-07-28 DIAGNOSIS — E6609 Other obesity due to excess calories: Secondary | ICD-10-CM

## 2019-07-28 DIAGNOSIS — M25571 Pain in right ankle and joints of right foot: Secondary | ICD-10-CM

## 2019-07-28 DIAGNOSIS — E1122 Type 2 diabetes mellitus with diabetic chronic kidney disease: Secondary | ICD-10-CM | POA: Diagnosis not present

## 2019-07-28 DIAGNOSIS — N182 Chronic kidney disease, stage 2 (mild): Secondary | ICD-10-CM

## 2019-07-28 DIAGNOSIS — E559 Vitamin D deficiency, unspecified: Secondary | ICD-10-CM | POA: Diagnosis not present

## 2019-07-28 DIAGNOSIS — Z23 Encounter for immunization: Secondary | ICD-10-CM

## 2019-07-28 DIAGNOSIS — I129 Hypertensive chronic kidney disease with stage 1 through stage 4 chronic kidney disease, or unspecified chronic kidney disease: Secondary | ICD-10-CM | POA: Diagnosis not present

## 2019-07-28 DIAGNOSIS — Z0000 Encounter for general adult medical examination without abnormal findings: Secondary | ICD-10-CM | POA: Diagnosis not present

## 2019-07-28 DIAGNOSIS — M25572 Pain in left ankle and joints of left foot: Secondary | ICD-10-CM

## 2019-07-28 DIAGNOSIS — M542 Cervicalgia: Secondary | ICD-10-CM

## 2019-07-28 DIAGNOSIS — G8929 Other chronic pain: Secondary | ICD-10-CM | POA: Diagnosis not present

## 2019-07-28 DIAGNOSIS — Z6830 Body mass index (BMI) 30.0-30.9, adult: Secondary | ICD-10-CM | POA: Diagnosis not present

## 2019-07-28 DIAGNOSIS — Z7982 Long term (current) use of aspirin: Secondary | ICD-10-CM

## 2019-07-28 LAB — POCT URINALYSIS DIPSTICK
Bilirubin, UA: NEGATIVE
Blood, UA: NEGATIVE
Glucose, UA: NEGATIVE
Ketones, UA: NEGATIVE
Leukocytes, UA: NEGATIVE
Nitrite, UA: NEGATIVE
Protein, UA: NEGATIVE
Spec Grav, UA: 1.015 (ref 1.010–1.025)
Urobilinogen, UA: 0.2 E.U./dL
pH, UA: 6 (ref 5.0–8.0)

## 2019-07-28 LAB — POCT UA - MICROALBUMIN
Albumin/Creatinine Ratio, Urine, POC: <30
Creatinine, POC: 200 mg/dL
Microalbumin Ur, POC: 30 mg/L

## 2019-07-28 MED ORDER — TETANUS-DIPHTH-ACELL PERTUSSIS 5-2.5-18.5 LF-MCG/0.5 IM SUSP: 0.5000 mL | Freq: Once | INTRAMUSCULAR | 0 refills | Status: AC

## 2019-07-28 NOTE — Patient Instructions (Signed)

## 2019-07-28 NOTE — Patient Instructions (Signed)
[name] , Thank you for taking time to come for your Medicare Wellness Visit. I appreciate your ongoing commitment to your health goals. Please review the following plan we discussed and let me know if I can assist you in the future.   Screening recommendations/referrals: Colonoscopy: completed 03/18/2019  Recommended yearly ophthalmology/optometry visit for glaucoma screening and checkup Recommended yearly dental visit for hygiene and checkup  Vaccinations: Influenza vaccine: completed 09/30/2018, due 08/08/2019 Pneumococcal vaccine: due Tdap vaccine: sent to pharmacy Shingles vaccine: discussed   Covid-19: 03/06/2019, 02/13/2019  Advanced directives: Advance directive discussed with you today. Even though you declined this today please call our office should you change your mind and we can give you the proper paperwork for you to fill out.   Conditions/risks identified: none  Next appointment: 02/02/2020 at 2:00 Follow up in one year for your annual wellness visit.    Preventive Care 68 Years and Older, Male Preventive care refers to lifestyle choices and visits with your health care provider that can promote health and wellness. What does preventive care include?  A yearly physical exam. This is also called an annual well check.  Dental exams once or twice a year.  Routine eye exams. Ask your health care provider how often you should have your eyes checked.  Personal lifestyle choices, including:  Daily care of your teeth and gums.  Regular physical activity.  Eating a healthy diet.  Avoiding tobacco and drug use.  Limiting alcohol use.  Practicing safe sex.  Taking low doses of aspirin every day.  Taking vitamin and mineral supplements as recommended by your health care provider. What happens during an annual well check? The services and screenings done by your health care provider during your annual well check will depend on your age, overall health, lifestyle  risk factors, and family history of disease. Counseling  Your health care provider may ask you questions about your:  Alcohol use.  Tobacco use.  Drug use.  Emotional well-being.  Home and relationship well-being.  Sexual activity.  Eating habits.  History of falls.  Memory and ability to understand (cognition).  Work and work Statistician. Screening  You may have the following tests or measurements:  Height, weight, and BMI.  Blood pressure.  Lipid and cholesterol levels. These may be checked every 5 years, or more frequently if you are over 53 years old.  Skin check.  Lung cancer screening. You may have this screening every year starting at age 40 if you have a 30-pack-year history of smoking and currently smoke or have quit within the past 15 years.  Fecal occult blood test (FOBT) of the stool. You may have this test every year starting at age 14.  Flexible sigmoidoscopy or colonoscopy. You may have a sigmoidoscopy every 5 years or a colonoscopy every 10 years starting at age 67.  Prostate cancer screening. Recommendations will vary depending on your family history and other risks.  Hepatitis C blood test.  Hepatitis B blood test.  Sexually transmitted disease (STD) testing.  Diabetes screening. This is done by checking your blood sugar (glucose) after you have not eaten for a while (fasting). You may have this done every 1-3 years.  Abdominal aortic aneurysm (AAA) screening. You may need this if you are a current or former smoker.  Osteoporosis. You may be screened starting at age 2 if you are at high risk. Talk with your health care provider about your test results, treatment options, and if necessary, the need for  more tests. Vaccines  Your health care provider may recommend certain vaccines, such as:  Influenza vaccine. This is recommended every year.  Tetanus, diphtheria, and acellular pertussis (Tdap, Td) vaccine. You may need a Td booster every 10  years.  Zoster vaccine. You may need this after age 34.  Pneumococcal 13-valent conjugate (PCV13) vaccine. One dose is recommended after age 1.  Pneumococcal polysaccharide (PPSV23) vaccine. One dose is recommended after age 11. Talk to your health care provider about which screenings and vaccines you need and how often you need them. This information is not intended to replace advice given to you by your health care provider. Make sure you discuss any questions you have with your health care provider. Document Released: 01/20/2015 Document Revised: 09/13/2015 Document Reviewed: 10/25/2014 Elsevier Interactive Patient Education  2017 [employer] Prevention in the Home Falls can cause injuries. They can happen to people of all ages. There are many things you can do to make your home safe and to help prevent falls. What can I do on the outside of my home?  Regularly fix the edges of walkways and driveways and fix any cracks.  Remove anything that might make you trip as you walk through a door, such as a raised step or threshold.  Trim any bushes or trees on the path to your home.  Use bright outdoor lighting.  Clear any walking paths of anything that might make someone trip, such as rocks or tools.  Regularly check to see if handrails are loose or broken. Make sure that both sides of any steps have handrails.  Any raised decks and porches should have guardrails on the edges.  Have any leaves, snow, or ice cleared regularly.  Use sand or salt on walking paths during winter.  Clean up any spills in your garage right away. This includes oil or grease spills. What can I do in the bathroom?  Use night lights.  Install grab bars by the toilet and in the tub and shower. Do not use towel bars as grab bars.  Use non-skid mats or decals in the tub or shower.  If you need to sit down in the shower, use a plastic, non-slip stool.  Keep the floor dry. Clean up any water that  spills on the floor as soon as it happens.  Remove soap buildup in the tub or shower regularly.  Attach bath mats securely with double-sided non-slip rug tape.  Do not have throw rugs and other things on the floor that can make you trip. What can I do in the bedroom?  Use night lights.  Make sure that you have a light by your bed that is easy to reach.  Do not use any sheets or blankets that are too big for your bed. They should not hang down onto the floor.  Have a firm chair that has side arms. You can use this for support while you get dressed.  Do not have throw rugs and other things on the floor that can make you trip. What can I do in the kitchen?  Clean up any spills right away.  Avoid walking on wet floors.  Keep items that you use a lot in easy-to-reach places.  If you need to reach something above you, use a strong step stool that has a grab bar.  Keep electrical cords out of the way.  Do not use floor polish or wax that makes floors slippery. If you must use wax, use non-skid  floor wax.  Do not have throw rugs and other things on the floor that can make you trip. What can I do with my stairs?  Do not leave any items on the stairs.  Make sure that there are handrails on both sides of the stairs and use them. Fix handrails that are broken or loose. Make sure that handrails are as long as the stairways.  Check any carpeting to make sure that it is firmly attached to the stairs. Fix any carpet that is loose or worn.  Avoid having throw rugs at the top or bottom of the stairs. If you do have throw rugs, attach them to the floor with carpet tape.  Make sure that you have a light switch at the top of the stairs and the bottom of the stairs. If you do not have them, ask someone to add them for you. What else can I do to help prevent falls?  Wear shoes that:  Do not have high heels.  Have rubber bottoms.  Are comfortable and fit you well.  Are closed at the  toe. Do not wear sandals.  If you use a stepladder:  Make sure that it is fully opened. Do not climb a closed stepladder.  Make sure that both sides of the stepladder are locked into place.  Ask someone to hold it for you, if possible.  Clearly mark and make sure that you can see:  Any grab bars or handrails.  First and last steps.  Where the edge of each step is.  Use tools that help you move around (mobility aids) if they are needed. These include:  Canes.  Walkers.  Scooters.  Crutches.  Turn on the lights when you go into a dark area. Replace any light bulbs as soon as they burn out.  Set up your furniture so you have a clear path. Avoid moving your furniture around.  If any of your floors are uneven, fix them.  If there are any pets around you, be aware of where they are.  Review your medicines with your doctor. Some medicines can make you feel dizzy. This can increase your chance of falling. Ask your doctor what other things that you can do to help prevent falls. This information is not intended to replace advice given to you by your health care provider. Make sure you discuss any questions you have with your health care provider. Document Released: 10/20/2008 Document Revised: 06/01/2015 Document Reviewed: 01/28/2014 Elsevier Interactive Patient Education  2017 [employer].

## 2019-07-28 NOTE — Progress Notes (Signed)
This visit occurred during the SARS-CoV-2 public health emergency.  Safety protocols were in place, including screening questions prior to the visit, additional usage of staff PPE, and extensive cleaning of exam room while observing appropriate contact time as indicated for disinfecting solutions.  Subjective:   Kelly Lambert is a 69 y.o. male who presents for Medicare Annual/Subsequent preventive examination.  Review of Systems     Cardiac Risk Factors include: advanced age (>59mn, >>61women);diabetes mellitus;hypertension;male gender;obesity (BMI >30kg/m2);sedentary lifestyle     Objective:    Today's Vitals   07/28/19 1445  BP: 126/84  Pulse: 72  Temp: 98 F (36.7 C)  TempSrc: Oral  Weight: 211 lb (95.7 kg)  Height: 5' 9.4" (1.763 m)   Body mass index is 30.8 kg/m.  Advanced Directives 07/28/2019 05/21/2018 04/07/2018  Does Patient Have a Medical Advance Directive? No No No  Would patient like information on creating a medical advance directive? - - No - Patient declined    Current Medications (verified) Outpatient Encounter Medications as of 07/28/2019  Medication Sig  . amLODipine (NORVASC) 2.5 MG tablet Take 1 tablet (2.5 mg total) by mouth daily. Take 1 tablet at night for high blood pressure  . aspirin EC 81 MG tablet Take 81 mg by mouth daily.  . Blood Glucose Monitoring Suppl (ACCU-CHEK GUIDE) w/Device KIT 1 kit by Does not apply route 3 (three) times daily.  . dapagliflozin propanediol (FARXIGA) 10 MG TABS tablet Take 10 mg by mouth daily before breakfast. (Patient not taking: Reported on 07/27/2019)  . fluticasone (FLONASE) 50 MCG/ACT nasal spray Place 1 spray into both nostrils daily.  .Marland Kitchengabapentin (NEURONTIN) 100 MG capsule Take 1 capsule (100 mg total) by mouth at bedtime.  .Marland Kitchenglucose blood (ACCU-CHEK GUIDE) test strip Use to check blood sugar 3 times a day. Dx code E11.65  . Lancets (ACCU-CHEK MULTICLIX) lancets Use to check blood sugars 3 times a day. Dx  code: e11.65  . Multiple Vitamin (MULTIVITAMIN) tablet Take 1 tablet by mouth daily.  . pantoprazole (PROTONIX) 40 MG tablet Take 1 tablet (40 mg total) by mouth daily.  . pravastatin (PRAVACHOL) 40 MG tablet Take 1 tablet (40 mg total) by mouth daily.  . tamsulosin (FLOMAX) 0.4 MG CAPS capsule Take 1 capsule by mouth daily (Patient taking differently: Take 0.4 mg by mouth 2 (two) times daily. )  . valsartan (DIOVAN) 160 MG tablet Take 1 tablet (160 mg total) by mouth daily.   No facility-administered encounter medications on file as of 07/28/2019.    Allergies (verified) Patient has no known allergies.   History: Past Medical History:  Diagnosis Date  . BPH (benign prostatic hyperplasia)   . Diabetes mellitus without complication ([facility]   . Hyperlipidemia associated with type 2 diabetes mellitus ([facility]   . Hypertension    Past Surgical History:  Procedure Laterality Date  . JOINT REPLACEMENT Bilateral 2011   knee   Family History  Problem Relation Age of Onset  . Cancer Mother   . Cancer Father   . Cancer Brother    Social History   Socioeconomic History  . Marital status: Married    Spouse name: Not on file  . Number of children: 3  . Years of education: Not on file  . Highest education level: Not on file  Occupational History  . Occupation: part time work  Tobacco Use  . Smoking status: Former Smoker    Packs/day: 0.50    Years: 30.00    Pack years:  15.00    Types: Cigarettes    Quit date: 1999    Years since quitting: 22.5  . Smokeless tobacco: Former Systems developer    Types: Sarina Ser    Quit date: 2018  Vaping Use  . Vaping Use: Never used  Substance and Sexual Activity  . Alcohol use: Not Currently  . Drug use: Not Currently  . Sexual activity: Yes  Other Topics Concern  . Not on file  Social History Narrative   Patient reports he works 7 days per week; currently out of work at his M-F job at [employer] due to [employer] pandemic. Patient reports he is still receiving  income and works as a IT trainer. Patient participates in daily exercise walking 7-8 miles per day.   Social Determinants of Health   Financial Resource Strain: Low Risk   . Difficulty of Paying Living Expenses: Not hard at all  Food Insecurity: No Food Insecurity  . Worried About Charity fundraiser in the Last Year: Never true  . Ran Out of Food in the Last Year: Never true  Transportation Needs: No Transportation Needs  . Lack of Transportation (Medical): No  . Lack of Transportation (Non-Medical): No  Physical Activity: Inactive  . Days of Exercise per Week: 0 days  . Minutes of Exercise per Session: 0 min  Stress: No Stress Concern Present  . Feeling of Stress : Not at all  Social Connections:   . Frequency of Communication with Friends and Family:   . Frequency of Social Gatherings with Friends and Family:   . Attends Religious Services:   . Active Member of Clubs or Organizations:   . Attends Archivist Meetings:   Marland Kitchen Marital Status:     Tobacco Counseling Counseling given: Not Answered   Clinical Intake:  Pre-visit preparation completed: Yes  Pain : No/denies pain     Nutritional Status: BMI > 30  Obese Nutritional Risks: None Diabetes: Yes  How often do you need to have someone help you when you read instructions, pamphlets, or other written materials from your doctor or pharmacy?: 1 - Never What is the last grade level you completed in school?: 12th grade  Diabetic? Yes Nutrition Risk Assessment:  Has the patient had any N/V/D within the last 2 months?  No  Does the patient have any non-healing wounds?  No  Has the patient had any unintentional weight loss or weight gain?  No   Diabetes:  Is the patient diabetic?  Yes  If diabetic, was a CBG obtained today?  No  Did the patient bring in their glucometer from home?  No  How often do you monitor your CBG's? Twice daily.   Financial Strains and Diabetes Management:  Are you  having any financial strains with the device, your supplies or your medication? No .  Does the patient want to be seen by Chronic Care Management for management of their diabetes?  No  Would the patient like to be referred to a Nutritionist or for Diabetic Management?  No   Diabetic Exams:  Diabetic Eye Exam: Completed 10/09/2018 Diabetic Foot Exam: Completed today   Interpreter Needed?: No  Information entered by :: NAllen LPN   Activities of Daily Living In your present state of health, do you have any difficulty performing the following activities: 07/28/2019 07/27/2019  Hearing? N N  Vision? N N  Difficulty concentrating or making decisions? N Y  Walking or climbing stairs? N N  Dressing or  bathing? N N  Doing errands, shopping? N N  Preparing Food and eating ? N -  Using the Toilet? N -  In the past six months, have you accidently leaked urine? N -  Do you have problems with loss of bowel control? N -  Managing your Medications? N -  Managing your Finances? N -  Housekeeping or managing your Housekeeping? N -  Some recent data might be hidden    Patient Care Team: [name], MD as PCP - General (Internal Medicine) Daneen Schick as Social Worker [place], Claudette Stapler, RN as Case Manager [place], Kennieth Francois, [facility] (Pharmacist)  Indicate any recent Medical Services you may have received from other than [facility] providers in the past year (date may be approximate).     Assessment:   This is a routine wellness examination for Kalyb.  Hearing/Vision screen  Hearing Screening   125Hz  250Hz  500Hz  1000Hz  2000Hz  3000Hz  4000Hz  6000Hz  8000Hz   Right ear:           Left ear:           Vision Screening Comments: Regular eye exams, does not remember name  Dietary issues and exercise activities discussed: Current Exercise Habits: The patient does not participate in regular exercise at present  Goals    .  "I would like to review nutrtional information to help with meal planning"  (pt-stated)      Current Barriers:  Marland Kitchen Knowledge Deficits related to disease process and Self Health management for Diabetes   Nurse Case Manager Clinical Goal(s):  Marland Kitchen Over the next 30 days, patient will work with Consulting civil engineer to address Diabetes disease process, Meal Planning and Knowing What to do for Hypo/Hyperglycemic events. Goal Met . New - 11/25/18 - Over the next 30 days, patient will collaborate the the embedded Pharm D Lottie Dawson for comprehensive medication review and to evaluate current diabetic pharmacy regimen Goal Met . New - 11/25/18 - Over the next 90 days, patient will have improved daily glycemic control (80-130) and will have lowered his A1C <7.9  CCM RN CM Interventions:  Completed 12/25/18 with patient   . Evaluation of current treatment plan related to Diabetes and patient's adherence to plan as established by provider. . Provided education to patient re: recent A1C has increased to 7.9 obtained on 11/11/18; discussed this is a jump from 6.9 from 1 year ago; discussed target A1C; Reinforced following ADA recommendations diet, following a low carb diabetic friendly diet; patient admits to eating hot dogs and drinking soda; discussed mailing patient resources for review of Meal planning using the plate method with portion control and low Carb food choices; discussed recommendations for implementing daily exercise (150 mins per week)  . Reviewed medications with patient and discussed patient is adhering to taking his Metformin bid as directed w/o missed doses; he has spoken with embedded Pharm D Lottie Dawson and may consider starting a GLP 1 in the future . Discussed plans with patient for ongoing care management follow up and provided patient with direct contact information for care management team . Advised patient, providing education and rationale, to check cbg daily before meals and record, calling the PCP and or CCM team for findings outside established parameters.  <80  and or >200  Patient Self Care Activities:  . Self administers medications as prescribed . Attends all scheduled provider appointments . Calls pharmacy for medication refills . Performs ADL's independently . Performs IADL's independently . Calls provider office for new concerns  or questions  Please see past updates related to this goal by clicking on the "Past Updates" button in the selected goal       .  "to keep my BP well controlled" (pt-stated)      Current Barriers:  Marland Kitchen Knowledge Deficits related to Hypertension  . Chronic Disease Management support and education needs related to Hypertension and DMII  Nurse Case Manager Clinical Goal(s):  Marland Kitchen Over the next 90 days, patient will verbalize basic understanding of Hypertension disease process and self health management plan as evidenced by patient will be able to verbalize the parameters for his target BP and will be able to Self check his BP at home and routinely  CCM RN CM Interventions:  12/25/18 completed call with patient   . Evaluation of current treatment plan related to Hypertension and patient's adherence to plan as established by provider. . Provided education to patient re: target BP per AHA and ADA recommendations; 130/80 or lower and unless otherwise directed by MD . Reviewed medications with patient and discussed patient is adhering to taking his antihypertensive medication exactly as prescribed and w/o missed doses . Discussed plans with patient for ongoing care management follow up and provided patient with direct contact information for care management team . Advised patient, providing education and rationale, to monitor blood pressure daily and record, calling the CCM team and or PCP for findings outside established parameters.  . Provided patient with printed educational materials related to What Is High Blood Pressure; Why Should I restrict Sodium; African-Americans and High Blood Pressure; Life's Simple 7  Patient  Self Care Activities:  . Self administers medications as prescribed . Attends all scheduled provider appointments . Calls pharmacy for medication refills . Calls provider office for new concerns or questions  Initial goal documentation     .  I would like to continue optimize my diabetes management (pt-stated)      Current Barriers:  . Diabetes: N0UV; complicated by chronic medical conditions including CKD, HTN, most recent A1c 7.9% (was 7% on 07/02/18) . Current antihyperglycemic regimen: metformin 1G BID o Patient wanted to discuss other therapies for diabetes management o Discussed potential transition to GLP1 therapy (SQ vs. PO) o Patient voiced interest in GLP1 therapy and will take some time to research and decide before next PCP appt . Denies hypoglycemic symptoms; denies hyperglycemic symptoms . Current meal patterns: discusses plate method and reducing carbohydrate intake; avoiding sugary drinks . Current exercise: n/a-states winter temps make exercise challenging . Current blood glucose readings: FBG 110-130 in the AM before eating . Cardiovascular risk reduction: o Current hypertensive regimen: valsartan/HCTZ o Current hyperlipidemia regimen: pravastatin o Current antiplatelet regimen: n/a  Pharmacist Clinical Goal(s):  Marland Kitchen Over the next 90 days, patient will work with PharmD and primary care provider to address needs related to optimization of diabetes management  Interventions: . Comprehensive medication review performed, medication list updated in electronic medical record . Reviewed & discussed the following diabetes-related information with patient: o Follow ADA recommended "diabetes-friendly" diet  (reviewed healthy snack/food options) o Reviewed medication purpose/side effects-->patient denies adverse events  Patient Self Care Activities:  . Patient will check blood glucose 3x weekly , document, and provide at future appointments . Patient will focus on medication  adherence by continuing to take medication as prescribed . Patient will take medications as prescribed . Patient will contact provider with any episodes of hypoglycemia . Patient will report any questions or concerns to provider   Initial goal documentation     .  Patient Stated (pt-stated)      Would like to get off blood pressure medicine and metformin    .  Patient Stated      07/28/2019, wants to eat less starches to get blood sugars under control    .  Pharmacy Care Plan      CARE PLAN ENTRY  Current Barriers:  . Chronic Disease Management support, education, and care coordination needs related to Hypertension, Hyperlipidemia, and Diabetes   Hypertension . Pharmacist Clinical Goal(s): o Over the next 90 days, patient will work with PharmD and providers to maintain BP goal <130/80 . Current regimen:  o Amlodipine 2.1m daily o Valsartan 1668mdaily . Interventions: o Dietary and exercise recommendations provided - Limit salt intake - Increase physical activity to 30 minutes 3 times weekly, with a long term goal of 30 minutes 5 times weekly . Patient self care activities - Over the next 90 days, patient will: o Check BP twice weekly and if symptomatic, document, and provide at future appointments o Ensure daily salt intake < 2300 mg/day  Hyperlipidemia . Pharmacist Clinical Goal(s): o Over the next 90 days, patient will work with PharmD and providers to achieve LDL goal < 70 . Current regimen:  o Pravastatin 4099maily . Interventions: o Dietary and exercise recommendations provided - Increase intake of healthy fats (i.e avocados, walnuts, flaxseed) - Decrease intake of foods high in saturated and trans fat (i.e. chips) . Patient self care activities - Over the next 90 days, patient will: o Work to improve diet, increase heart healthy fats and decrease trans/saturated fats  Diabetes . Pharmacist Clinical Goal(s): o Over the next 90 days, patient will work with PharmD  and providers to achieve A1c goal <7% . Current regimen:  o Metformin 1000m73mice daily . Interventions: o Will discuss restarting Farxiga 5mg 77mly with PCP. Patient states that he cannot tolerate Farxiga 10mg 69muse it make him urinate too frequently . Patient self care activities - Over the next 90 days, patient will: o Check blood sugar once daily, document, and provide at future appointments o Contact provider with any episodes of hypoglycemia  GERD . Pharmacist Clinical Goal(s) o Over the next 90 days, patient will work with PharmD and providers to manage GERD symptoms . Current regimen:  o Pantoprazole 40mg d66m . Interventions: o Patient expressed desire to take less medications. He has not been experiencing symptoms of acid reflux. Recommend patient stop pantoprazole to determine if he still needs it o Recommend avoiding foods that may trigger heartburn . Patient self care activities - Over the next 90 days, patient will: o Stop pantoprazole o Avoid foods that cause heartburn  Medication management . Pharmacist Clinical Goal(s): o Over the next 90 days, patient will work with PharmD and providers to achieve optimal medication adherence . [facility] . Interventions o Comprehensive medication review performed. o Continue current medication management strategy . Patient self care activities - Over the next 90 days, patient will: o Focus on medication adherence by using a pill box to organize medications o Take medications as prescribed o Report any questions or concerns to PharmD and/or provider(s)  Initial goal documentation       Depression Screen PHQ 2/9 Scores 07/28/2019 07/27/2019 05/21/2018 04/07/2018 03/16/2018 11/24/2017  PHQ - 2 Score 0 0 0 0 0 0  PHQ- 9 Score 1 - 0 - - -    Fall Risk Fall Risk  07/28/2019 07/27/2019 05/21/2018 03/16/2018 11/24/2017  Falls in  the past year? 0 0 1 1 0  Number falls in past yr: - - 0 1 -  Comment - - tripped  over dog's toy - -  Injury with Fall? - - 0 1 -  Risk for fall due to : Medication side effect - History of fall(s);Medication side effect - -  Follow up Falls evaluation completed;Education provided;Falls prevention discussed - Falls prevention discussed - -    Any stairs in or around the home? No  If so, are there any without handrails? n/a Home free of loose throw rugs in walkways, pet beds, electrical cords, etc? Yes  Adequate lighting in your home to reduce risk of falls? Yes   ASSISTIVE DEVICES UTILIZED TO PREVENT FALLS:  Life alert? No  Use of a cane, walker or w/c? No  Grab bars in the bathroom? No  Shower chair or bench in shower? No  Elevated toilet seat or a handicapped toilet? No   TIMED UP AND GO:  Was the test performed? No . .   Gait steady and fast without use of assistive device  Cognitive Function:     6CIT Screen 07/28/2019 05/21/2018  What Year? 0 points 0 points  What month? 0 points 0 points  What time? 0 points 0 points  Count back from 20 0 points 0 points  Months in reverse 0 points 0 points  Repeat phrase 2 points 0 points  Total Score 2 0    Immunizations Immunization History  Administered Date(s) Administered  . Fluad Quad(high Dose 65+) 09/18/2018  . Influenza-Unspecified 09/18/2018, 09/30/2018  . PFIZER SARS-COV-2 Vaccination 02/13/2019, 03/06/2019    TDAP status: Due, Education has been provided regarding the importance of this vaccine. Advised may receive this vaccine at local pharmacy or Health Dept. Aware to provide a copy of the vaccination record if obtained from local pharmacy or Health Dept. Verbalized acceptance and understanding. Flu Vaccine status: Up to date Pneumococcal vaccine status: Due Covid-19 vaccine status: Completed vaccines  Qualifies for Shingles Vaccine? Yes   Zostavax completed No   Shingrix Completed?: No.    Education has been provided regarding the importance of this vaccine. Patient has been advised to call  insurance company to determine out of pocket expense if they have not yet received this vaccine. Advised may also receive vaccine at local pharmacy or Health Dept. Verbalized acceptance and understanding.  Screening Tests Health Maintenance  Topic Date Due  . TETANUS/TDAP  Never done  . PNA vac Low Risk Adult (1 of 2 - PCV13) Never done  . INFLUENZA VACCINE  08/08/2019  . OPHTHALMOLOGY EXAM  10/09/2019  . HEMOGLOBIN A1C  11/04/2019  . FOOT EXAM  07/27/2020  . COLONOSCOPY  03/18/2023  . COVID-19 Vaccine  Completed  . Hepatitis C Screening  Completed    Health Maintenance  Health Maintenance Due  Topic Date Due  . TETANUS/TDAP  Never done  . PNA vac Low Risk Adult (1 of 2 - PCV13) Never done    Colorectal cancer screening: Completed 03/18/2019. Repeat every 4 years  Lung Cancer Screening: (Low Dose CT Chest recommended if Age 76-80 years, 30 pack-year currently smoking OR have quit w/in 15years.) does not qualify.   Lung Cancer Screening Referral: no  Additional Screening:  Hepatitis C Screening: does qualify; Completed 03/16/2018  Vision Screening: Recommended annual ophthalmology exams for early detection of glaucoma and other disorders of the eye. Is the patient up to date with their annual eye exam?  Yes  Who  is the provider or what is the name of the office in which the patient attends annual eye exams? Does not remember If pt is not established with a provider, would they like to be referred to a provider to establish care? No .   Dental Screening: Recommended annual dental exams for proper oral hygiene  Community Resource Referral / Chronic Care Management: CRR required this visit?  No   CCM required this visit?  No      Plan:     I have personally reviewed and noted the following in the patient's chart:   . Medical and social history . Use of alcohol, tobacco or illicit drugs  . Current medications and supplements . Functional ability and  status . Nutritional status . Physical activity . Advanced directives . List of other physicians . Hospitalizations, surgeries, and ER visits in previous 12 months . Vitals . Screenings to include cognitive, depression, and falls . Referrals and appointments  In addition, I have reviewed and discussed with patient certain preventive protocols, quality metrics, and best practice recommendations. A written personalized care plan for preventive services as well as general preventive health recommendations were provided to patient.     Kellie Simmering, LPN   [id number]   Nurse Notes:

## 2019-07-28 NOTE — Progress Notes (Signed)
I,Katawbba Wiggins,acting as a Education administrator for Maximino Greenland, MD.,have documented all relevant documentation on the behalf of Maximino Greenland, MD,as directed by  Maximino Greenland, MD while in the presence of Maximino Greenland, MD.  This visit occurred during the SARS-CoV-2 public health emergency.  Safety protocols were in place, including screening questions prior to the visit, additional usage of staff PPE, and extensive cleaning of exam room while observing appropriate contact time as indicated for disinfecting solutions.  Subjective:     Patient ID: Kelly Lambert , male    DOB: 12-22-1950 , 69 y.o.   MRN: [id number]   Chief Complaint  Patient presents with  . Annual Exam  . Diabetes  . Hypertension    HPI  He is here today for a full physical examination. He is followed by Urology for prostate exams. He has no specific concerns or complaints at this time.   Diabetes He presents for his follow-up diabetic visit. He has type 2 diabetes mellitus. There are no hypoglycemic associated symptoms. Pertinent negatives for diabetes include no blurred vision and no chest pain. There are no hypoglycemic complications. Diabetic complications include nephropathy. Risk factors for coronary artery disease include diabetes mellitus, dyslipidemia, hypertension and male sex. He is compliant with treatment most of the time. He is following a diabetic diet. Meal planning includes avoidance of concentrated sweets. He participates in exercise intermittently. His home blood glucose trend is fluctuating minimally. His breakfast blood glucose is taken between 9-10 am. His breakfast blood glucose range is generally 110-130 mg/dl. An ACE inhibitor/angiotensin II receptor blocker is being taken. Eye exam is current.  Hypertension This is a chronic problem. The current episode started more than 1 year ago. The problem has been gradually improving since onset. The problem is controlled. Associated symptoms include neck  pain. Pertinent negatives include no blurred vision, chest pain, palpitations or shortness of breath. Risk factors for coronary artery disease include diabetes mellitus, dyslipidemia and male gender. Past treatments include angiotensin blockers and diuretics. The current treatment provides moderate improvement. Compliance problems include exercise.      Past Medical History:  Diagnosis Date  . BPH (benign prostatic hyperplasia)   . Diabetes mellitus without complication ([place])   . Hyperlipidemia associated with type 2 diabetes mellitus ([place])   . Hypertension      Family History  Problem Relation Age of Onset  . Cancer Mother   . Cancer Father   . Cancer Brother      Current Outpatient Medications:  .  aspirin EC 81 MG tablet, Take 81 mg by mouth daily., Disp: , Rfl:  .  Blood Glucose Monitoring Suppl (ACCU-CHEK GUIDE) w/Device KIT, 1 kit by Does not apply route 3 (three) times daily., Disp: 1 kit, Rfl: 3 .  fluticasone (FLONASE) 50 MCG/ACT nasal spray, Place 1 spray into both nostrils daily., Disp: 16 g, Rfl: 2 .  glucose blood (ACCU-CHEK GUIDE) test strip, Use to check blood sugar 3 times a day. Dx code E11.65, Disp: 300 each, Rfl: 2 .  Lancets (ACCU-CHEK MULTICLIX) lancets, Use to check blood sugars 3 times a day. Dx code: e11.65, Disp: 300 each, Rfl: 2 .  Multiple Vitamin (MULTIVITAMIN) tablet, Take 1 tablet by mouth daily., Disp: , Rfl:  .  pantoprazole (PROTONIX) 40 MG tablet, Take 1 tablet (40 mg total) by mouth daily., Disp: 90 tablet, Rfl: 1 .  tamsulosin (FLOMAX) 0.4 MG CAPS capsule, Take 1 capsule by mouth daily (Patient taking differently: Take 0.4 mg  by mouth 2 (two) times daily. ), Disp: 90 capsule, Rfl: 1 .  amLODipine (NORVASC) 2.5 MG tablet, Take 1 tablet (2.5 mg total) by mouth daily. Take 1 tablet at night for high blood pressure, Disp: 90 tablet, Rfl: 2 .  dapagliflozin propanediol (FARXIGA) 10 MG TABS tablet, Take 10 mg by mouth daily before breakfast. (Patient not  taking: Reported on 07/27/2019), Disp: 30 tablet, Rfl: 1 .  gabapentin (NEURONTIN) 100 MG capsule, Take 1 capsule (100 mg total) by mouth at bedtime., Disp: 90 capsule, Rfl: 2 .  pravastatin (PRAVACHOL) 40 MG tablet, Take 1 tablet (40 mg total) by mouth daily., Disp: 90 tablet, Rfl: 2 .  valsartan (DIOVAN) 160 MG tablet, Take 1 tablet (160 mg total) by mouth daily., Disp: 90 tablet, Rfl: 2 .  Vitamin D, Ergocalciferol, (DRISDOL) 1.25 MG (50000 UNIT) CAPS capsule, Take one capsule po twice weekly on Tuesdays/Fridays, Disp: 24 capsule, Rfl: 0   No Known Allergies   Men's preventive visit. Patient Health Questionnaire (PHQ-2) is    Office Visit from 07/28/2019 in [facility]  PHQ-2 Total Score 0    . Patient is on a diabetes diet. Marital status: Married. Relevant history for alcohol use is:  Social History   Substance and Sexual Activity  Alcohol Use Not Currently  . Relevant history for tobacco use is:  Social History   Tobacco Use  Smoking Status Former Smoker  . Packs/day: 0.50  . Years: 30.00  . Pack years: 15.00  . Types: Cigarettes  . Quit date: 1999  . Years since quitting: 22.5  Smokeless Tobacco Former Systems developer  . Types: Chew  . Quit date: 2018  .   Review of Systems  Constitutional: Negative.   HENT: Negative.   Eyes: Negative.  Negative for blurred vision.  Respiratory: Negative.  Negative for shortness of breath.   Cardiovascular: Negative.  Negative for chest pain and palpitations.  Gastrointestinal: Negative.   Endocrine: Negative.   Genitourinary: Negative.   Musculoskeletal: Positive for arthralgias and neck pain.       He c/o b/l ankle pain. During mod-severe flares, ambulation is difficult. He wants to have temporary handicapped placard.   Skin: Negative.   Allergic/Immunologic: Negative.   Neurological: Negative.   Hematological: Negative.   Psychiatric/Behavioral: Negative.      Today's Vitals   07/28/19 1405  BP: 126/84  Pulse:  72  Temp: 98 F (36.7 C)  TempSrc: Oral  Weight: 211 lb 12.8 oz (96.1 kg)  Height: 5' 9.4" (1.763 m)  PainSc: 0-No pain   Body mass index is 30.92 kg/m.  Wt Readings from Last 3 Encounters:  07/28/19 211 lb (95.7 kg)  07/28/19 211 lb 12.8 oz (96.1 kg)  05/05/19 212 lb 6.4 oz (96.3 kg)   Objective:  Physical Exam Vitals and nursing note reviewed.  Constitutional:      Appearance: Normal appearance.  HENT:     Head: Normocephalic and atraumatic.     Right Ear: Tympanic membrane, ear canal and external ear normal.     Left Ear: Tympanic membrane, ear canal and external ear normal.     Nose: Nose normal.     Mouth/Throat:     Mouth: Mucous membranes are moist.     Pharynx: Oropharynx is clear.  Eyes:     Extraocular Movements: Extraocular movements intact.     Conjunctiva/sclera: Conjunctivae normal.     Pupils: Pupils are equal, round, and reactive to light.  Neck:  Trachea: Trachea normal.     Comments: There is some tenderness to deep palpation.  Cardiovascular:     Rate and Rhythm: Normal rate and regular rhythm.     Pulses: Normal pulses.          Dorsalis pedis pulses are 2+ on the right side and 2+ on the left side.     Heart sounds: Normal heart sounds.  Pulmonary:     Effort: Pulmonary effort is normal.     Breath sounds: Normal breath sounds.  Chest:     Breasts:        Right: Normal. No swelling, bleeding, inverted nipple, mass or nipple discharge.        Left: Normal. No swelling, bleeding, inverted nipple, mass or nipple discharge.  Abdominal:     General: Abdomen is flat. Bowel sounds are normal.     Palpations: Abdomen is soft.  Genitourinary:    Comments: deferred Musculoskeletal:        General: Normal range of motion.     Cervical back: Normal range of motion and neck supple. Normal range of motion.  Feet:     Right foot:     Protective Sensation: 5 sites tested. 5 sites sensed.     Skin integrity: Callus and dry skin present.     Toenail  Condition: Right toenails are abnormally thick.     Left foot:     Protective Sensation: 5 sites tested. 5 sites sensed.     Skin integrity: Callus and dry skin present.     Toenail Condition: Left toenails are abnormally thick.  Skin:    General: Skin is warm.  Neurological:     General: No focal deficit present.     Mental Status: He is alert.  Psychiatric:        Mood and Affect: Mood normal.        Behavior: Behavior normal.         Assessment And Plan:     1. Routine general medical examination at a health care facility  Comments: A full exam was performed. DRE deferred, he is followed by Urology. Colonoscopy up to date. PATIENT IS ADVISED TO GET 30-45 MINUTES REGULAR EXERCISE NO LESS THAN FOUR TO FIVE DAYS PER WEEK - BOTH WEIGHTBEARING EXERCISES AND AEROBIC ARE RECOMMENDED.  PATIENT IS ADVISED TO FOLLOW A HEALTHY DIET WITH AT LEAST SIX FRUITS/VEGGIES PER DAY, DECREASE INTAKE OF RED MEAT, AND TO INCREASE FISH INTAKE TO TWO DAYS PER WEEK.  MEATS/FISH SHOULD NOT BE FRIED, BAKED OR BROILED IS PREFERABLE.  I SUGGEST WEARING SPF 50 SUNSCREEN ON EXPOSED PARTS AND ESPECIALLY WHEN IN THE DIRECT SUNLIGHT FOR AN EXTENDED PERIOD OF TIME.  PLEASE AVOID FAST FOOD RESTAURANTS AND INCREASE YOUR WATER INTAKE.   2. Type 2 diabetes mellitus with stage 2 chronic kidney disease, without long-term current use of insulin ([facility])  Comments: Diabetic foot exam performed. I DISCUSSED WITH THE PATIENT AT LENGTH REGARDING THE GOALS OF GLYCEMIC CONTROL AND POSSIBLE LONG-TERM COMPLICATIONS.  I  ALSO STRESSED THE IMPORTANCE OF COMPLIANCE WITH HOME GLUCOSE MONITORING, DIETARY RESTRICTIONS INCLUDING AVOIDANCE OF SUGARY DRINKS/PROCESSED FOODS,  ALONG WITH REGULAR EXERCISE.  I  ALSO STRESSED THE IMPORTANCE OF ANNUAL EYE EXAMS, SELF FOOT CARE AND COMPLIANCE WITH OFFICE VISITS.  - CBC - Hemoglobin A1c - POCT Urinalysis Dipstick (81002) - POCT UA - Microalbumin - BMP8+EGFR  3. Hypertensive nephropathy  Comments:  Chronic, well controlled. He will continue with current meds. He is encouraged to avoid adding  salt to his foods. EKG performed, NSR w/o acute changes. He will rto in six months for re-evaluation.   - EKG 12-Lead  4. Cervicalgia  Comments: We discussed the importance of good posture. He will continue to work on this. He is encouraged to perform stretching exercises as well. Advised to apply Vicks Vaporub to affected area twice daily as needed. He will let me know if his sx persist.   5. Vitamin D deficiency disease  I WILL CHECK A VIT D LEVEL AND SUPPLEMENT AS NEEDED.  ALSO ENCOURAGED TO SPEND 15 MINUTES IN THE SUN DAILY.  - VITAMIN D 25 Hydroxy (Vit-D Deficiency, Fractures)  6. Chronic pain of both ankles  Comments: Handicapped placard form completed during his visit. He is also encouraged to increase his daily activity as tolerated. Sx likely due to osteoarthritis.   7. Immunization due  Rx Boostrix (Tdap) was sent to his local pharmacy. He is encouraged to get within next two weeks.   8. Class 1 obesity due to excess calories with serious comorbidity and body mass index (BMI) of 30.0 to 30.9 in adult  BMI is acceptable for his demographic. Encouraged to aim for at least 150 minutes of exercise per week.    Patient was given opportunity to ask questions. Patient verbalized understanding of the plan and was able to repeat key elements of the plan. All questions were answered to their satisfaction.   Maximino Greenland, MD   I, Maximino Greenland, MD, have reviewed all documentation for this visit. The documentation on 08/01/19 for the exam, diagnosis, procedures, and orders are all accurate and complete.  THE PATIENT IS ENCOURAGED TO PRACTICE SOCIAL DISTANCING DUE TO THE COVID-19 PANDEMIC.

## 2019-07-29 LAB — CBC
Hematocrit: 41 % (ref 37.5–51.0)
Hemoglobin: 13.2 g/dL (ref 13.0–17.7)
MCH: 28.4 pg (ref 26.6–33.0)
MCHC: 32.2 g/dL (ref 31.5–35.7)
MCV: 88 fL (ref 79–97)
Platelets: 198 10*3/uL (ref 150–450)
RBC: 4.65 x10E6/uL (ref 4.14–5.80)
RDW: 15.6 % — ABNORMAL HIGH (ref 11.6–15.4)
WBC: 4.3 10*3/uL (ref 3.4–10.8)

## 2019-07-29 LAB — BMP8+EGFR
BUN/Creatinine Ratio: 11 (ref 10–24)
BUN: 10 mg/dL (ref 8–27)
CO2: 20 mmol/L (ref 20–29)
Calcium: 9.7 mg/dL (ref 8.6–10.2)
Chloride: 105 mmol/L (ref 96–106)
Creatinine, Ser: 0.92 mg/dL (ref 0.76–1.27)
GFR calc Af Amer: 98 mL/min/{1.73_m2} (ref >59)
GFR calc non Af Amer: 85 mL/min/{1.73_m2} (ref >59)
Glucose: 121 mg/dL — ABNORMAL HIGH (ref 65–99)
Potassium: 4.3 mmol/L (ref 3.5–5.2)
Sodium: 143 mmol/L (ref 134–144)

## 2019-07-29 LAB — HEMOGLOBIN A1C
Est. average glucose Bld gHb Est-mCnc: 157 mg/dL
Hgb A1c MFr Bld: 7.1 % — ABNORMAL HIGH (ref 4.8–5.6)

## 2019-07-30 LAB — VITAMIN D 25 HYDROXY (VIT D DEFICIENCY, FRACTURES): Vit D, 25-Hydroxy: 23.9 ng/mL — ABNORMAL LOW (ref 30.0–100.0)

## 2019-07-30 LAB — SPECIMEN STATUS REPORT

## 2019-07-31 ENCOUNTER — Other Ambulatory Visit: Payer: Self-pay | Admitting: Internal Medicine

## 2019-07-31 MED ORDER — VITAMIN D (ERGOCALCIFEROL) 1.25 MG (50000 UNIT) PO CAPS
ORAL_CAPSULE | ORAL | 0 refills | Status: DC
Start: 2019-07-31 — End: 2020-02-02

## 2019-08-02 ENCOUNTER — Other Ambulatory Visit: Payer: Self-pay

## 2019-08-02 MED ORDER — METFORMIN HCL 1000 MG PO TABS: 1000.0000 mg | ORAL_TABLET | Freq: Two times a day (BID) | ORAL | 2 refills | Status: DC

## 2019-08-11 ENCOUNTER — Telehealth: Payer: Self-pay

## 2019-08-23 DIAGNOSIS — R350 Frequency of micturition: Secondary | ICD-10-CM | POA: Diagnosis not present

## 2019-08-23 DIAGNOSIS — N401 Enlarged prostate with lower urinary tract symptoms: Secondary | ICD-10-CM | POA: Diagnosis not present

## 2019-08-23 DIAGNOSIS — R972 Elevated prostate specific antigen [PSA]: Secondary | ICD-10-CM | POA: Diagnosis not present

## 2019-08-23 DIAGNOSIS — N5201 Erectile dysfunction due to arterial insufficiency: Secondary | ICD-10-CM | POA: Diagnosis not present

## 2019-09-02 ENCOUNTER — Telehealth: Payer: Self-pay

## 2019-09-02 ENCOUNTER — Ambulatory Visit (INDEPENDENT_AMBULATORY_CARE_PROVIDER_SITE_OTHER): Payer: Medicare HMO

## 2019-09-02 ENCOUNTER — Other Ambulatory Visit: Payer: Self-pay

## 2019-09-02 DIAGNOSIS — N182 Chronic kidney disease, stage 2 (mild): Secondary | ICD-10-CM | POA: Diagnosis not present

## 2019-09-02 DIAGNOSIS — E7800 Pure hypercholesterolemia, unspecified: Secondary | ICD-10-CM | POA: Diagnosis not present

## 2019-09-02 DIAGNOSIS — E1122 Type 2 diabetes mellitus with diabetic chronic kidney disease: Secondary | ICD-10-CM | POA: Diagnosis not present

## 2019-09-02 DIAGNOSIS — I129 Hypertensive chronic kidney disease with stage 1 through stage 4 chronic kidney disease, or unspecified chronic kidney disease: Secondary | ICD-10-CM

## 2019-09-02 NOTE — Chronic Care Management (AMB) (Signed)
Chronic Care Management Pharmacy  Name: Kelly Lambert  MRN: [id number] DOB: Oct 17, 1950  Chief Complaint/ HPI  [place],  68 y.o. , male presents for their Follow-Up CCM visit with the clinical pharmacist via telephone due to COVID-19 Pandemic. Patient complained of headaches recently that he feels like are related to his sinuses (hurt at the back of his neck and around his eyes).  PCP : [name], MD  Their chronic conditions include: Diabetes with Stage 2 CKD, Hypertensive nephropathy, Pure hypercholesterolemia  Office Visits: 07/28/19 AWV and OV: Presented for annual exam. Complains of bilateral ankle pain. During moderate-severe flares pt has difficulty ambulating, requesting temporary handicapped placard (completed during visit). Diabetic foot exam performed. HgbA1c stable at 7.1%. HTN chronic, well controlled. Kidney function is stable. Vitamin D low (23.9), send in Rx for prescription Vitamin D. EKG showed NSR w/o acute changes. Tdap Rx sent to local pharmacy.   05/05/19 OV: Presented for diabetes check and complaining of bilateral shoulder pain (dull, aching) which started 8 months ago. Samples of Farxiga given, will give Rx if renal function stable. Labs ordered (CMP14+EGFR, HgbA1c, Lipid panel). Referred to Sports Medicine for shoulder pain. Shingles vaccine declined.   03/01/19 OV: Presented for follow up visit regarding diabetes and hypertension. Pt has not started [place]. Tolerating amlodipine 2.76m well (started at last nurse visit). Start Farxiga 544m(add to metformin), will titrate to 1035mValsartan/HCTZ changed to valsartan alone due to adding FarIranollow up in 4 weeks after starting medication. Referred to GI for colonoscopy.   02/09/19 CMA BP Check: Start amlodipine 2.5mg30mily due to BP 148/82 manual, 156/87 pt's cuff. Follow up for OV in 2 weeks.   02/04/19 Telephone: Pt left message stating he was going back to whole tablet of BP medication because he  woke up with a headache and his BP was 140/98.  02/01/19 OV: Presented for diabetes evaluation. Pt reported blood sugars have been above 150 (150-180) for several weeks. Pt denied change in diet or recent illness. Labs ordered (BMP8+EGFR, HgbA1c). Plan to start on Farxiga 5mg 35me daily after HgbA1c review. Will require insulin if A1c above 9. Advised to cut valsartan/HCTZ in half due to orthostatic hypotension. Follow up in 4 weeks.   Consult Visit: 02/18/19 OV w/ [name] BPH  CCM Encounters: 12/25/18 RN: Evaluation of current treatment regimens and chronic conditions. Patient education provided and resources suggested.   12/11/18 PharmD: Discussed GLP-1 as possible treatment option. Pt to consider and research prior to next PCP appt. Comprehensive medication review performed   Medications: Outpatient Encounter Medications as of 09/02/2019  Medication Sig  . amLODipine (NORVASC) 2.5 MG tablet Take 1 tablet (2.5 mg total) by mouth daily. Take 1 tablet at night for high blood pressure  . aspirin EC 81 MG tablet Take 81 mg by mouth daily.  . Blood Glucose Monitoring Suppl (ACCU-CHEK GUIDE) w/Device KIT 1 kit by Does not apply route 3 (three) times daily.  . gabMarland Kitchenpentin (NEURONTIN) 100 MG capsule Take 1 capsule (100 mg total) by mouth at bedtime.  . gluMarland Kitchenose blood (ACCU-CHEK GUIDE) test strip Use to check blood sugar 3 times a day. Dx code E11.65  . Lancets (ACCU-CHEK MULTICLIX) lancets Use to check blood sugars 3 times a day. Dx code: e11.65  . metFORMIN (GLUCOPHAGE) 1000 MG tablet Take 1 tablet (1,000 mg total) by mouth 2 (two) times daily with a meal.  . Multiple Vitamin (MULTIVITAMIN) tablet Take 1 tablet by mouth daily.  . pantoprazole (  PROTONIX) 40 MG tablet Take 1 tablet (40 mg total) by mouth daily.  . pravastatin (PRAVACHOL) 40 MG tablet Take 1 tablet (40 mg total) by mouth daily.  . tamsulosin (FLOMAX) 0.4 MG CAPS capsule Take 1 capsule by mouth daily (Patient taking  differently: Take 0.4 mg by mouth 2 (two) times daily. )  . valsartan (DIOVAN) 160 MG tablet Take 1 tablet (160 mg total) by mouth daily.  . Vitamin D, Ergocalciferol, (DRISDOL) 1.25 MG (50000 UNIT) CAPS capsule Take one capsule po twice weekly on Tuesdays/Fridays  . dapagliflozin propanediol (FARXIGA) 10 MG TABS tablet Take 10 mg by mouth daily before breakfast. (Patient not taking: Reported on 07/27/2019)  . fluticasone (FLONASE) 50 MCG/ACT nasal spray Place 1 spray into both nostrils daily.   No facility-administered encounter medications on file as of 09/02/2019.   Current Diagnosis/Assessment:  SDOH Interventions     Most Recent Value  SDOH Interventions  Financial Strain Interventions Intervention Not Indicated      Goals Addressed            This Visit's Progress   . Pharmacy Care Plan       CARE PLAN ENTRY  Current Barriers:  . Chronic Disease Management support, education, and care coordination needs related to Hypertension, Hyperlipidemia, and Diabetes   Hypertension . Pharmacist Clinical Goal(s): o Over the next 90 days, patient will work with PharmD and providers to maintain BP goal <130/80 . Current regimen:  o Amlodipine 2.10m daily o Valsartan 1645mdaily . Interventions: o Dietary and exercise recommendations provided - Limit salt intake - Increase physical activity to 30 minutes 3 times weekly, with a long term goal of 30 minutes 5 times weekly . Patient self care activities - Over the next 90 days, patient will: o Check BP daily to every other day and if symptomatic, document, and provide at future appointments o Ensure daily salt intake < 2300 mg/day o Try to exercise for 30 minutes daily 5 times per week  Hyperlipidemia . Pharmacist Clinical Goal(s): o Over the next 90 days, patient will work with PharmD and providers to achieve LDL goal < 70 . Current regimen:  o Pravastatin 40102maily . Interventions: o Dietary and exercise recommendations  provided - Increase intake of healthy fats (i.e avocados, walnuts, flaxseed) - Decrease intake of foods high in saturated and trans fat (i.e. chips) . Patient self care activities - Over the next 90 days, patient will: o Work to improve diet, increase heart healthy fats and decrease trans/saturated fats o Try to exercise for 30 minutes daily 5 times per week  Diabetes . Pharmacist Clinical Goal(s): o Over the next 90 days, patient will work with PharmD and providers to achieve A1c goal <7% . Current regimen:  o Metformin 1000m34mice daily . Interventions: o Advised patient to resume checking blood sugar daily and record o Provided dietary and exercise recommendations o Discussed the importance of diet and exercise to reach hemoglobin A1c goal of less than 7% o Collaborate with PCP regarding patient stopping Farxiga 10mg64matient self care activities - Over the next 90 days, patient will: o Check blood sugar once daily, document, and provide at future appointments o Contact provider with any episodes of hypoglycemia o Try to exercise for 30 minutes daily 5 times per week  GERD . Pharmacist Clinical Goal(s) o Over the next 90 days, patient will work with PharmD and providers to manage GERD symptoms . Current regimen:  o Pantoprazole 40mg 21my .  Interventions: o Patient expressed desire to take less medications. He has not been experiencing symptoms of acid reflux. o Recommend patient decreasing pantoprazole to every other day o Recommend avoiding foods that may trigger heartburn . Patient self care activities - Over the next 90 days, patient will: o Decrease pantoprazole to every other day o Avoid foods that cause heartburn  Medication management . Pharmacist Clinical Goal(s): o Over the next 90 days, patient will work with PharmD and providers to achieve optimal medication adherence . Current pharmacy: [facility] order . Interventions o Comprehensive medication review  performed. o Continue current medication management strategy . Patient self care activities - Over the next 90 days, patient will: o Focus on medication adherence by using a pill box to organize medications o Take medications as prescribed o Report any questions or concerns to PharmD and/or provider(s)  Please see past updates related to this goal by clicking on the "Past Updates" button in the selected goal         Diabetes   Recent Relevant Labs: Lab Results  Component Value Date/Time   HGBA1C 7.1 (H) 07/28/2019 02:56 PM   HGBA1C 7.1 (H) 05/05/2019 11:53 AM   MICROALBUR 30 07/28/2019 02:50 PM   MICROALBUR 10 07/02/2018 12:35 PM    Checking BG: Daily  Recent FBG Readings: 125 Recent pre-meal BG readings:  Recent 2hr PP BG readings: 145-150 Recent HS BG readings:  Patient has failed these meds in past: N/A Patient is currently uncontrolled on the following medications:   Metformin 109m twice daily  Last diabetic Foot exam: 07/28/19 Last diabetic Eye exam: 10/09/18 Lab Results  Component Value Date/Time   HMDIABEYEEXA No Retinopathy 10/09/2018 12:00 AM    We discussed:   Pt has not been checking BG since he went on vacation (came home last night)  Pt said he told PCP about stopping Farxiga at last office visit  Pt stopped taking Farxiga 18mbecause he stated it made him urinate frequently. He did not feel he had these symptoms at 50m31mose  Recommend pt check BG daily and record Exercise extensively Not working out much lately Pt says he wants to start [facility] get 30 minutes of moderate intensity exercise daily 5 times a week (150 minutes total per week) Benefits could include up to 0.7% reduction in A1c  Importance of diet and exercise to reach HgbA1c goal less than 7%  Plan Continue current medications  CMA to follow up with patient in 3-4 weeks to review BG readings Collaborate with PCP regarding pt stopping Farxiga    Hypertension   Office blood pressures are  BP Readings from Last 3 Encounters:  07/28/19 126/84  07/28/19 126/84  05/05/19 114/72   Patient has failed these meds in the past: Valsartan/HCTZ Patient is currently controlled on the following medications:   Amlodipine 2.50mg33mily  Valsartan 160mg650mly  Patient checks BP at home 1-2x per week  Patient home BP readings are ranging: 145/90, 125/80, 172/84  We discussed:  Pt states that his blood pressure has been elevated recently  Denies changes in diet or exercise  Doesn't add extra salt to food, but wife does cook with food  Recommend limiting salt when cooking food  Recommend checking blood pressure more frequently (every other day to every day and recording)  Pt states that he has not seen a correlation with his blood pressure being elevated when he has a headache  Plan Continue current medications  CMA to follow up with patient in 3-4 weeks to review BP readings  Hyperlipidemia   Lipid Panel     Component Value Date/Time   CHOL 154 05/05/2019 1153   TRIG 158 (H) 05/05/2019 1153   HDL 39 (L) 05/05/2019 1153   CHOLHDL 3.9 05/05/2019 1153   LDLCALC 87 05/05/2019 1153   LABVLDL 28 05/05/2019 1153    The 10-year ASCVD risk score Mikey Bussing DC Jr., et al., 2013) is: 30.3%   Values used to calculate the score:     Age: 63 years     Sex: Male     Is Non-Hispanic African American: Yes     Diabetic: Yes     Tobacco smoker: No     Systolic Blood Pressure: 267 mmHg     Is BP treated: Yes     HDL Cholesterol: 39 mg/dL     Total Cholesterol: 154 mg/dL   Patient has failed these meds in past: N/A Patient is currently uncontrolled on the following medications:   Pravastatin 76m daily  Plan Continue current medications  GERD   Patient has failed these meds in past: N/A Patient is currently controlled on the following medications:   Pantoprazole 422mdaily  We discussed:    Pt has continued to take  pantoprazole  Discussed that if pt is interested in decreasing pill burden, he may be able to stop this medication  Recommend pt to decrease to every other day and see how his symptoms are  Advised pt to avoid foods that trigger heartburn and indigestion  Plan Reduce pantoprazole to every other day and see how symptoms of heartburn are Follow up with patient in 3 months regarding symptoms of GERD  BPH  Managed by Urology  Patient has failed these meds in past: N/A Patient is currently controlled on the following medications:   Tamsulosin 0.87m6m capsule twice daily  Plan Continue current medications  Pain   Patient is currently controlled on the following medications:   Gabapentin 100m43m bedtime  We discussed:    Still has a little pain but bad at all  Discussed that gabapentin dose can be increased if pain worsens or is not controlled  Plan Continue current medications   Vitamin D Deficiency  Vitamin D 23.9 on 07/28/19  Patient has failed these meds in past: N/A Patient is currently controlled on the following medications:   Ergocalciferol 50,000 units twice weekly  We discussed:    Pt reports taking vitamin D on Saturdays and Wednesdays  Plan Continue current medications  Health Maintenance   Patient is currently  on the following medications:  . Aspirin 81mg31mly . Multivitamin daily  Plan Continue current medications  Vaccines   Reviewed and discussed patient's vaccination history.    Immunization History  Administered Date(s) Administered  . Fluad Quad(high Dose 65+) 09/18/2018  . Influenza-Unspecified 09/18/2018, 09/30/2018  . PFIZER SARS-COV-2 Vaccination 02/13/2019, 03/06/2019   Discussed both pneumonia vaccines and Shingrix. Patient open to receiving these vaccines.  Plan Recommended patient receive pneumonia vaccines Recommend Shingrix   Medication Management   Pt uses [facility] for all medications Uses pill  box? No - Organize in a drawer by bottles morning and night, only uses a pill box when traveling Pt endorses 95% compliance, forgets maybe once a month (never forgets AM meds but sometimes PM meds  We discussed:   Importance of taking medications every day as directed  Encouraged use of pill box  Plan Continue  current medication management strategy   Follow up: 3 month phone visit  Jannette Fogo, PharmD Clinical Pharmacist [facility] [phone]

## 2019-09-02 NOTE — Telephone Encounter (Signed)
Patient notified and said that he will pick up the samples today.

## 2019-09-02 NOTE — Patient Instructions (Addendum)
Visit Information  Goals Addressed            This Visit's Progress   . Pharmacy Care Plan       CARE PLAN ENTRY  Current Barriers:  . Chronic Disease Management support, education, and care coordination needs related to Hypertension, Hyperlipidemia, and Diabetes   Hypertension . Pharmacist Clinical Goal(s): o Over the next 90 days, patient will work with PharmD and providers to maintain BP goal <130/80 . Current regimen:  o Amlodipine 2.5mg  daily o Valsartan 160mg  daily . Interventions: o Dietary and exercise recommendations provided - Limit salt intake - Increase physical activity to 30 minutes 3 times weekly, with a long term goal of 30 minutes 5 times weekly . Patient self care activities - Over the next 90 days, patient will: o Check BP daily to every other day and if symptomatic, document, and provide at future appointments o Ensure daily salt intake < 2300 mg/day o Try to exercise for 30 minutes daily 5 times per week  Hyperlipidemia . Pharmacist Clinical Goal(s): o Over the next 90 days, patient will work with PharmD and providers to achieve LDL goal < 70 . Current regimen:  o Pravastatin 40mg  daily . Interventions: o Dietary and exercise recommendations provided - Increase intake of healthy fats (i.e avocados, walnuts, flaxseed) - Decrease intake of foods high in saturated and trans fat (i.e. chips) . Patient self care activities - Over the next 90 days, patient will: o Work to improve diet, increase heart healthy fats and decrease trans/saturated fats o Try to exercise for 30 minutes daily 5 times per week  Diabetes . Pharmacist Clinical Goal(s): o Over the next 90 days, patient will work with PharmD and providers to achieve A1c goal <7% . Current regimen:  o Metformin 1000mg  twice daily . Interventions: o Advised patient to resume checking blood sugar daily and record o Provided dietary and exercise recommendations o Discussed the importance of diet and  exercise to reach hemoglobin A1c goal of less than 7% o Collaborate with PCP regarding patient stopping Farxiga 10mg  . Patient self care activities - Over the next 90 days, patient will: o Check blood sugar once daily, document, and provide at future appointments o Contact provider with any episodes of hypoglycemia o Try to exercise for 30 minutes daily 5 times per week  GERD . Pharmacist Clinical Goal(s) o Over the next 90 days, patient will work with PharmD and providers to manage GERD symptoms . Current regimen:  o Pantoprazole 40mg  daily . Interventions: o Patient expressed desire to take less medications. He has not been experiencing symptoms of acid reflux. o Recommend patient decreasing pantoprazole to every other day o Recommend avoiding foods that may trigger heartburn . Patient self care activities - Over the next 90 days, patient will: o Decrease pantoprazole to every other day o Avoid foods that cause heartburn  Medication management . Pharmacist Clinical Goal(s): o Over the next 90 days, patient will work with PharmD and providers to achieve optimal medication adherence . Current pharmacy: [facility] order . Interventions o Comprehensive medication review performed. o Continue current medication management strategy . Patient self care activities - Over the next 90 days, patient will: o Focus on medication adherence by using a pill box to organize medications o Take medications as prescribed o Report any questions or concerns to PharmD and/or provider(s)  Please see past updates related to this goal by clicking on the "Past Updates" button in the selected goal  The patient verbalized understanding of instructions provided today and agreed to receive a mailed copy of patient instruction and/or educational materials.  Telephone follow up appointment with pharmacy team member scheduled for: 12/07/19 @ 3:30 PM  Jannette Fogo, PharmD Clinical  Pharmacist [facility] [phone]   Diabetes Mellitus and Exercise Exercising regularly is important for your overall health, especially when you have diabetes (diabetes mellitus). Exercising is not only about losing weight. It has many other health benefits, such as increasing muscle strength and bone density and reducing body fat and stress. This leads to improved fitness, flexibility, and endurance, all of which result in better overall health. Exercise has additional benefits for people with diabetes, including:  Reducing appetite.  Helping to lower and control blood glucose.  Lowering blood pressure.  Helping to control amounts of fatty substances (lipids) in the blood, such as cholesterol and triglycerides.  Helping the body to respond better to insulin (improving insulin sensitivity).  Reducing how much insulin the body needs.  Decreasing the risk for heart disease by: ? Lowering cholesterol and triglyceride levels. ? Increasing the levels of good cholesterol. ? Lowering blood glucose levels. What is my activity plan? Your health care provider or certified diabetes educator can help you make a plan for the type and frequency of exercise (activity plan) that works for you. Make sure that you:  Do at least 150 minutes of moderate-intensity or vigorous-intensity exercise each week. This could be brisk walking, biking, or water aerobics. ? Do stretching and strength exercises, such as yoga or weightlifting, at least 2 times a week. ? Spread out your activity over at least 3 days of the week.  Get some form of physical activity every day. ? Do not go more than 2 days in a row without some kind of physical activity. ? Avoid being inactive for more than 30 minutes at a time. Take frequent breaks to walk or stretch.  Choose a type of exercise or activity that you enjoy, and set realistic goals.  Start slowly, and gradually increase the intensity of your  exercise over time. What do I need to know about managing my diabetes?   Check your blood glucose before and after exercising. ? If your blood glucose is 240 mg/dL (13.3 mmol/L) or higher before you exercise, check your urine for ketones. If you have ketones in your urine, do not exercise until your blood glucose returns to normal. ? If your blood glucose is 100 mg/dL (5.6 mmol/L) or lower, eat a snack containing 15-20 grams of carbohydrate. Check your blood glucose 15 minutes after the snack to make sure that your level is above 100 mg/dL (5.6 mmol/L) before you start your exercise.  Know the symptoms of low blood glucose (hypoglycemia) and how to treat it. Your risk for hypoglycemia increases during and after exercise. Common symptoms of hypoglycemia can include: ? Hunger. ? Anxiety. ? Sweating and feeling clammy. ? Confusion. ? Dizziness or feeling light-headed. ? Increased heart rate or palpitations. ? Blurry vision. ? Tingling or numbness around the mouth, lips, or tongue. ? Tremors or shakes. ? Irritability.  Keep a rapid-acting carbohydrate snack available before, during, and after exercise to help prevent or treat hypoglycemia.  Avoid injecting insulin into areas of the body that are going to be exercised. For example, avoid injecting insulin into: ? The arms, when playing tennis. ? The legs, when jogging.  Keep records of your exercise habits. Doing this can help you and your health care  provider adjust your diabetes management plan as needed. Write down: ? Food that you eat before and after you exercise. ? Blood glucose levels before and after you exercise. ? The type and amount of exercise you have done. ? When your insulin is expected to peak, if you use insulin. Avoid exercising at times when your insulin is peaking.  When you start a new exercise or activity, work with your health care provider to make sure the activity is safe for you, and to adjust your insulin,  medicines, or food intake as needed.  Drink plenty of water while you exercise to prevent dehydration or heat stroke. Drink enough fluid to keep your urine clear or pale yellow. Summary  Exercising regularly is important for your overall health, especially when you have diabetes (diabetes mellitus).  Exercising has many health benefits, such as increasing muscle strength and bone density and reducing body fat and stress.  Your health care provider or certified diabetes educator can help you make a plan for the type and frequency of exercise (activity plan) that works for you.  When you start a new exercise or activity, work with your health care provider to make sure the activity is safe for you, and to adjust your insulin, medicines, or food intake as needed. This information is not intended to replace advice given to you by your health care provider. Make sure you discuss any questions you have with your health care provider. Document Revised: 07/18/2016 Document Reviewed: 06/05/2015 Elsevier Patient Education  [place].

## 2019-09-02 NOTE — Telephone Encounter (Signed)
-----   Message from [name], MD sent at 09/02/2019  2:45 PM EDT ----- Regarding: FW: CCM Visit Please have him pick up farxiga 5 - for control of diabetes. He reports that 10mg  makes him urinate too frequently.  ----- Message ----- From: Cyril Mourning, [place]: 09/02/2019   2:20 PM EDT To: [name], MD Subject: RE: CCM Visit                                  That sounds good! Thank you. I assumed that conversation may have gone a little differently than he said lol. He did mention that he did not have an issue with the [place] 5mg  so we could consider going back down to that dose if needed.  ----- Message ----- From: [name], MD Sent: 09/02/2019  11:14 AM EDT To: Cyril Mourning, RPH Subject: RE: CCM Visit                                  He told me that it caused increased urination, and I advised him it was supposed to. [facility] We will schedule him for nurse visit appt next week for bp check. I will have staff notify him to take loratadine for his sinus symptoms.  ----- Message ----- From: Cyril Mourning, [facility] Sent: 09/02/2019   9:28 AM EDT To: [name], MD Subject: CCM Visit                                      Good morning! During my phone visit with this patient today he mentioned headaches for a couple months worsening recently (which he feel like are sinus headaches--states pain is around his eyes and at the back of his neck). He also mentioned his BP being up some recently, but said he did not think the headaches were related to that.   He also stopped taking his Farxiga 10mg  due to it making him urinate frequently. He said he mentioned this to you at his last office visit but I wanted to be sure you were aware. He has not been checking his BG consistently but it was less than 130 this morning. I asked him to check once daily and I will follow up in a few weeks to see how it is.

## 2019-09-07 ENCOUNTER — Other Ambulatory Visit: Payer: Self-pay

## 2019-09-07 ENCOUNTER — Ambulatory Visit: Payer: Medicare HMO

## 2019-09-07 VITALS — BP 120/82 | HR 84 | Temp 97.4°F | Ht 69.4 in | Wt 209.0 lb

## 2019-09-07 DIAGNOSIS — Z0130 Encounter for examination of blood pressure without abnormal findings: Secondary | ICD-10-CM

## 2019-09-07 MED ORDER — AMLODIPINE BESYLATE 2.5 MG PO TABS: 2.5000 mg | ORAL_TABLET | Freq: Every day | ORAL | 2 refills | Status: DC

## 2019-09-07 MED ORDER — TAMSULOSIN HCL 0.4 MG PO CAPS: 0.4000 mg | ORAL_CAPSULE | Freq: Two times a day (BID) | ORAL | 2 refills | Status: DC

## 2019-09-07 MED ORDER — PANTOPRAZOLE SODIUM 40 MG PO TBEC: 40.0000 mg | DELAYED_RELEASE_TABLET | Freq: Every day | ORAL | 2 refills | Status: DC

## 2019-09-07 NOTE — Progress Notes (Signed)
Pt presents today for a b/p check he is currently taking  amlodipine 2.5 at night and valsartan 160mg  in the morning. B/p today 120/82 this morning around 4 am pt stated his b/p was 146/85 he checked it right after taking his medication.  Provider aware

## 2019-09-13 ENCOUNTER — Encounter: Payer: Self-pay | Admitting: Internal Medicine

## 2019-09-15 ENCOUNTER — Other Ambulatory Visit: Payer: Self-pay

## 2019-09-15 ENCOUNTER — Telehealth: Payer: Medicare HMO

## 2019-09-15 ENCOUNTER — Ambulatory Visit: Payer: Self-pay

## 2019-09-15 DIAGNOSIS — I129 Hypertensive chronic kidney disease with stage 1 through stage 4 chronic kidney disease, or unspecified chronic kidney disease: Secondary | ICD-10-CM

## 2019-09-15 DIAGNOSIS — E1122 Type 2 diabetes mellitus with diabetic chronic kidney disease: Secondary | ICD-10-CM

## 2019-09-15 DIAGNOSIS — E559 Vitamin D deficiency, unspecified: Secondary | ICD-10-CM

## 2019-09-21 NOTE — Chronic Care Management (AMB) (Signed)
Chronic Care Management   Follow Up Note   09/15/2019 Name: Kelly Lambert MRN: [id number] DOB: [birth year]  Referred by: Kelly Chard, MD Reason for referral : Chronic Care Management (FU RN CM Call )   Kelly Lambert is a 69 y.o. year old male who is a primary care patient of Kelly Chard, MD. The CCM team was consulted for assistance with chronic disease management and care coordination needs.    Review of patient status, including review of consultants reports, relevant laboratory and other test results, and collaboration with appropriate care team members and the patient's provider was performed as part of comprehensive patient evaluation and provision of chronic care management services.    SDOH (Social Determinants of Health) assessments performed: Yes - no acute challenges identified  See Care Plan activities for detailed interventions related to [place])   Placed outbound call to patient for a CCM RN CM care plan update.     Outpatient Encounter Medications as of 09/15/2019  Medication Sig   amLODipine (NORVASC) 2.5 MG tablet Take 1 tablet (2.5 mg total) by mouth daily. Take 1 tablet at night for high blood pressure   aspirin EC 81 MG tablet Take 81 mg by mouth daily.   Blood Glucose Monitoring Suppl (ACCU-CHEK GUIDE) w/Device KIT 1 kit by Does not apply route 3 (three) times daily.   dapagliflozin propanediol (FARXIGA) 10 MG TABS tablet Take 10 mg by mouth daily before breakfast. (Patient not taking: Reported on 07/27/2019)   gabapentin (NEURONTIN) 100 MG capsule Take 1 capsule (100 mg total) by mouth at bedtime.   glucose blood (ACCU-CHEK GUIDE) test strip Use to check blood sugar 3 times a day. Dx code E11.65   Lancets (ACCU-CHEK MULTICLIX) lancets Use to check blood sugars 3 times a day. Dx code: e11.65   metFORMIN (GLUCOPHAGE) 1000 MG tablet Take 1 tablet (1,000 mg total) by mouth 2 (two) times daily with a meal.   Multiple Vitamin (MULTIVITAMIN) tablet Take 1  tablet by mouth daily.   pantoprazole (PROTONIX) 40 MG tablet Take 1 tablet (40 mg total) by mouth daily.   pravastatin (PRAVACHOL) 40 MG tablet Take 1 tablet (40 mg total) by mouth daily.   tamsulosin (FLOMAX) 0.4 MG CAPS capsule Take 1 capsule (0.4 mg total) by mouth 2 (two) times daily.   valsartan (DIOVAN) 160 MG tablet Take 1 tablet (160 mg total) by mouth daily.   Vitamin D, Ergocalciferol, (DRISDOL) 1.25 MG (50000 UNIT) CAPS capsule Take one capsule po twice weekly on Tuesdays/Fridays   No facility-administered encounter medications on file as of 09/15/2019.     Objective:  Lab Results  Component Value Date   HGBA1C 7.1 (H) 07/28/2019   HGBA1C 7.1 (H) 05/05/2019   HGBA1C 7.7 (H) 02/01/2019   Lab Results  Component Value Date   MICROALBUR 30 07/28/2019   LDLCALC 87 05/05/2019   CREATININE 0.92 07/28/2019   BP Readings from Last 3 Encounters:  09/07/19 120/82  07/28/19 126/84  07/28/19 126/84    Goals Addressed      Patient Stated     "I would like to review nutrtional information to help with meal planning" (pt-stated)   On track     Current Barriers:   Knowledge Deficits related to disease process and Self Health management for Diabetes   Chronic Disease Management support and education needs related to Diabetes type 2 and Hypertension   Nurse Case Manager Clinical Goal(s):   Over the next 30 days, patient will work with Therapist, sports  Care Manager to address Diabetes disease process, Meal Planning and Knowing What to do for Hypo/Hyperglycemic events. Goal Met  New - 11/25/18 - Over the next 30 days, patient will collaborate the the embedded Pharm D Lottie Dawson for comprehensive medication review and to evaluate current diabetic pharmacy regimen Goal Met  New - 11/25/18 - Over the next 90 days, patient will have improved daily glycemic control (80-130) and will have lowered his A1C <7.9 Goal Met   09/15/19 New Over the next 180 days, patient will continue to work with  the CCM team and PCP for disease education and support for improved Self Health management of Diabetes  CCM RN CM Interventions:  09/15/19 call completed with patient   Evaluation of current treatment plan related to Diabetes and patient's adherence to plan as established by provider  Re-educated on patient re: recent A1c 7.1; Re-educated patient on target A1c < 7.0; Educated on daily glycemic control, FBS 80-130, <180 after meals;  Educated on dietary and exercise recommendations   Reviewed medications with patient and discussed patient was instructed to resume taking Farxiga 5 mg bid due to patient experiencing frequent urination with the 10 mg am dose; Determined patient notified Dr. Baird Cancer via my chart he continues to have this SE and will cut the dose down to 5 mg every other day; Determined patient was taking this dose at bedtime and experiencing nocturia, discussed patient will take this medication every other day before breakfast  Advised patient, providing education and rationale, to check cbg daily before meals and record, calling the PCP and or CCM team for findings outside established parameters  Discussed plans with patient for ongoing care management follow up and provided patient with direct contact information for care management team  Patient Self Care Activities:   Self administers medications as prescribed  Attends all scheduled provider appointments  Calls pharmacy for medication refills  Calls provider office for new concerns or questions  Please see past updates related to this goal by clicking on the "Past Updates" button in the selected goal         "to get my Vitamin d level up" (pt-stated)        [place] (see longitudinal plan of care for additional care plan information)  Current Barriers:   Knowledge Deficits related to disease process and Self Health management of Hypertension   Chronic Disease Management support and education needs related  to Hypertension and DMII, Vitamin D deficiency   Nurse Case Manager Clinical Goal(s):   Over the next 90 days, patient will work with the CCM team and PCP to address needs related to disease education and support to improve Self Health management of Vitamin d deficiency   CCM RN CM Interventions:  09/15/19 call completed with patient   Inter-disciplinary care team collaboration (see longitudinal plan of care)  Evaluation of current treatment plan related to Vitamin D deficiency  and patient's adherence to plan as established by provider.  Provided education to patient re: disease process related to cause and effect for Vitamin D deficiency; Educated patient regarding recommendations for treatment of and Self Health management of Vitamin D deficiency   Reviewed medications with patient and discussed patient is adhering to his prescribed treatment for this condition; Current treatment:   o Vitamin D, Ergocalciferol, (DRISDOL) 1.25 MG (50000 UNIT) CAPS capsule, Take one capsule po twice weekly on Tuesdays/Fridays  Discussed plans with patient for ongoing care management follow up and provided patient with direct contact  information for care management team  Provided patient with printed educational materials related to Vitamin D deficiency   Patient Self Care Activities:   Self administers medications as prescribed  Attends all scheduled provider appointments  Calls pharmacy for medication refills  Calls provider office for new concerns or questions  Initial goal documentation       "to keep my BP well controlled" (pt-stated)   On track     Current Barriers:   Knowledge Deficits related to disease process and Self Health management of Hypertension   Chronic Disease Management support and education needs related to Hypertension and DMII  Nurse Case Manager Clinical Goal(s):   09/15/19 New Over the next 180 days, patient will work with the CCM team and PCP to address needs  related to disease education and support for improved Self Health management of Hypertension  Over the next 90 days, patient will verbalize basic understanding of Hypertension disease process and self health management plan as evidenced by patient will be able to verbalize the parameters for his target BP and will be able to Self check his BP at home and routinelyGoal Met  CCM RN CM Interventions:  09/15/19 completed call with patient   Evaluation of current treatment plan related to Hypertension and patient's adherence to plan as established by provider  Re-educated patient re: target BP per AHA and ADA recommendations; 130/80 or lower and unless otherwise directed by MD  Determined patient is self monitoring his BP at home with reported readings at target range  Reviewed medications with patient and discussed patient is adhering to taking his antihypertensive medication exactly as prescribed and w/o missed doses; Current regimen:  o amLODipine (NORVASC) 2.5 MG tablet, Take 1 tablet (2.5 mg total) by mouth daily. Take 1 tablet at night for high blood pressure o valsartan (DIOVAN) 160 MG tablet, Take 1 tablet (160 mg total) by mouth daily  Advised patient, providing education and rationale, to monitor blood pressure daily and record, calling the CCM team and or PCP for findings outside established parameters  Discussed plans with patient for ongoing care management follow up and provided patient with direct contact information for care management team  Patient Self Care Activities:   Self administers medications as prescribed  Attends all scheduled provider appointments  Calls pharmacy for medication refills  Calls provider office for new concerns or questions  Please see past updates related to this goal by clicking on the "Past Updates" button in the selected goal        COMPLETED: I would like to continue optimize my diabetes management (pt-stated)        Current Barriers:    Diabetes: [id number]; complicated by chronic medical conditions including CKD, HTN, most recent A1c 7.9% (was 7% on 07/02/18)  Current antihyperglycemic regimen: metformin 1G BID o Patient wanted to discuss other therapies for diabetes management o Discussed potential transition to GLP1 therapy (SQ vs. PO) o Patient voiced interest in GLP1 therapy and will take some time to research and decide before next PCP appt  Denies hypoglycemic symptoms; denies hyperglycemic symptoms  Current meal patterns: discusses plate method and reducing carbohydrate intake; avoiding sugary drinks  Current exercise: n/a-states winter temps make exercise challenging  Current blood glucose readings: FBG 110-130 in the AM before eating  Cardiovascular risk reduction: o Current hypertensive regimen: valsartan/HCTZ o Current hyperlipidemia regimen: pravastatin o Current antiplatelet regimen: n/a  Pharmacist Clinical Goal(s):   Over the next 90 days, patient will work with PharmD and  primary care provider to address needs related to optimization of diabetes management  Interventions:  Comprehensive medication review performed, medication list updated in electronic medical record  Reviewed & discussed the following diabetes-related information with patient: o Follow ADA recommended "diabetes-friendly" diet  (reviewed healthy snack/food options) o Reviewed medication purpose/side effects-->patient denies adverse events  Patient Self Care Activities:   Patient will check blood glucose 3x weekly , document, and provide at future appointments  Patient will focus on medication adherence by continuing to take medication as prescribed  Patient will take medications as prescribed  Patient will contact provider with any episodes of hypoglycemia  Patient will report any questions or concerns to provider   Initial goal documentation        Plan:   Telephone follow up appointment with care management team  member scheduled for: 12/08/19  Barb Merino, RN, BSN, CCM Care Management Coordinator [place] [facility]  Direct Phone: [phone]

## 2019-09-21 NOTE — Patient Instructions (Addendum)
Visit Information  Goals Addressed      Patient Stated     "I would like to review nutrtional information to help with meal planning" (pt-stated)   On track     Current Barriers:   Knowledge Deficits related to disease process and Self Health management for Diabetes   Chronic Disease Management support and education needs related to Diabetes type 2 and Hypertension   Nurse Case Manager Clinical Goal(s):   Over the next 30 days, patient will work with RN Care Manager to address Diabetes disease process, Meal Planning and Knowing What to do for Hypo/Hyperglycemic events. Goal Met  New - 11/25/18 - Over the next 30 days, patient will collaborate the the embedded Pharm D Lottie Dawson for comprehensive medication review and to evaluate current diabetic pharmacy regimen Goal Met  New - 11/25/18 - Over the next 90 days, patient will have improved daily glycemic control (80-130) and will have lowered his A1C <7.9 Goal Met   09/15/19 New Over the next 180 days, patient will continue to work with the CCM team and PCP for disease education and support for improved Self Health management of Diabetes  CCM RN CM Interventions:  09/15/19 call completed with patient   Evaluation of current treatment plan related to Diabetes and patient's adherence to plan as established by provider  Re-educated on patient re: recent A1c 7.1; Re-educated patient on target A1c < 7.0; Educated on daily glycemic control, FBS 80-130, <180 after meals;  Educated on dietary and exercise recommendations   Reviewed medications with patient and discussed patient was instructed to resume taking Farxiga 5 mg bid due to patient experiencing frequent urination with the 10 mg am dose; Determined patient notified Dr. Baird Cancer via my chart he continues to have this SE and will cut the dose down to 5 mg every other day; Determined patient was taking this dose at bedtime and experiencing nocturia, discussed patient will take this  medication every other day before breakfast  Advised patient, providing education and rationale, to check cbg daily before meals and record, calling the PCP and or CCM team for findings outside established parameters  Discussed plans with patient for ongoing care management follow up and provided patient with direct contact information for care management team  Patient Self Care Activities:   Self administers medications as prescribed  Attends all scheduled provider appointments  Calls pharmacy for medication refills  Calls provider office for new concerns or questions  Please see past updates related to this goal by clicking on the "Past Updates" button in the selected goal         "to get my Vitamin d level up" (pt-stated)        [place] (see longitudinal plan of care for additional care plan information)  Current Barriers:   Knowledge Deficits related to disease process and Self Health management of Hypertension   Chronic Disease Management support and education needs related to Hypertension and DMII, Vitamin D deficiency   Nurse Case Manager Clinical Goal(s):   Over the next 90 days, patient will work with the CCM team and PCP to address needs related to disease education and support to improve Self Health management of Vitamin d deficiency   CCM RN CM Interventions:  09/15/19 call completed with patient   Inter-disciplinary care team collaboration (see longitudinal plan of care)  Evaluation of current treatment plan related to Vitamin D deficiency  and patient's adherence to plan as established by provider.  Provided education  to patient re: disease process related to cause and effect for Vitamin D deficiency; Educated patient regarding recommendations for treatment of and Self Health management of Vitamin D deficiency   Reviewed medications with patient and discussed patient is adhering to his prescribed treatment for this condition; Current treatment:    o Vitamin D, Ergocalciferol, (DRISDOL) 1.25 MG (50000 UNIT) CAPS capsule, Take one capsule po twice weekly on Tuesdays/Fridays  Discussed plans with patient for ongoing care management follow up and provided patient with direct contact information for care management team  Provided patient with printed educational materials related to Vitamin D deficiency   Patient Self Care Activities:   Self administers medications as prescribed  Attends all scheduled provider appointments  Calls pharmacy for medication refills  Calls provider office for new concerns or questions  Initial goal documentation       "to keep my BP well controlled" (pt-stated)   On track     Current Barriers:   Knowledge Deficits related to disease process and Self Health management of Hypertension   Chronic Disease Management support and education needs related to Hypertension and DMII  Nurse Case Manager Clinical Goal(s):   09/15/19 New Over the next 180 days, patient will work with the CCM team and PCP to address needs related to disease education and support for improved Self Health management of Hypertension  Over the next 90 days, patient will verbalize basic understanding of Hypertension disease process and self health management plan as evidenced by patient will be able to verbalize the parameters for his target BP and will be able to Self check his BP at home and routinelyGoal Met  CCM RN CM Interventions:  09/15/19 completed call with patient   Evaluation of current treatment plan related to Hypertension and patient's adherence to plan as established by provider  Re-educated patient re: target BP per AHA and ADA recommendations; 130/80 or lower and unless otherwise directed by MD  Determined patient is self monitoring his BP at home with reported readings at target range  Reviewed medications with patient and discussed patient is adhering to taking his antihypertensive medication exactly as  prescribed and w/o missed doses; Current regimen:  o amLODipine (NORVASC) 2.5 MG tablet, Take 1 tablet (2.5 mg total) by mouth daily. Take 1 tablet at night for high blood pressure o valsartan (DIOVAN) 160 MG tablet, Take 1 tablet (160 mg total) by mouth daily  Advised patient, providing education and rationale, to monitor blood pressure daily and record, calling the CCM team and or PCP for findings outside established parameters  Discussed plans with patient for ongoing care management follow up and provided patient with direct contact information for care management team  Patient Self Care Activities:   Self administers medications as prescribed  Attends all scheduled provider appointments  Calls pharmacy for medication refills  Calls provider office for new concerns or questions  Please see past updates related to this goal by clicking on the "Past Updates" button in the selected goal        COMPLETED: I would like to continue optimize my diabetes management (pt-stated)        Current Barriers:   Diabetes: [id number]; complicated by chronic medical conditions including CKD, HTN, most recent A1c 7.9% (was 7% on 07/02/18)  Current antihyperglycemic regimen: metformin 1G BID o Patient wanted to discuss other therapies for diabetes management o Discussed potential transition to GLP1 therapy (SQ vs. PO) o Patient voiced interest in GLP1 therapy and will take some  time to research and decide before next PCP appt  Denies hypoglycemic symptoms; denies hyperglycemic symptoms  Current meal patterns: discusses plate method and reducing carbohydrate intake; avoiding sugary drinks  Current exercise: n/a-states winter temps make exercise challenging  Current blood glucose readings: FBG 110-130 in the AM before eating  Cardiovascular risk reduction: o Current hypertensive regimen: valsartan/HCTZ o Current hyperlipidemia regimen: pravastatin o Current antiplatelet regimen: n/a  Pharmacist  Clinical Goal(s):   Over the next 90 days, patient will work with PharmD and primary care provider to address needs related to optimization of diabetes management  Interventions:  Comprehensive medication review performed, medication list updated in electronic medical record  Reviewed & discussed the following diabetes-related information with patient: o Follow ADA recommended "diabetes-friendly" diet  (reviewed healthy snack/food options) o Reviewed medication purpose/side effects-->patient denies adverse events  Patient Self Care Activities:   Patient will check blood glucose 3x weekly , document, and provide at future appointments  Patient will focus on medication adherence by continuing to take medication as prescribed  Patient will take medications as prescribed  Patient will contact provider with any episodes of hypoglycemia  Patient will report any questions or concerns to provider   Initial goal documentation       Patient verbalizes understanding of instructions provided today.   Telephone follow up appointment with care management team member scheduled for: 12/08/19  Barb Merino, RN, BSN, CCM Care Management Coordinator [place] [facility]  Direct Phone: [phone]

## 2019-09-27 ENCOUNTER — Encounter: Payer: Self-pay | Admitting: Internal Medicine

## 2019-10-01 ENCOUNTER — Telehealth: Payer: Self-pay

## 2019-10-01 NOTE — Chronic Care Management (AMB) (Signed)
Chronic Care Management Pharmacy Assistant   Name: Kelly Lambert  MRN: [id number] DOB: [birth year]  Reason for Encounter: Disease State/ Hypertension and Diabetes Adherence Call  PCP : [name], MD   Patient Questions:  1.  Have you seen any other providers since your last visit? No   2.  Any changes in your medicines or health? Yes- 09/27/19 Patient contacted Dr. Baird Cancer (PCP) informing he discontinued Farxiga 10 mg due to causing  frequent bathroom use.  Recent Relevant Labs: Lab Results  Component Value Date/Time   HGBA1C 7.1 (H) 07/28/2019 02:56 PM   HGBA1C 7.1 (H) 05/05/2019 11:53 AM   MICROALBUR 30 07/28/2019 02:50 PM   MICROALBUR 10 07/02/2018 12:35 PM    Kidney Function Lab Results  Component Value Date/Time   CREATININE 0.92 07/28/2019 02:56 PM   CREATININE 1.17 05/05/2019 11:53 AM   GFRNONAA 85 07/28/2019 02:56 PM   GFRAA 98 07/28/2019 02:56 PM    . Current antihyperglycemic regimen:  o Metformin 1000 mg o  . What recent interventions/DTPs have been made to improve glycemic control:  o Patient continues to check blood sugars daily. Patient discussed with PCP on stopping Farxiga due to having more frequent urination after starting medication. Diet has improved and patient plans to restart exercising at the [facility]. Marland Kitchen Have there been any recent hospitalizations or ED visits since last visit with CPP? No . Patient denies hypoglycemic symptoms, including Pale, Sweaty, Shaky, Hungry, Nervous/irritable and Vision changes . Patient denies hyperglycemic symptoms, including blurry vision, excessive thirst, fatigue and weakness . How often are you checking your blood sugar? once daily . What are your blood sugars ranging?  o Fasting: 118 o Before meals: none o After meals: none o Bedtime: none . During the week, how often does your blood glucose drop below 70? Never . Are you checking your feet daily/regularly? Yes.  Adherence Review: Is the patient currently  on a STATIN medication? Yes- Pravastatin 40 mg Is the patient currently on ACE/ARB medication? Yes- Valsartan 160 mg Does the patient have >5 day gap between last estimated fill dates? No  Reviewed chart prior to disease state call. Spoke with patient regarding BP  Recent Office Vitals: BP Readings from Last 3 Encounters:  09/07/19 120/82  07/28/19 126/84  07/28/19 126/84   Pulse Readings from Last 3 Encounters:  09/07/19 84  07/28/19 72  07/28/19 72    Wt Readings from Last 3 Encounters:  09/07/19 209 lb (94.8 kg)  07/28/19 211 lb (95.7 kg)  07/28/19 211 lb 12.8 oz (96.1 kg)     Kidney Function Lab Results  Component Value Date/Time   CREATININE 0.92 07/28/2019 02:56 PM   CREATININE 1.17 05/05/2019 11:53 AM   GFRNONAA 85 07/28/2019 02:56 PM   GFRAA 98 07/28/2019 02:56 PM    BMP Latest Ref Rng & Units 07/28/2019 05/05/2019 02/01/2019  Glucose 65 - 99 mg/dL 121(H) 162(H) 91  BUN 8 - 27 mg/dL 10 13 15   Creatinine 0.76 - 1.27 mg/dL 0.92 1.17 1.13  BUN/Creat Ratio 10 - 24 11 11 13   Sodium 134 - 144 mmol/L 143 143 140  Potassium 3.5 - 5.2 mmol/L 4.3 4.3 4.4  Chloride 96 - 106 mmol/L 105 106 100  CO2 20 - 29 mmol/L 20 22 28   Calcium 8.6 - 10.2 mg/dL 9.7 10.1 10.1    . Current antihypertensive regimen:  o Amlodipine 2.5 mg o Valsartan 160 mg  . How often are you checking your Blood Pressure? daily .  Current home BP readings: 140/85(this afternoon) Patient usually checks blood pressures first thing in the morning, past readings- 135/85, 135/88 and 133/87. . What recent interventions/DTPs have been made by any provider to improve Blood Pressure control since last CPP Visit: Limit salt intake, increase physical activity 30 minutes three times a week. . Any recent hospitalizations or ED visits since last visit with CPP? No . What diet changes have been made to improve Blood Pressure Control?  o Patient states he has cut back on sugar intake, increased water take to 4-5 (12 oz)  bottles a day. . What exercise is being done to improve your Blood Pressure Control?  o Patient plans to restart exercise routine at the [facility].  Adherence Review: Is the patient currently on ACE/ARB medication? Yes- Valsartan 160 mg Does the patient have >5 day gap between last estimated fill dates? No   Allergies:  No Known Allergies  Medications: Outpatient Encounter Medications as of 10/01/2019  Medication Sig  . amLODipine (NORVASC) 2.5 MG tablet Take 1 tablet (2.5 mg total) by mouth daily. Take 1 tablet at night for high blood pressure  . aspirin EC 81 MG tablet Take 81 mg by mouth daily.  . Blood Glucose Monitoring Suppl (ACCU-CHEK GUIDE) w/Device KIT 1 kit by Does not apply route 3 (three) times daily.  . dapagliflozin propanediol (FARXIGA) 10 MG TABS tablet Take 10 mg by mouth daily before breakfast. (Patient not taking: Reported on 07/27/2019)  . fluticasone (FLONASE) 50 MCG/ACT nasal spray Place 1 spray into both nostrils daily.  Marland Kitchen gabapentin (NEURONTIN) 100 MG capsule Take 1 capsule (100 mg total) by mouth at bedtime.  Marland Kitchen glucose blood (ACCU-CHEK GUIDE) test strip Use to check blood sugar 3 times a day. Dx code E11.65  . Lancets (ACCU-CHEK MULTICLIX) lancets Use to check blood sugars 3 times a day. Dx code: e11.65  . metFORMIN (GLUCOPHAGE) 1000 MG tablet Take 1 tablet (1,000 mg total) by mouth 2 (two) times daily with a meal.  . Multiple Vitamin (MULTIVITAMIN) tablet Take 1 tablet by mouth daily.  . pantoprazole (PROTONIX) 40 MG tablet Take 1 tablet (40 mg total) by mouth daily.  . pravastatin (PRAVACHOL) 40 MG tablet Take 1 tablet (40 mg total) by mouth daily.  . tamsulosin (FLOMAX) 0.4 MG CAPS capsule Take 1 capsule (0.4 mg total) by mouth 2 (two) times daily.  . valsartan (DIOVAN) 160 MG tablet Take 1 tablet (160 mg total) by mouth daily.  . Vitamin D, Ergocalciferol, (DRISDOL) 1.25 MG (50000 UNIT) CAPS capsule Take one capsule po twice weekly on Tuesdays/Fridays   No  facility-administered encounter medications on file as of 10/01/2019.    Current Diagnosis: Patient Active Problem List   Diagnosis Date Noted  . Personal history of colonic polyps 03/01/2019  . Type 2 diabetes mellitus with stage 2 chronic kidney disease, without long-term current use of insulin ([place]) 03/01/2019  . Diabetic mononeuropathy associated with type 2 diabetes mellitus ([place]) 11/24/2017  . Hypertensive nephropathy 11/24/2017  . Chronic renal disease, stage II 11/24/2017    Goals Addressed            This Visit's Progress   . Pharmacy Care Plan   On track    CARE PLAN ENTRY  Current Barriers:  . Chronic Disease Management support, education, and care coordination needs related to Hypertension, Hyperlipidemia, and Diabetes   Hypertension . Pharmacist Clinical Goal(s): o Over the next 90 days, patient will work with PharmD and providers to maintain BP goal <  130/80 . Current regimen:  o Amlodipine 2.71m daily o Valsartan 1658mdaily . Interventions: o Dietary and exercise recommendations provided - Limit salt intake - Increase physical activity to 30 minutes 3 times weekly, with a long term goal of 30 minutes 5 times weekly . Patient self care activities - Over the next 90 days, patient will: o Check BP daily to every other day and if symptomatic, document, and provide at future appointments o Ensure daily salt intake < 2300 mg/day o Try to exercise for 30 minutes daily 5 times per week  Hyperlipidemia . Pharmacist Clinical Goal(s): o Over the next 90 days, patient will work with PharmD and providers to achieve LDL goal < 70 . Current regimen:  o Pravastatin 4054maily . Interventions: o Dietary and exercise recommendations provided - Increase intake of healthy fats (i.e avocados, walnuts, flaxseed) - Decrease intake of foods high in saturated and trans fat (i.e. chips) . Patient self care activities - Over the next 90 days, patient will: o Work to improve diet,  increase heart healthy fats and decrease trans/saturated fats o Try to exercise for 30 minutes daily 5 times per week  Diabetes . Pharmacist Clinical Goal(s): o Over the next 90 days, patient will work with PharmD and providers to achieve A1c goal <7% . Current regimen:  o Metformin 1000m66mice daily . Interventions: o Advised patient to resume checking blood sugar daily and record o Provided dietary and exercise recommendations o Discussed the importance of diet and exercise to reach hemoglobin A1c goal of less than 7% o Collaborate with PCP regarding patient stopping Farxiga 10mg67matient self care activities - Over the next 90 days, patient will: o Check blood sugar once daily, document, and provide at future appointments o Contact provider with any episodes of hypoglycemia o Try to exercise for 30 minutes daily 5 times per week  GERD . Pharmacist Clinical Goal(s) o Over the next 90 days, patient will work with PharmD and providers to manage GERD symptoms . Current regimen:  o Pantoprazole 40mg 15my . Interventions: o Patient expressed desire to take less medications. He has not been experiencing symptoms of acid reflux. o Recommend patient decreasing pantoprazole to every other day o Recommend avoiding foods that may trigger heartburn . Patient self care activities - Over the next 90 days, patient will: o Decrease pantoprazole to every other day o Avoid foods that cause heartburn  Medication management . Pharmacist Clinical Goal(s): o Over the next 90 days, patient will work with PharmD and providers to achieve optimal medication adherence . [facility] . Interventions o Comprehensive medication review performed. o Continue current medication management strategy . Patient self care activities - Over the next 90 days, patient will: o Focus on medication adherence by using a pill box to organize medications o Take medications as  prescribed o Report any questions or concerns to PharmD and/or provider(s)  Please see past updates related to this goal by clicking on the "Past Updates" button in the selected goal         Follow-Up:  Pharmacist Review- Patient discussed with PCP on stopping the FarxigIranent states he was having some trouble with frequent urination's but after starting FarxigIran symptoms doubled. Since stopping medication patient is not having urine frequency that much, but he has increased his water intake. CourtnJannette Fogonotified.  TamalaPattricia BossClFriendshipacist Assistant [phone]

## 2019-10-19 ENCOUNTER — Encounter: Payer: Self-pay | Admitting: Internal Medicine

## 2019-10-19 ENCOUNTER — Other Ambulatory Visit: Payer: Self-pay

## 2019-10-19 MED ORDER — METFORMIN HCL 1000 MG PO TABS: 1000.0000 mg | ORAL_TABLET | Freq: Two times a day (BID) | ORAL | 2 refills | Status: DC

## 2019-10-19 MED ORDER — PRAVASTATIN SODIUM 40 MG PO TABS: 40.0000 mg | ORAL_TABLET | Freq: Every day | ORAL | 2 refills | Status: DC

## 2019-10-19 MED ORDER — PANTOPRAZOLE SODIUM 40 MG PO TBEC: 40.0000 mg | DELAYED_RELEASE_TABLET | Freq: Every day | ORAL | 2 refills | Status: DC

## 2019-10-22 ENCOUNTER — Telehealth: Payer: Self-pay | Admitting: Pharmacist

## 2019-10-22 NOTE — Chronic Care Management (AMB) (Signed)
Chronic Care Management Pharmacy Assistant   Name: Kelly Lambert  MRN: [id number] DOB: January 13, 1950  Reason for Encounter: Disease State - Hypertension  Adherence Call.  Patient Questions:  1.  Have you seen any other providers since your last visit?  No   2.  Any changes in your medicines or health? 10/19/2019  Note in chart from patient states his amlodipine is giving him intense head aches - per Dr. Cyndee Brightly  -  Try  taking 1/2 tablet of amlodipine.     PCP : [name], MD  Allergies:  No Known Allergies  Medications: Outpatient Encounter Medications as of 10/22/2019  Medication Sig  . amLODipine (NORVASC) 2.5 MG tablet Take 1 tablet (2.5 mg total) by mouth daily. Take 1 tablet at night for high blood pressure  . aspirin EC 81 MG tablet Take 81 mg by mouth daily.  . Blood Glucose Monitoring Suppl (ACCU-CHEK GUIDE) w/Device KIT 1 kit by Does not apply route 3 (three) times daily.  . dapagliflozin propanediol (FARXIGA) 10 MG TABS tablet Take 10 mg by mouth daily before breakfast. (Patient not taking: Reported on 07/27/2019)  . fluticasone (FLONASE) 50 MCG/ACT nasal spray Place 1 spray into both nostrils daily.  Marland Kitchen gabapentin (NEURONTIN) 100 MG capsule Take 1 capsule (100 mg total) by mouth at bedtime.  Marland Kitchen glucose blood (ACCU-CHEK GUIDE) test strip Use to check blood sugar 3 times a day. Dx code E11.65  . Lancets (ACCU-CHEK MULTICLIX) lancets Use to check blood sugars 3 times a day. Dx code: e11.65  . metFORMIN (GLUCOPHAGE) 1000 MG tablet Take 1 tablet (1,000 mg total) by mouth 2 (two) times daily with a meal.  . Multiple Vitamin (MULTIVITAMIN) tablet Take 1 tablet by mouth daily.  . pantoprazole (PROTONIX) 40 MG tablet Take 1 tablet (40 mg total) by mouth daily.  . pravastatin (PRAVACHOL) 40 MG tablet Take 1 tablet (40 mg total) by mouth daily.  . tamsulosin (FLOMAX) 0.4 MG CAPS capsule Take 1 capsule (0.4 mg total) by mouth 2 (two) times daily.  . valsartan (DIOVAN)  160 MG tablet Take 1 tablet (160 mg total) by mouth daily.  . Vitamin D, Ergocalciferol, (DRISDOL) 1.25 MG (50000 UNIT) CAPS capsule Take one capsule po twice weekly on Tuesdays/Fridays   No facility-administered encounter medications on file as of 10/22/2019.    Current Diagnosis: Patient Active Problem List   Diagnosis Date Noted  . Personal history of colonic polyps 03/01/2019  . Type 2 diabetes mellitus with stage 2 chronic kidney disease, without long-term current use of insulin ([place]) 03/01/2019  . Diabetic mononeuropathy associated with type 2 diabetes mellitus ([place]) 11/24/2017  . Hypertensive nephropathy 11/24/2017  . Chronic renal disease, stage II 11/24/2017   Reviewed chart prior to disease state call. Spoke with patient regarding BP  Recent Office Vitals: BP Readings from Last 3 Encounters:  09/07/19 120/82  07/28/19 126/84  07/28/19 126/84   Pulse Readings from Last 3 Encounters:  09/07/19 84  07/28/19 72  07/28/19 72    Wt Readings from Last 3 Encounters:  09/07/19 209 lb (94.8 kg)  07/28/19 211 lb (95.7 kg)  07/28/19 211 lb 12.8 oz (96.1 kg)     Kidney Function Lab Results  Component Value Date/Time   CREATININE 0.92 07/28/2019 02:56 PM   CREATININE 1.17 05/05/2019 11:53 AM   GFRNONAA 85 07/28/2019 02:56 PM   GFRAA 98 07/28/2019 02:56 PM    BMP Latest Ref Rng & Units 07/28/2019 05/05/2019 02/01/2019  Glucose  65 - 99 mg/dL 121(H) 162(H) 91  BUN 8 - 27 mg/dL 10 13 15   Creatinine 0.76 - 1.27 mg/dL 0.92 1.17 1.13  BUN/Creat Ratio 10 - 24 11 11 13   Sodium 134 - 144 mmol/L 143 143 140  Potassium 3.5 - 5.2 mmol/L 4.3 4.3 4.4  Chloride 96 - 106 mmol/L 105 106 100  CO2 20 - 29 mmol/L 20 22 28   Calcium 8.6 - 10.2 mg/dL 9.7 10.1 10.1    . Current antihypertensive regimen:  o Amlodipine 2.5 mg daily o Valsartan 160 mg daily  . How often are you checking your Blood Pressure ? Patient states he takes his blood pressure one time a day  Current home BP  readings:  10/22/2019 - 124/84 am 10/21/2019 - 129/90 am / 10/20/2019 129/84 / 10/19/2019 114/78   . What recent interventions/DTPs have been made by any provider to improve Blood Pressure control since last CPP Visit:  Patient states that he has been taking his valsartan as directed by provider , states he stopped his amlodipine since Tuesday,  10/19/2019, because of headache, He states that he is going to try to take again tonight and tomorrow night to see if he gets a headache and if he does he will not take again.  . Any recent hospitalizations or ED visits since last visit with CPP? No  . What diet changes have been made to improve Blood Pressure Control?   o Patient states he is eating healthy ,low sodium diet.  . What exercise is being done to improve your Blood Pressure Control?  o Patient walks 9,000 steps a day.  Adherence Review: Is the patient currently on ACE/ARB medication? Yes . Valsartan Does the patient have >5 day gap between last estimated fill dates? No    Goals Addressed            This Visit's Progress   . Pharmacy Care Plan   On track    CARE PLAN ENTRY  Current Barriers:  . Chronic Disease Management support, education, and care coordination needs related to Hypertension, Hyperlipidemia, and Diabetes   Hypertension . Pharmacist Clinical Goal(s): o Over the next 90 days, patient will work with PharmD and providers to maintain BP goal <130/80 . Current regimen:  o Amlodipine 2.38m daily o Valsartan 1627mdaily . Interventions: o Dietary and exercise recommendations provided - Limit salt intake - Increase physical activity to 30 minutes 3 times weekly, with a long term goal of 30 minutes 5 times weekly . Patient self care activities - Over the next 90 days, patient will: o Check BP daily to every other day and if symptomatic, document, and provide at future appointments o Ensure daily salt intake < 2300 mg/day o Try to exercise for 30 minutes daily 5  times per week  Hyperlipidemia . Pharmacist Clinical Goal(s): o Over the next 90 days, patient will work with PharmD and providers to achieve LDL goal < 70 . Current regimen:  o Pravastatin 4064maily . Interventions: o Dietary and exercise recommendations provided - Increase intake of healthy fats (i.e avocados, walnuts, flaxseed) - Decrease intake of foods high in saturated and trans fat (i.e. chips) . Patient self care activities - Over the next 90 days, patient will: o Work to improve diet, increase heart healthy fats and decrease trans/saturated fats o Try to exercise for 30 minutes daily 5 times per week  Diabetes . Pharmacist Clinical Goal(s): o Over the next 90 days, patient will work with PharmD  and providers to achieve A1c goal <7% . Current regimen:  o Metformin 1029m twice daily . Interventions: o Advised patient to resume checking blood sugar daily and record o Provided dietary and exercise recommendations o Discussed the importance of diet and exercise to reach hemoglobin A1c goal of less than 7% o Collaborate with PCP regarding patient stopping Farxiga 114m. Patient self care activities - Over the next 90 days, patient will: o Check blood sugar once daily, document, and provide at future appointments o Contact provider with any episodes of hypoglycemia o Try to exercise for 30 minutes daily 5 times per week  GERD . Pharmacist Clinical Goal(s) o Over the next 90 days, patient will work with PharmD and providers to manage GERD symptoms . Current regimen:  o Pantoprazole 4016maily . Interventions: o Patient expressed desire to take less medications. He has not been experiencing symptoms of acid reflux. o Recommend patient decreasing pantoprazole to every other day o Recommend avoiding foods that may trigger heartburn . Patient self care activities - Over the next 90 days, patient will: o Decrease pantoprazole to every other day o Avoid foods that cause  heartburn  Medication management . Pharmacist Clinical Goal(s): o Over the next 90 days, patient will work with PharmD and providers to achieve optimal medication adherence . [facility] . Interventions o Comprehensive medication review performed. o Continue current medication management strategy . Patient self care activities - Over the next 90 days, patient will: o Focus on medication adherence by using a pill box to organize medications o Take medications as prescribed o Report any questions or concerns to PharmD and/or provider(s)  Please see past updates related to this goal by clicking on the "Past Updates" button in the selected goal         Follow-Up:  Pharmacist Review - Patient wants to know about his vitamin D script he wants to know if he needs another refill.   Patient also gave me his glucose sugar readings for the week. 10/22/2019 - fasting 137  10/21/2019 - fasting 104 10/20/2019 - fasting 138 10/19/2019 - fasting 144 10/18/2019 - fasting 133[facility] [place] [facility]PP. Notified  VerJudithann SheenCMNeiltonarmacist Assistant [phone]

## 2019-11-03 ENCOUNTER — Telehealth: Payer: Self-pay | Admitting: Pharmacist

## 2019-11-03 NOTE — Chronic Care Management (AMB) (Signed)
    Chronic Care Management Pharmacy Assistant   Name: Kelly Lambert  MRN: [id number] DOB: [birth year]  Reason for Encounter: Medication Review - Medication Adherence.   PCP : [name], MD  Allergies:  No Known Allergies  Medications: Outpatient Encounter Medications as of 11/03/2019  Medication Sig  . amLODipine (NORVASC) 2.5 MG tablet Take 1 tablet (2.5 mg total) by mouth daily. Take 1 tablet at night for high blood pressure  . aspirin EC 81 MG tablet Take 81 mg by mouth daily.  . Blood Glucose Monitoring Suppl (ACCU-CHEK GUIDE) w/Device KIT 1 kit by Does not apply route 3 (three) times daily.  . dapagliflozin propanediol (FARXIGA) 10 MG TABS tablet Take 10 mg by mouth daily before breakfast. (Patient not taking: Reported on 07/27/2019)  . fluticasone (FLONASE) 50 MCG/ACT nasal spray Place 1 spray into both nostrils daily.  Marland Kitchen gabapentin (NEURONTIN) 100 MG capsule Take 1 capsule (100 mg total) by mouth at bedtime.  Marland Kitchen glucose blood (ACCU-CHEK GUIDE) test strip Use to check blood sugar 3 times a day. Dx code E11.65  . Lancets (ACCU-CHEK MULTICLIX) lancets Use to check blood sugars 3 times a day. Dx code: e11.65  . metFORMIN (GLUCOPHAGE) 1000 MG tablet Take 1 tablet (1,000 mg total) by mouth 2 (two) times daily with a meal.  . Multiple Vitamin (MULTIVITAMIN) tablet Take 1 tablet by mouth daily.  . pantoprazole (PROTONIX) 40 MG tablet Take 1 tablet (40 mg total) by mouth daily.  . pravastatin (PRAVACHOL) 40 MG tablet Take 1 tablet (40 mg total) by mouth daily.  . tamsulosin (FLOMAX) 0.4 MG CAPS capsule Take 1 capsule (0.4 mg total) by mouth 2 (two) times daily.  . valsartan (DIOVAN) 160 MG tablet Take 1 tablet (160 mg total) by mouth daily.  . Vitamin D, Ergocalciferol, (DRISDOL) 1.25 MG (50000 UNIT) CAPS capsule Take one capsule po twice weekly on Tuesdays/Fridays   No facility-administered encounter medications on file as of 11/03/2019.    Current Diagnosis: Patient Active  Problem List   Diagnosis Date Noted  . Personal history of colonic polyps 03/01/2019  . Type 2 diabetes mellitus with stage 2 chronic kidney disease, without long-term current use of insulin ([place]) 03/01/2019  . Diabetic mononeuropathy associated with type 2 diabetes mellitus ([place]) 11/24/2017  . Hypertensive nephropathy 11/24/2017  . Chronic renal disease, stage II 11/24/2017      Follow-Up:  Pharmacist Review- Reviewed chart and adherence measures . Per insurance data medication adherence for cholesterol (statins) is 90-99% medication compliance . Medication Adherence for diabetes medications is 90-99 % compliance . Medication adherence  for hypertension is 100% compliance.  Christian Davis,CPP . Notified  Judithann Sheen, [place] Pharmacist Assistant [phone]

## 2019-11-26 DIAGNOSIS — E119 Type 2 diabetes mellitus without complications: Secondary | ICD-10-CM | POA: Diagnosis not present

## 2019-11-26 LAB — HM DIABETES EYE EXAM

## 2019-11-30 ENCOUNTER — Other Ambulatory Visit: Payer: Self-pay

## 2019-11-30 MED ORDER — ACCU-CHEK GUIDE CONTROL VI LIQD: 1 refills | Status: DC

## 2019-12-07 ENCOUNTER — Telehealth: Payer: Self-pay

## 2019-12-08 ENCOUNTER — Ambulatory Visit (INDEPENDENT_AMBULATORY_CARE_PROVIDER_SITE_OTHER): Payer: Medicare HMO | Admitting: Internal Medicine

## 2019-12-08 ENCOUNTER — Other Ambulatory Visit: Payer: Self-pay | Admitting: Internal Medicine

## 2019-12-08 ENCOUNTER — Encounter: Payer: Self-pay | Admitting: Internal Medicine

## 2019-12-08 ENCOUNTER — Other Ambulatory Visit: Payer: Self-pay

## 2019-12-08 ENCOUNTER — Ambulatory Visit: Payer: Self-pay

## 2019-12-08 ENCOUNTER — Telehealth: Payer: Medicare HMO

## 2019-12-08 VITALS — BP 152/96 | HR 86 | Temp 98.1°F | Ht 70.6 in | Wt 213.4 lb

## 2019-12-08 DIAGNOSIS — E1122 Type 2 diabetes mellitus with diabetic chronic kidney disease: Secondary | ICD-10-CM

## 2019-12-08 DIAGNOSIS — N182 Chronic kidney disease, stage 2 (mild): Secondary | ICD-10-CM

## 2019-12-08 DIAGNOSIS — I129 Hypertensive chronic kidney disease with stage 1 through stage 4 chronic kidney disease, or unspecified chronic kidney disease: Secondary | ICD-10-CM

## 2019-12-08 DIAGNOSIS — R519 Headache, unspecified: Secondary | ICD-10-CM

## 2019-12-08 DIAGNOSIS — E559 Vitamin D deficiency, unspecified: Secondary | ICD-10-CM

## 2019-12-08 DIAGNOSIS — Z808 Family history of malignant neoplasm of other organs or systems: Secondary | ICD-10-CM | POA: Diagnosis not present

## 2019-12-08 MED ORDER — CHLORTHALIDONE 25 MG PO TABS: 25.0000 mg | ORAL_TABLET | Freq: Every day | ORAL | 0 refills | Status: DC

## 2019-12-08 MED ORDER — ACCU-CHEK GUIDE CONTROL VI LIQD
1 refills | Status: DC
Start: 2019-12-08 — End: 2021-04-23

## 2019-12-08 NOTE — Progress Notes (Signed)
I,Yamilka [employer] as a Education administrator for Maximino Greenland, MD.,have documented all relevant documentation on the behalf of Maximino Greenland, MD,as directed by  Maximino Greenland, MD while in the presence of Maximino Greenland, MD. This visit occurred during the SARS-CoV-2 public health emergency.  Safety protocols were in place, including screening questions prior to the visit, additional usage of staff PPE, and extensive cleaning of exam room while observing appropriate contact time as indicated for disinfecting solutions.  Subjective:     Patient ID: Kelly Lambert , male    DOB: [birth year] , 69 y.o.   MRN: [id number]   Chief Complaint  Patient presents with  . Hypertension    patient stated his blood pressure has been running high. he reports having a headache. he also stated he stopped taking the amlodipine  . Diabetes    HPI  Patient here for a blood pressure f/u. He complains of having headaches. He denies visual disturbances. Not sure what is triggering his sx. No h/o migraines.   Hypertension This is a chronic problem. The current episode started more than 1 year ago. The problem has been gradually improving since onset. The problem is controlled. Associated symptoms include headaches. Pertinent negatives include no blurred vision, chest pain, palpitations or shortness of breath. Risk factors for coronary artery disease include diabetes mellitus, dyslipidemia and male gender. Past treatments include angiotensin blockers and diuretics. The current treatment provides moderate improvement. Compliance problems include exercise.  Hypertensive end-organ damage includes kidney disease.  Diabetes He presents for his follow-up diabetic visit. He has type 2 diabetes mellitus. Hypoglycemia symptoms include headaches. Pertinent negatives for diabetes include no blurred vision and no chest pain. There are no hypoglycemic complications. Diabetic complications include nephropathy. Risk factors for  coronary artery disease include diabetes mellitus, dyslipidemia, hypertension and male sex. He is compliant with treatment most of the time. He is following a diabetic diet. Meal planning includes avoidance of concentrated sweets. He participates in exercise intermittently. His home blood glucose trend is fluctuating minimally. His breakfast blood glucose is taken between 9-10 am. His breakfast blood glucose range is generally 110-130 mg/dl. An ACE inhibitor/angiotensin II receptor blocker is being taken. Eye exam is current.     Past Medical History:  Diagnosis Date  . BPH (benign prostatic hyperplasia)   . Diabetes mellitus without complication ([place])   . Hyperlipidemia associated with type 2 diabetes mellitus ([place])   . Hypertension      Family History  Problem Relation Age of Onset  . Cancer Mother   . Cancer Father   . Cancer Brother      Current Outpatient Medications:  .  aspirin EC 81 MG tablet, Take 81 mg by mouth daily., Disp: , Rfl:  .  Blood Glucose Monitoring Suppl (ACCU-CHEK GUIDE) w/Device KIT, 1 kit by Does not apply route 3 (three) times daily., Disp: 1 kit, Rfl: 3 .  fluticasone (FLONASE) 50 MCG/ACT nasal spray, Place 1 spray into both nostrils daily., Disp: 16 g, Rfl: 2 .  gabapentin (NEURONTIN) 100 MG capsule, Take 1 capsule (100 mg total) by mouth at bedtime., Disp: 90 capsule, Rfl: 2 .  glucose blood (ACCU-CHEK GUIDE) test strip, Use to check blood sugar 3 times a day. Dx code E11.65, Disp: 300 each, Rfl: 2 .  Lancets (ACCU-CHEK MULTICLIX) lancets, Use to check blood sugars 3 times a day. Dx code: e11.65, Disp: 300 each, Rfl: 2 .  metFORMIN (GLUCOPHAGE) 1000 MG tablet, Take 1 tablet (1,000 mg  total) by mouth 2 (two) times daily with a meal., Disp: 180 tablet, Rfl: 2 .  Multiple Vitamin (MULTIVITAMIN) tablet, Take 1 tablet by mouth daily., Disp: , Rfl:  .  pantoprazole (PROTONIX) 40 MG tablet, Take 1 tablet (40 mg total) by mouth daily., Disp: 90 tablet, Rfl: 2 .   pravastatin (PRAVACHOL) 40 MG tablet, Take 1 tablet (40 mg total) by mouth daily., Disp: 90 tablet, Rfl: 2 .  tamsulosin (FLOMAX) 0.4 MG CAPS capsule, Take 1 capsule (0.4 mg total) by mouth 2 (two) times daily., Disp: 160 capsule, Rfl: 2 .  valsartan (DIOVAN) 160 MG tablet, Take 1 tablet (160 mg total) by mouth daily., Disp: 90 tablet, Rfl: 2 .  Vitamin D, Ergocalciferol, (DRISDOL) 1.25 MG (50000 UNIT) CAPS capsule, Take one capsule po twice weekly on Tuesdays/Fridays, Disp: 24 capsule, Rfl: 0 .  amLODipine (NORVASC) 2.5 MG tablet, Take 1 tablet (2.5 mg total) by mouth daily. Take 1 tablet at night for high blood pressure (Patient not taking: Reported on 12/08/2019), Disp: 90 tablet, Rfl: 2 .  Blood Glucose Calibration (ACCU-CHEK GUIDE CONTROL) LIQD, Use as directed, Disp: 1 each, Rfl: 1 .  chlorthalidone (HYGROTON) 25 MG tablet, TAKE 1 TABLET(25 MG) BY MOUTH DAILY, Disp: 90 tablet, Rfl: 1 .  dapagliflozin propanediol (FARXIGA) 10 MG TABS tablet, Take 10 mg by mouth daily before breakfast. (Patient not taking: Reported on 07/27/2019), Disp: 30 tablet, Rfl: 1   Allergies  Allergen Reactions  . Amlodipine Other (See Comments)    Headaches   . Wilder Glade [Dapagliflozin] Other (See Comments)    headaches     Review of Systems  Constitutional: Negative.   Eyes: Negative for blurred vision.  Respiratory: Negative.  Negative for shortness of breath.   Cardiovascular: Negative.  Negative for chest pain and palpitations.  Gastrointestinal: Negative.   Neurological: Positive for headaches.  Psychiatric/Behavioral: Negative.      Today's Vitals   12/08/19 1134  BP: (!) 152/96  Pulse: 86  Temp: 98.1 F (36.7 C)  TempSrc: Oral  Weight: 213 lb 6.4 oz (96.8 kg)  Height: 5' 10.6" (1.793 m)  PainSc: 3   PainLoc: Head   Body mass index is 30.1 kg/m.  Wt Readings from Last 3 Encounters:  12/08/19 213 lb 6.4 oz (96.8 kg)  09/07/19 209 lb (94.8 kg)  07/28/19 211 lb (95.7 kg)    Objective:   Physical Exam Vitals and nursing note reviewed.  Constitutional:      Appearance: Normal appearance.  Cardiovascular:     Rate and Rhythm: Normal rate and regular rhythm.     Heart sounds: Normal heart sounds.  Pulmonary:     Effort: Pulmonary effort is normal.     Breath sounds: Normal breath sounds.  Skin:    General: Skin is warm.  Neurological:     General: No focal deficit present.     Mental Status: He is alert.  Psychiatric:        Mood and Affect: Mood normal.         Assessment And Plan:     1. Hypertensive nephropathy Comments: Chronic, uncontrolled. I question compliance with meds. Advised to take amlodipine nightly. I think this will help with 24 hour control of his bp. Importance of dietary compliance was also stressed to the patient.   2. Type 2 diabetes mellitus with stage 2 chronic kidney disease, without long-term current use of insulin ([place]) Comments: I will check labs as listed below. I will adjust meds as  needed. I am unable to start GLP-1 therapy unfortunately. He does report family h/o thyroid cancer - he is unsure of type.  - CMP14+EGFR - Hemoglobin A1c  3. New onset headache Comments: This is likely related to his uncontrolled BP. He will let me know if his sx persist. May benefit from nightly magnesium supplementation, which could also help with BP control. He is encouraged to let me know if his sx do not resolve w/in 24-48 hours.   4. Family history of thyroid cancer Comments: Specific type is unknown. His Mom had thyroid cancer.       Patient was given opportunity to ask questions. Patient verbalized understanding of the plan and was able to repeat key elements of the plan. All questions were answered to their satisfaction.  Maximino Greenland, MD   I, Maximino Greenland, MD, have reviewed all documentation for this visit. The documentation on 12/19/19 for the exam, diagnosis, procedures, and orders are all accurate and complete.  THE PATIENT IS  ENCOURAGED TO PRACTICE SOCIAL DISTANCING DUE TO THE COVID-19 PANDEMIC.

## 2019-12-08 NOTE — Patient Instructions (Signed)

## 2019-12-09 LAB — HEMOGLOBIN A1C
Est. average glucose Bld gHb Est-mCnc: 160 mg/dL
Hgb A1c MFr Bld: 7.2 % — ABNORMAL HIGH (ref 4.8–5.6)

## 2019-12-09 LAB — CMP14+EGFR
ALT: 12 IU/L (ref 0–44)
AST: 13 IU/L (ref 0–40)
Albumin/Globulin Ratio: 1.4 (ref 1.2–2.2)
Albumin: 4.5 g/dL (ref 3.8–4.8)
Alkaline Phosphatase: 101 IU/L (ref 44–121)
BUN/Creatinine Ratio: 11 (ref 10–24)
BUN: 11 mg/dL (ref 8–27)
Bilirubin Total: 0.3 mg/dL (ref 0.0–1.2)
CO2: 25 mmol/L (ref 20–29)
Calcium: 10.3 mg/dL — ABNORMAL HIGH (ref 8.6–10.2)
Chloride: 102 mmol/L (ref 96–106)
Creatinine, Ser: 1.03 mg/dL (ref 0.76–1.27)
GFR calc Af Amer: 86 mL/min/{1.73_m2} (ref >59)
GFR calc non Af Amer: 74 mL/min/{1.73_m2} (ref >59)
Globulin, Total: 3.2 g/dL (ref 1.5–4.5)
Glucose: 90 mg/dL (ref 65–99)
Potassium: 4.8 mmol/L (ref 3.5–5.2)
Sodium: 140 mmol/L (ref 134–144)
Total Protein: 7.7 g/dL (ref 6.0–8.5)

## 2019-12-09 NOTE — Chronic Care Management (AMB) (Signed)
Chronic Care Management   Follow Up Note   12/08/2019 Name: Kelly Lambert MRN: [id number] DOB: Sep 04, 1950  Referred by: [name], MD Reason for referral : Chronic Care Management (RNCM FU Call )   Kelly Lambert is a 69 y.o. year old male who is a primary care patient of [name], MD. The CCM team was consulted for assistance with chronic disease management and care coordination needs.    Review of patient status, including review of consultants reports, relevant laboratory and other test results, and collaboration with appropriate care team members and the patient's provider was performed as part of comprehensive patient evaluation and provision of chronic care management services.    SDOH (Social Determinants of Health) assessments performed: Yes - no acute challenges identified See Care Plan activities for detailed interventions related to [place])  Placed CCM RN CM outbound call to patient for a care plan update.      Outpatient Encounter Medications as of 12/08/2019  Medication Sig  . amLODipine (NORVASC) 2.5 MG tablet Take 1 tablet (2.5 mg total) by mouth daily. Take 1 tablet at night for high blood pressure (Patient not taking: Reported on 12/08/2019)  . aspirin EC 81 MG tablet Take 81 mg by mouth daily.  . Blood Glucose Calibration (ACCU-CHEK GUIDE CONTROL) LIQD Use as directed  . Blood Glucose Monitoring Suppl (ACCU-CHEK GUIDE) w/Device KIT 1 kit by Does not apply route 3 (three) times daily.  . chlorthalidone (HYGROTON) 25 MG tablet TAKE 1 TABLET(25 MG) BY MOUTH DAILY  . dapagliflozin propanediol (FARXIGA) 10 MG TABS tablet Take 10 mg by mouth daily before breakfast. (Patient not taking: Reported on 07/27/2019)  . fluticasone (FLONASE) 50 MCG/ACT nasal spray Place 1 spray into both nostrils daily.  Marland Kitchen gabapentin (NEURONTIN) 100 MG capsule Take 1 capsule (100 mg total) by mouth at bedtime.  Marland Kitchen glucose blood (ACCU-CHEK GUIDE) test strip Use to check blood sugar 3  times a day. Dx code E11.65  . Lancets (ACCU-CHEK MULTICLIX) lancets Use to check blood sugars 3 times a day. Dx code: e11.65  . metFORMIN (GLUCOPHAGE) 1000 MG tablet Take 1 tablet (1,000 mg total) by mouth 2 (two) times daily with a meal.  . Multiple Vitamin (MULTIVITAMIN) tablet Take 1 tablet by mouth daily.  . pantoprazole (PROTONIX) 40 MG tablet Take 1 tablet (40 mg total) by mouth daily.  . pravastatin (PRAVACHOL) 40 MG tablet Take 1 tablet (40 mg total) by mouth daily.  . tamsulosin (FLOMAX) 0.4 MG CAPS capsule Take 1 capsule (0.4 mg total) by mouth 2 (two) times daily.  . valsartan (DIOVAN) 160 MG tablet Take 1 tablet (160 mg total) by mouth daily.  . Vitamin D, Ergocalciferol, (DRISDOL) 1.25 MG (50000 UNIT) CAPS capsule Take one capsule po twice weekly on Tuesdays/Fridays   No facility-administered encounter medications on file as of 12/08/2019.     Objective:  Lab Results  Component Value Date   HGBA1C 7.2 (H) 12/08/2019   HGBA1C 7.1 (H) 07/28/2019   HGBA1C 7.1 (H) 05/05/2019   Lab Results  Component Value Date   MICROALBUR 30 07/28/2019   LDLCALC 87 05/05/2019   CREATININE 1.03 12/08/2019   BP Readings from Last 3 Encounters:  12/08/19 (!) 152/96  09/07/19 120/82  07/28/19 126/84    Goals Addressed      Patient Stated   .  "I would like to review nutrtional information to help with meal planning" (pt-stated)   On track     Current Barriers:  Marland Kitchen Knowledge  Deficits related to disease process and Self Health management for Diabetes  . Chronic Disease Management support and education needs related to Diabetes type 2 and Hypertension   Nurse Case Manager Clinical Goal(s):  Marland Kitchen Over the next 30 days, patient will work with Consulting civil engineer to address Diabetes disease process, Meal Planning and Knowing What to do for Hypo/Hyperglycemic events. Goal Met . New - 11/25/18 - Over the next 30 days, patient will collaborate the the embedded Pharm D Lottie Dawson for comprehensive  medication review and to evaluate current diabetic pharmacy regimen Goal Met . New - 11/25/18 - Over the next 90 days, patient will have improved daily glycemic control (80-130) and will have lowered his A1C <7.9 Goal Met  . 09/15/19 New Over the next 180 days, patient will continue to work with the CCM team and PCP for disease education and support for improved Self Health management of Diabetes  CCM RN CM Interventions:  12/08/19 call completed with patient  . Evaluation of current treatment plan related to Diabetes and patient's adherence to plan as established by provider . Re-educated on patient re: recent A1c 7.1; Re-educated patient on target A1c < 7.0; Educated on daily glycemic control, FBS 80-130, <180 after meals;  Educated on dietary and exercise recommendations  . Reviewed medications with patient and determined patient is no longer taking [place] due to having adverse reaction; Current regimen:  o  metFORMIN (GLUCOPHAGE) 1000 MG tablet, Take 1 tablet (1,000 mg total) by mouth 2 (two) times daily with a meal . Advised patient, providing education and rationale, to check cbg daily before meals and record, calling the PCP and or CCM team for findings outside established parameters . Mailed printed patient educational material related to Diabetes Meal Planning, Grocery Shopping with Diabetes . Discussed plans with patient for ongoing care management follow up and provided patient with direct contact information for care management team  Patient Self Care Activities:  . Self administers medications as prescribed without missed doses . Attends all scheduled provider appointments . Calls pharmacy for medication refills . Calls provider office for new concerns or questions . Review printed DM educational materials for discussion during next RNCM f/u call . Continue to monitor blood sugars and record readings, contacting the CCM team and or PCP for abnormal readings or concerns . Continue  adherence to ADA diet and exercise as recommended   Please see past updates related to this goal by clicking on the "Past Updates" button in the selected goal     .  "to keep my BP well controlled" (pt-stated)   Not on track     Current Barriers:  Marland Kitchen Knowledge Deficits related to disease process and Self Health management of Hypertension  . Chronic Disease Management support and education needs related to Hypertension and DMII  Nurse Case Manager Clinical Goal(s):  . 09/15/19 New Over the next 180 days, patient will work with the CCM team and PCP to address needs related to disease education and support for improved Self Health management of Hypertension  CCM RN CM Interventions:  12/08/19 completed call with patient  . Evaluation of current treatment plan related to Hypertension and patient's adherence to plan as established by provider . Re-educated patient re: target BP <130/80; Re-educated on dietary and exercise recommendations, including adherence to a low Sodium diet, rationale provided; Educated on primary hypertension disease process and potential etiology  . Determined patient is self monitoring his BP at home with reported readings have been elevated .  Reviewed medications with patient and discussed patient is adhering to taking his antihypertensive medication exactly as prescribed and w/o missed doses; Current regimen:  o amLODipine (NORVASC) 2.5 MG tablet, Take 1 tablet (2.5 mg total) by mouth daily. Take 1 tablet at night for high blood pressure o valsartan (DIOVAN) 160 MG tablet, Take 1 tablet (160 mg total) by mouth daily o newly added  Chlorthalidone 25 mg oral daily, Educated on indication, dosage, frequency and potential SE to report if they occur  . Educated patient with rationale, the benefits of adding a dieretic to his regimen for better BP control  . Mailed printed educational materials related to "What is High Blood Pressure?" and "Why Should I Limit Sodium"? For patient  review and discussion at next [facility] f/u  . Advised patient, providing education and rationale, to monitor blood pressure daily and record, calling the CCM team and or PCP for findings outside established parameters . Discussed plans with patient for ongoing care management follow up and provided patient with direct contact information for care management team  Patient Self Care Activities:  . Self administers medications as prescribed without missed doses . Attend all scheduled provider appointments including BP check f/u nurse visit scheduled for 12/25/19 @ 11:00 as advised by PCP . Call pharmacy for medication refills . Call provider office for new concerns or questions . Review and monitor printed patient education materials for discussion at next [facility] FU call . Continue to monitor BP at home and record readings, notifying the CCM team and or PCP of abnormal readings . Adhere to low Sodium diet and exercise recommendations   Please see past updates related to this goal by clicking on the "Past Updates" button in the selected goal      Plan:   Telephone follow up appointment with care management team member scheduled for: 02/04/20  Barb Merino, RN, BSN, CCM Care Management Coordinator [place] [facility]  Direct Phone: [phone]

## 2019-12-09 NOTE — Patient Instructions (Signed)
Visit Information  Patient Self Care Activities:  . Self administers medications as prescribed without missed doses . Attend all scheduled provider appointments including BP check f/u nurse visit scheduled for 12/25/19 @ 11:00 as advised by PCP . Call pharmacy for medication refills . Call provider office for new concerns or questions . Review and monitor printed patient education materials for discussion at next [facility] FU call . Continue to monitor BP at home and record readings, notifying the CCM team and or PCP of abnormal readings . Adhere to low Sodium diet and exercise recommendations   Patient Self Care Activities:  . Self administers medications as prescribed without missed doses . Attends all scheduled provider appointments . Calls pharmacy for medication refills . Calls provider office for new concerns or questions . Review printed DM educational materials for discussion during next RNCM f/u call . Continue to monitor blood sugars and record readings, contacting the CCM team and or PCP for abnormal readings or concerns . Continue adherence to ADA diet and exercise as recommended      The patient verbalized understanding of instructions, educational materials, and care plan provided today and declined offer to receive copy of patient instructions, educational materials, and care plan.   Telephone follow up appointment with care management team member scheduled for: 02/04/20  Barb Merino, RN, BSN, CCM Care Management Coordinator [place] [facility]  Direct Phone: [phone]

## 2019-12-19 ENCOUNTER — Encounter: Payer: Self-pay | Admitting: Internal Medicine

## 2019-12-20 ENCOUNTER — Encounter: Payer: Self-pay | Admitting: Internal Medicine

## 2019-12-21 ENCOUNTER — Telehealth: Payer: Self-pay

## 2019-12-21 NOTE — Telephone Encounter (Signed)
Per pt he would like to see a heart doctor just for precautions, he haven't seen one since 2013 and just wanted to get things checked out. Per pt he will discuss it with RS at his upcoming appt.

## 2019-12-22 ENCOUNTER — Ambulatory Visit: Payer: Self-pay

## 2019-12-22 DIAGNOSIS — N182 Chronic kidney disease, stage 2 (mild): Secondary | ICD-10-CM

## 2019-12-22 DIAGNOSIS — E559 Vitamin D deficiency, unspecified: Secondary | ICD-10-CM

## 2019-12-22 DIAGNOSIS — E1122 Type 2 diabetes mellitus with diabetic chronic kidney disease: Secondary | ICD-10-CM

## 2019-12-22 NOTE — Chronic Care Management (AMB) (Signed)
Chronic Care Management Pharmacy  Name: Kelly Lambert  MRN: [id number] DOB: 1950/03/22  Chief Complaint/ HPI  Kelly Lambert,  69 y.o. , male presents for their Follow-Up CCM visit with the clinical pharmacist via telephone due to COVID-19 Pandemic. Lived in [place] for a long time and they retired to [facility]. He works at [employer] 5 days a week and then he goes to [employer]. He also does puzzles. He goes to sleep around 8 or 8:30 and then he is up at 4 for work at 5 am.   PCP : [name], MD  Their chronic conditions include: Diabetes with Stage 2 CKD, Hypertensive nephropathy, Pure hypercholesterolemia  Office Visits: 07/28/19 AWV and OV: Presented for annual exam. Complains of bilateral ankle pain. During moderate-severe flares pt has difficulty ambulating, requesting temporary handicapped placard (completed during visit). Diabetic foot exam performed. HgbA1c stable at 7.1%. HTN chronic, well controlled. Kidney function is stable. Vitamin D low (23.9), send in Rx for prescription Vitamin D. EKG showed NSR w/o acute changes. Tdap Rx sent to local pharmacy.   05/05/19 OV: Presented for diabetes check and complaining of bilateral shoulder pain (dull, aching) which started 8 months ago. Samples of Farxiga given, will give Rx if renal function stable. Labs ordered (CMP14+EGFR, HgbA1c, Lipid panel). Referred to Sports Medicine for shoulder pain. Shingles vaccine declined.   03/01/19 OV: Presented for follow up visit regarding diabetes and hypertension. Pt has not started [place]. Tolerating amlodipine 2.54m well (started at last nurse visit). Start Farxiga 552m(add to metformin), will titrate to 1040mValsartan/HCTZ changed to valsartan alone due to adding FarIranollow up in 4 weeks after starting medication. Referred to GI for colonoscopy.   02/09/19 CMA BP Check: Start amlodipine 2.5mg2mily due to BP 148/82 manual, 156/87 pt's cuff. Follow up for OV in 2 weeks.   02/04/19 Telephone: Pt left  message stating he was going back to whole tablet of BP medication because he woke up with a headache and his BP was 140/98.  02/01/19 OV: Presented for diabetes evaluation. Pt reported blood sugars have been above 150 (150-180) for several weeks. Pt denied change in diet or recent illness. Labs ordered (BMP8+EGFR, HgbA1c). Plan to start on Farxiga 5mg 61me daily after HgbA1c review. Will require insulin if A1c above 9. Advised to cut valsartan/HCTZ in half due to orthostatic hypotension. Follow up in 4 weeks.   Consult Visit: 02/18/19 OV w/ [name] BPH  CCM Encounters: 12/25/18 RN: Evaluation of current treatment regimens and chronic conditions. Patient education provided and resources suggested.   12/11/18 PharmD: Discussed GLP-1 as possible treatment option. Pt to consider and research prior to next PCP appt. Comprehensive medication review performed   Medications: Outpatient Encounter Medications as of 12/22/2019  Medication Sig  . amLODipine (NORVASC) 2.5 MG tablet Take 1 tablet (2.5 mg total) by mouth daily. Take 1 tablet at night for high blood pressure (Patient not taking: Reported on 12/08/2019)  . aspirin EC 81 MG tablet Take 81 mg by mouth daily.  . Blood Glucose Calibration (ACCU-CHEK GUIDE CONTROL) LIQD Use as directed  . Blood Glucose Monitoring Suppl (ACCU-CHEK GUIDE) w/Device KIT 1 kit by Does not apply route 3 (three) times daily.  . chlorthalidone (HYGROTON) 25 MG tablet TAKE 1 TABLET(25 MG) BY MOUTH DAILY  . dapagliflozin propanediol (FARXIGA) 10 MG TABS tablet Take 10 mg by mouth daily before breakfast. (Patient not taking: Reported on 07/27/2019)  . fluticasone (FLONASE) 50 MCG/ACT nasal spray Place 1  spray into both nostrils daily.  Marland Kitchen gabapentin (NEURONTIN) 100 MG capsule Take 1 capsule (100 mg total) by mouth at bedtime.  Marland Kitchen glucose blood (ACCU-CHEK GUIDE) test strip Use to check blood sugar 3 times a day. Dx code E11.65  . Lancets (ACCU-CHEK MULTICLIX) lancets  Use to check blood sugars 3 times a day. Dx code: e11.65  . metFORMIN (GLUCOPHAGE) 1000 MG tablet Take 1 tablet (1,000 mg total) by mouth 2 (two) times daily with a meal.  . Multiple Vitamin (MULTIVITAMIN) tablet Take 1 tablet by mouth daily.  . pantoprazole (PROTONIX) 40 MG tablet Take 1 tablet (40 mg total) by mouth daily.  . pravastatin (PRAVACHOL) 40 MG tablet Take 1 tablet (40 mg total) by mouth daily.  . tamsulosin (FLOMAX) 0.4 MG CAPS capsule Take 1 capsule (0.4 mg total) by mouth 2 (two) times daily.  . valsartan (DIOVAN) 160 MG tablet Take 1 tablet (160 mg total) by mouth daily.  . Vitamin D, Ergocalciferol, (DRISDOL) 1.25 MG (50000 UNIT) CAPS capsule Take one capsule po twice weekly on Tuesdays/Fridays   No facility-administered encounter medications on file as of 12/22/2019.   Current Diagnosis/Assessment:    Goals Addressed            This Visit's Progress   . Pharmacy Care Plan       CARE PLAN ENTRY  Current Barriers:  . Chronic Disease Management support, education, and care coordination needs related to Hypertension, Hyperlipidemia, and Diabetes   Hypertension . Pharmacist Clinical Goal(s): o Over the next 90 days, patient will work with PharmD and providers to maintain BP goal <130/80 . Current regimen:  o Amlodipine 2.95m daily o Valsartan 1660mdaily . Interventions: o Dietary and exercise recommendations provided - Limit salt intake - Increase physical activity to 30 minutes 3 times weekly, with a long term goal of 30 minutes 5 times weekly . Patient self care activities - Over the next 90 days, patient will: o Check BP daily to every other day and if symptomatic, document, and provide at future appointments o Ensure daily salt intake < 2300 mg/day o Try to exercise for 30 minutes daily 5 times per week  Hyperlipidemia . Pharmacist Clinical Goal(s): o Over the next 90 days, patient will work with PharmD and providers to achieve LDL goal < 70 . Current  regimen:  o Pravastatin 4061maily . Interventions: o Dietary and exercise recommendations provided - Increase intake of healthy fats (i.e avocados, walnuts, flaxseed) - Decrease intake of foods high in saturated and trans fat (i.e. chips) . Patient self care activities - Over the next 90 days, patient will: o Work to improve diet, increase heart healthy fats and decrease trans/saturated fats o Try to exercise for 30 minutes daily 5 times per week  Diabetes . Pharmacist Clinical Goal(s): o Over the next 90 days, patient will work with PharmD and providers to achieve A1c goal <7% . Current regimen:  o Metformin 1000m72mice daily . Interventions: o Advised patient to resume checking blood sugar daily and record o Provided dietary and exercise recommendations o Discussed the importance of diet and exercise to reach hemoglobin A1c goal of less than 7% . Patient self care activities - Over the next 90 days, patient will: o Check blood sugar once daily, document, and provide at future appointments o Contact provider with any episodes of hypoglycemia o Try to exercise for 30 minutes daily 5 times per week  GERD . Pharmacist Clinical Goal(s) o Over the next 90  days, patient will work with PharmD and providers to manage GERD symptoms . Current regimen:  o Pantoprazole 93m daily . Interventions: o Patient expressed desire to take less medications. He has not been experiencing symptoms of acid reflux. o Recommend patient decreasing pantoprazole to every other day o Recommend avoiding foods that may trigger heartburn . Patient self care activities - Over the next 90 days, patient will: o Decrease pantoprazole to every other day o Avoid foods that cause heartburn  Medication management . Pharmacist Clinical Goal(s): o Over the next 90 days, patient will work with PharmD and providers to achieve optimal medication adherence . [facility] . Interventions o Comprehensive medication review performed. o Continue current medication management strategy . Patient self care activities - Over the next 90 days, patient will: o Focus on medication adherence by using a pill box to organize medications o Take medications as prescribed o Report any questions or concerns to PharmD and/or provider(s)  Please see past updates related to this goal by clicking on the "Past Updates" button in the selected goal         Diabetes   Recent Relevant Labs: Lab Results  Component Value Date/Time   HGBA1C 7.2 (H) 12/08/2019 04:52 PM   HGBA1C 7.1 (H) 07/28/2019 02:56 PM   MICROALBUR 30 07/28/2019 02:50 PM   MICROALBUR 10 07/02/2018 12:35 PM    Checking BG: Daily  Recent FBG Readings: 125 Recent pre-meal BG readings:  Recent 2hr PP BG readings: 145-150 Recent HS BG readings:  Patient has failed these meds in past: FCarlislePatient is currently uncontrolled on the following medications:   Metformin 10070mtwice daily  Last diabetic Foot exam: 07/28/19 Last diabetic Eye exam: 10/09/18 Lab Results  Component Value Date/Time   HMDIABEYEEXA No Retinopathy 11/26/2019 12:00 AM    We discussed:   Pt has not been checking BG since he went on vacation (came home last night)  Recommend pt check BG daily and record Exercise extensively Not working [facility] much lately [facility]  Recommend pt get 30 minutes of moderate intensity exercise daily 5 times a week (150 minutes total per week) Benefits could include up to 0.7% reduction in A1c  Importance of diet and exercise to reach HgbA1c goal less than 7%  Plan Continue current medications  CMA to follow up with patient in 3-4 weeks to review BG readings   Hypertension   Office blood pressures are  BP Readings from Last 3 Encounters:  12/08/19 (!) 152/96  09/07/19 120/82  07/28/19 126/84   Patient has failed these meds in the past: Valsartan/HCTZ Patient is  currently controlled on the following medications:   Amlodipine 2.82m76maily  Valsartan 160m40mily  At one point patient asked to be taken of off the HCTZ  Patient checks BP at home 1-2x per week  Patient home BP readings are ranging: 117/74, 110/72, 130/82  We discussed:  Pt states that his blood pressure is much better taking   Denies changes in diet or exercise  Doesn't add extra salt to food, but wife does cook with food  Recommend limiting salt when cooking food  Recommend checking blood pressure more frequently (every other day to every day and recording)  We went over techniques to check BP   Pt states that he has not seen a correlation with his blood pressure being elevated when he has a headache  Plan Continue current medications    Hyperlipidemia  Lipid Panel     Component Value Date/Time   CHOL 154 05/05/2019 1153   TRIG 158 (H) 05/05/2019 1153   HDL 39 (L) 05/05/2019 1153   CHOLHDL 3.9 05/05/2019 1153   LDLCALC 87 05/05/2019 1153   LABVLDL 28 05/05/2019 1153    The 10-year ASCVD risk score Mikey Bussing DC Jr., et al., 2013) is: 40.4%   Values used to calculate the score:     Age: 53 years     Sex: Male     Is Non-Hispanic African American: Yes     Diabetic: Yes     Tobacco smoker: No     Systolic Blood Pressure: 384 mmHg     Is BP treated: Yes     HDL Cholesterol: 39 mg/dL     Total Cholesterol: 154 mg/dL   Patient has failed these meds in past: N/A Patient is currently uncontrolled on the following medications:   Pravastatin 721m daily  We discussed:  Diet and Exercise extensively:  Limiting carbohydrates  Cut back on potato chips and fried foods     Plan Continue current medications  GERD   Patient has failed these meds in past: N/A Patient is currently controlled on the following medications:   Pantoprazole 459mdaily  We discussed:    Pt has continued to take pantoprazole  Discussed that if pt is interested in decreasing  pill burden, he may be able to stop this medication  Recommend pt to decrease to every other day and see how his symptoms are  Advised pt to avoid foods that trigger heartburn and indigestion  Plan Reduce pantoprazole to every other day and see how symptoms of heartburn are Follow up with patient in 3 months regarding symptoms of GERD  BPH  Managed by Urology  Patient has failed these meds in past: N/A Patient is currently controlled on the following medications:   Tamsulosin 0.21m13m capsule twice daily  Plan Continue current medications  Pain   Patient is currently controlled on the following medications:   Gabapentin 100m53m bedtime  We discussed:    Still has a little pain but not bad at all  Discussed that gabapentin dose can be increased if pain worsens or is not controlled  Plan Continue current medications   Vitamin D Deficiency   Vit D, 25-Hydroxy  Date Value Ref Range Status  07/28/2019 23.9 (L) 30.0 - 100.0 ng/mL Final    Comment:    Vitamin D deficiency has been defined by the Institute of Medicine and an Endocrine Society practice guideline as a level of serum 25-OH vitamin D less than 20 ng/mL (1,2). The Endocrine Society went on to further define vitamin D insufficiency as a level between 21 and 29 ng/mL (2). 1. IOM (Institute of Medicine). 2010. Dietary reference    intakes for calcium and D. [place]    [employer] Holick MF, Binkley Nunez, Bischoff-Ferrari HA, et al.    Evaluation, treatment, and prevention of vitamin D    deficiency: an Endocrine Society clinical practice    guideline. JCEM. 2011 Jul; 96(7):1911-30.      Patient has failed these meds in past: N/A Patient is currently uncontrolled on the following medications:   Ergocalciferol 50,000 units twice weekly  We discussed:    Pt reports taking vitamin D on Saturdays and Wednesdays  Plan Continue current medications  Patient to have recent vitamin  D levels assessed to determine if the current regimen is working  [employer] [employer]  Maintenance   Patient is currently  on the following medications:  . Aspirin 21m daily . Multivitamin daily  Plan Continue current medications  Vaccines   Reviewed and discussed patient's vaccination history.    Immunization History  Administered Date(s) Administered  . Fluad Quad(high Dose 65+) 09/18/2018  . Influenza-Unspecified 09/18/2018, 09/30/2018, 11/02/2019  . PFIZER SARS-COV-2 Vaccination 02/13/2019, 03/06/2019, 10/11/2019   Discussed both pneumonia vaccines and Shingrix. Patient open to receiving these vaccines.  Plan Recommended patient receive pneumonia vaccines Recommend Shingrix  Recommend Tdap  Medication Management   [facility] medications Uses pill box? No - Organize in a drawer by bottles morning and night, only uses a pill box when traveling Pt endorses 95% compliance, forgets maybe once a month (never forgets AM meds but sometimes PM meds  We discussed:   Importance of taking medications every day as directed  Encouraged use of pill box  Plan Continue current medication management strategy   Follow up: 3 month phone visit  VOrlando Penner PharmD Clinical Pharmacist [facility] [phone]

## 2020-01-05 NOTE — Patient Instructions (Signed)
Visit Information  Goals Addressed            This Visit's Progress    Pharmacy Care Plan       CARE PLAN ENTRY  Current Barriers:   Chronic Disease Management support, education, and care coordination needs related to Hypertension, Hyperlipidemia, and Diabetes   Hypertension  Pharmacist Clinical Goal(s): o Over the next 90 days, patient will work with PharmD and providers to maintain BP goal <130/80  Current regimen:  o Amlodipine 2.5mg  daily o Valsartan 160mg  daily  Interventions: o Dietary and exercise recommendations provided - Limit salt intake - Increase physical activity to 30 minutes 3 times weekly, with a long term goal of 30 minutes 5 times weekly  Patient self care activities - Over the next 90 days, patient will: o Check BP daily to every other day and if symptomatic, document, and provide at future appointments o Ensure daily salt intake < 2300 mg/day o Try to exercise for 30 minutes daily 5 times per week  Hyperlipidemia  Pharmacist Clinical Goal(s): o Over the next 90 days, patient will work with PharmD and providers to achieve LDL goal < 70  Current regimen:  o Pravastatin 40mg  daily  Interventions: o Dietary and exercise recommendations provided - Increase intake of healthy fats (i.e avocados, walnuts, flaxseed) - Decrease intake of foods high in saturated and trans fat (i.e. chips)  Patient self care activities - Over the next 90 days, patient will: o Work to improve diet, increase heart healthy fats and decrease trans/saturated fats o Try to exercise for 30 minutes daily 5 times per week  Diabetes  Pharmacist Clinical Goal(s): o Over the next 90 days, patient will work with PharmD and providers to achieve A1c goal <7%  Current regimen:  o Metformin 1000mg  twice daily  Interventions: o Advised patient to resume checking blood sugar daily and record o Provided dietary and exercise recommendations o Discussed the importance of diet and  exercise to reach hemoglobin A1c goal of less than 7%  Patient self care activities - Over the next 90 days, patient will: o Check blood sugar once daily, document, and provide at future appointments o Contact provider with any episodes of hypoglycemia o Try to exercise for 30 minutes daily 5 times per week  GERD  Pharmacist Clinical Goal(s) o Over the next 90 days, patient will work with PharmD and providers to manage GERD symptoms  Current regimen:  o Pantoprazole 40mg  daily  Interventions: o Patient expressed desire to take less medications. He has not been experiencing symptoms of acid reflux. o Recommend patient decreasing pantoprazole to every other day o Recommend avoiding foods that may trigger heartburn  Patient self care activities - Over the next 90 days, patient will: o Decrease pantoprazole to every other day o Avoid foods that cause heartburn  Medication management  Pharmacist Clinical Goal(s): o Over the next 90 days, patient will work with PharmD and providers to achieve optimal medication adherence  [facility]  Interventions o Comprehensive medication review performed. o Continue current medication management strategy  Patient self care activities - Over the next 90 days, patient will: o Focus on medication adherence by using a pill box to organize medications o Take medications as prescribed o Report any questions or concerns to PharmD and/or provider(s)  Please see past updates related to this goal by clicking on the "Past Updates" button in the selected goal         The patient verbalized  understanding of instructions, educational materials, and care plan provided today and declined offer to receive copy of patient instructions, educational materials, and care plan.   The pharmacy team will reach out to the patient again over the next 90 days.  Cherylin Mylar, PharmD Clinical Pharmacist [facility] [phone]

## 2020-01-17 ENCOUNTER — Other Ambulatory Visit: Payer: Self-pay | Admitting: Internal Medicine

## 2020-01-17 ENCOUNTER — Other Ambulatory Visit: Payer: Self-pay

## 2020-01-17 MED ORDER — GABAPENTIN 100 MG PO CAPS
100.0000 mg | ORAL_CAPSULE | Freq: Every day | ORAL | 2 refills | Status: DC
Start: 2020-01-17 — End: 2021-02-06

## 2020-01-17 MED ORDER — CHLORTHALIDONE 25 MG PO TABS: ORAL_TABLET | ORAL | 1 refills | Status: DC

## 2020-01-25 ENCOUNTER — Ambulatory Visit: Payer: Medicare HMO

## 2020-02-02 ENCOUNTER — Encounter: Payer: Self-pay | Admitting: Internal Medicine

## 2020-02-02 ENCOUNTER — Other Ambulatory Visit: Payer: Self-pay

## 2020-02-02 ENCOUNTER — Ambulatory Visit (INDEPENDENT_AMBULATORY_CARE_PROVIDER_SITE_OTHER): Payer: Medicare HMO | Admitting: Internal Medicine

## 2020-02-02 VITALS — BP 124/82 | HR 86 | Temp 97.6°F | Ht 70.6 in | Wt 211.8 lb

## 2020-02-02 DIAGNOSIS — N182 Chronic kidney disease, stage 2 (mild): Secondary | ICD-10-CM | POA: Diagnosis not present

## 2020-02-02 DIAGNOSIS — E7800 Pure hypercholesterolemia, unspecified: Secondary | ICD-10-CM

## 2020-02-02 DIAGNOSIS — R0609 Other forms of dyspnea: Secondary | ICD-10-CM

## 2020-02-02 DIAGNOSIS — E1122 Type 2 diabetes mellitus with diabetic chronic kidney disease: Secondary | ICD-10-CM | POA: Diagnosis not present

## 2020-02-02 DIAGNOSIS — I129 Hypertensive chronic kidney disease with stage 1 through stage 4 chronic kidney disease, or unspecified chronic kidney disease: Secondary | ICD-10-CM | POA: Diagnosis not present

## 2020-02-02 DIAGNOSIS — R0600 Dyspnea, unspecified: Secondary | ICD-10-CM

## 2020-02-02 MED ORDER — CHLORTHALIDONE 25 MG PO TABS: ORAL_TABLET | ORAL | 1 refills | Status: DC

## 2020-02-02 MED ORDER — TAMSULOSIN HCL 0.4 MG PO CAPS: 0.4000 mg | ORAL_CAPSULE | Freq: Two times a day (BID) | ORAL | 2 refills | Status: AC

## 2020-02-02 NOTE — Patient Instructions (Signed)
Diabetes Mellitus and Foot Care Foot care is an important part of your health, especially when you have diabetes. Diabetes may cause you to have problems because of poor blood flow (circulation) to your feet and legs, which can cause your skin to:  Become thinner and drier.  Break more easily.  Heal more slowly.  Peel and crack. You may also have nerve damage (neuropathy) in your legs and feet, causing decreased feeling in them. This means that you may not notice minor injuries to your feet that could lead to more serious problems. Noticing and addressing any potential problems early is the best way to prevent future foot problems. How to care for your feet Foot hygiene  Wash your feet daily with warm water and mild soap. Do not use hot water. Then, pat your feet and the areas between your toes until they are completely dry. Do not soak your feet as this can dry your skin.  Trim your toenails straight across. Do not dig under them or around the cuticle. File the edges of your nails with an emery board or nail file.  Apply a moisturizing lotion or petroleum jelly to the skin on your feet and to dry, brittle toenails. Use lotion that does not contain alcohol and is unscented. Do not apply lotion between your toes.   Shoes and socks  Wear clean socks or stockings every day. Make sure they are not too tight. Do not wear knee-high stockings since they may decrease blood flow to your legs.  Wear shoes that fit properly and have enough cushioning. Always look in your shoes before you put them on to be sure there are no objects inside.  To break in new shoes, wear them for just a few hours a day. This prevents injuries on your feet. Wounds, scrapes, corns, and calluses  Check your feet daily for blisters, cuts, bruises, sores, and redness. If you cannot see the bottom of your feet, use a mirror or ask someone for help.  Do not cut corns or calluses or try to remove them with medicine.  If you  find a minor scrape, cut, or break in the skin on your feet, keep it and the skin around it clean and dry. You may clean these areas with mild soap and water. Do not clean the area with peroxide, alcohol, or iodine.  If you have a wound, scrape, corn, or callus on your foot, look at it several times a day to make sure it is healing and not infected. Check for: ? Redness, swelling, or pain. ? Fluid or blood. ? Warmth. ? Pus or a bad smell.   General tips  Do not cross your legs. This may decrease blood flow to your feet.  Do not use heating pads or hot water bottles on your feet. They may burn your skin. If you have lost feeling in your feet or legs, you may not know this is happening until it is too late.  Protect your feet from hot and cold by wearing shoes, such as at the beach or on hot pavement.  Schedule a complete foot exam at least once a year (annually) or more often if you have foot problems. Report any cuts, sores, or bruises to your health care provider immediately. Where to find more information  American Diabetes Association: www.diabetes.org  Association of Diabetes Care & Education Specialists: www.diabeteseducator.org Contact a health care provider if:  You have a medical condition that increases your risk of infection and   you have any cuts, sores, or bruises on your feet.  You have an injury that is not healing.  You have redness on your legs or feet.  You feel burning or tingling in your legs or feet.  You have pain or cramps in your legs and feet.  Your legs or feet are numb.  Your feet always feel cold.  You have pain around any toenails. Get help right away if:  You have a wound, scrape, corn, or callus on your foot and: ? You have pain, swelling, or redness that gets worse. ? You have fluid or blood coming from the wound, scrape, corn, or callus. ? Your wound, scrape, corn, or callus feels warm to the touch. ? You have pus or a bad smell coming from  the wound, scrape, corn, or callus. ? You have a fever. ? You have a red line going up your leg. Summary  Check your feet every day for blisters, cuts, bruises, sores, and redness.  Apply a moisturizing lotion or petroleum jelly to the skin on your feet and to dry, brittle toenails.  Wear shoes that fit properly and have enough cushioning.  If you have foot problems, report any cuts, sores, or bruises to your health care provider immediately.  Schedule a complete foot exam at least once a year (annually) or more often if you have foot problems. This information is not intended to replace advice given to you by your health care provider. Make sure you discuss any questions you have with your health care provider. Document Revised: 07/15/2019 Document Reviewed: 07/15/2019 Elsevier Patient Education  2021 [employer]  

## 2020-02-02 NOTE — Progress Notes (Signed)
I,Katawbba Wiggins,acting as a Education administrator for Maximino Greenland, MD.,have documented all relevant documentation on the behalf of Maximino Greenland, MD,as directed by  Maximino Greenland, MD while in the presence of Maximino Greenland, MD.  This visit occurred during the SARS-CoV-2 public health emergency.  Safety protocols were in place, including screening questions prior to the visit, additional usage of staff PPE, and extensive cleaning of exam room while observing appropriate contact time as indicated for disinfecting solutions.  Subjective:     Patient ID: Kelly Lambert , male    DOB: 1950-03-31 , 70 y.o.   MRN: [id number]   Chief Complaint  Patient presents with  . Hypertension  . Diabetes    HPI  Patient here for a blood pressure f/u. He reports compliance with meds. He admits that he has made some dietary changes. He has incorporated more fresh/frozen vegetables and eating less canned foods.   He would like to have handicapped sticker form completed. States he has noticed he is more "winded". He does not get SOB while at rest. No associated palpitations or diaphoresis. Sometimes has a "twinge" that occurs on the left side of his chest.   Hypertension This is a chronic problem. The current episode started more than 1 year ago. The problem has been gradually improving since onset. The problem is controlled. Pertinent negatives include no blurred vision, chest pain, palpitations or shortness of breath. Risk factors for coronary artery disease include diabetes mellitus, dyslipidemia and male gender. Past treatments include angiotensin blockers and diuretics. The current treatment provides moderate improvement. Compliance problems include exercise.  Hypertensive end-organ damage includes kidney disease.  Diabetes He presents for his follow-up diabetic visit. He has type 2 diabetes mellitus. Pertinent negatives for diabetes include no blurred vision and no chest pain. There are no hypoglycemic  complications. Diabetic complications include nephropathy. Risk factors for coronary artery disease include diabetes mellitus, dyslipidemia, hypertension and male sex. He is compliant with treatment most of the time. He is following a diabetic diet. Meal planning includes avoidance of concentrated sweets. He participates in exercise intermittently. His home blood glucose trend is fluctuating minimally. His breakfast blood glucose is taken between 9-10 am. His breakfast blood glucose range is generally 110-130 mg/dl. An ACE inhibitor/angiotensin II receptor blocker is being taken. Eye exam is current.     Past Medical History:  Diagnosis Date  . BPH (benign prostatic hyperplasia)   . Diabetes mellitus without complication ([place])   . Hyperlipidemia associated with type 2 diabetes mellitus ([place])   . Hypertension      Family History  Problem Relation Age of Onset  . Thyroid cancer Mother   . Cancer Father   . Cancer Brother      Current Outpatient Medications:  .  Ascorbic Acid (VITAMIN C PO), Take by mouth., Disp: , Rfl:  .  aspirin EC 81 MG tablet, Take 81 mg by mouth daily., Disp: , Rfl:  .  Blood Glucose Calibration (ACCU-CHEK GUIDE CONTROL) LIQD, Use as directed, Disp: 1 each, Rfl: 1 .  Blood Glucose Monitoring Suppl (ACCU-CHEK GUIDE) w/Device KIT, 1 kit by Does not apply route 3 (three) times daily., Disp: 1 kit, Rfl: 3 .  gabapentin (NEURONTIN) 100 MG capsule, Take 1 capsule (100 mg total) by mouth at bedtime., Disp: 90 capsule, Rfl: 2 .  glucose blood (ACCU-CHEK GUIDE) test strip, Use to check blood sugar 3 times a day. Dx code E11.65, Disp: 300 each, Rfl: 2 .  Lancets (ACCU-CHEK MULTICLIX)  lancets, Use to check blood sugars 3 times a day. Dx code: e11.65, Disp: 300 each, Rfl: 2 .  metFORMIN (GLUCOPHAGE) 1000 MG tablet, Take 1 tablet (1,000 mg total) by mouth 2 (two) times daily with a meal. (Patient taking differently: Take 500 mg by mouth 2 (two) times daily with a meal.), Disp: 180  tablet, Rfl: 2 .  Multiple Vitamin (MULTIVITAMIN) tablet, Take 1 tablet by mouth daily., Disp: , Rfl:  .  pantoprazole (PROTONIX) 40 MG tablet, Take 1 tablet (40 mg total) by mouth daily., Disp: 90 tablet, Rfl: 2 .  pravastatin (PRAVACHOL) 40 MG tablet, Take 1 tablet (40 mg total) by mouth daily., Disp: 90 tablet, Rfl: 2 .  valsartan (DIOVAN) 160 MG tablet, Take 1 tablet (160 mg total) by mouth daily., Disp: 90 tablet, Rfl: 2 .  VITAMIN D, CHOLECALCIFEROL, PO, Take by mouth., Disp: , Rfl:  .  chlorthalidone (HYGROTON) 25 MG tablet, TAKE 1 TABLET(25 MG) BY MOUTH DAILY, Disp: 90 tablet, Rfl: 1 .  fluticasone (FLONASE) 50 MCG/ACT nasal spray, Place 1 spray into both nostrils daily., Disp: 16 g, Rfl: 2 .  tamsulosin (FLOMAX) 0.4 MG CAPS capsule, Take 1 capsule (0.4 mg total) by mouth 2 (two) times daily., Disp: 160 capsule, Rfl: 2   Allergies  Allergen Reactions  . Amlodipine Other (See Comments)    Headaches   . Wilder Glade [Dapagliflozin] Other (See Comments)    headaches     Review of Systems  Constitutional: Negative.   Eyes: Negative for blurred vision.  Respiratory: Negative.  Negative for shortness of breath.   Cardiovascular: Negative.  Negative for chest pain and palpitations.  Gastrointestinal: Negative.   Psychiatric/Behavioral: Negative.   All other systems reviewed and are negative.    Today's Vitals   02/02/20 1416  BP: 124/82  Pulse: 86  Temp: 97.6 F (36.4 C)  TempSrc: Oral  Weight: 211 lb 12.8 oz (96.1 kg)  Height: 5' 10.6" (1.793 m)   Body mass index is 29.88 kg/m.  Wt Readings from Last 3 Encounters:  02/02/20 211 lb 12.8 oz (96.1 kg)  12/08/19 213 lb 6.4 oz (96.8 kg)  09/07/19 209 lb (94.8 kg)   BP Readings from Last 3 Encounters:  02/02/20 124/82  12/08/19 (!) 152/96  09/07/19 120/82   Objective:  Physical Exam Vitals and nursing note reviewed.  Constitutional:      Appearance: Normal appearance.  HENT:     Head: Normocephalic and atraumatic.   Eyes:     Extraocular Movements: Extraocular movements intact.  Cardiovascular:     Rate and Rhythm: Normal rate and regular rhythm.     Heart sounds: Normal heart sounds.  Pulmonary:     Breath sounds: Normal breath sounds.  Musculoskeletal:     Cervical back: Normal range of motion.  Skin:    General: Skin is warm.  Neurological:     General: No focal deficit present.     Mental Status: He is alert and oriented to person, place, and time.         Assessment And Plan:     1. Hypertensive nephropathy Comments: Chronic, much improved control. He was congratulated on his lifestyle changes and encouraged to keep up the great work.   2. Type 2 diabetes mellitus with stage 2 chronic kidney disease, without long-term current use of insulin (HCC) Comments: He was given sample of LIbre sensors. He was instructed on how to apply sensor, app was downloaded to his Android phone. He is aware  that his insurance may not cover the sensors because he is not on insulin.   3. Pure hypercholesterolemia Comments: Chronic. I will check lipid panel at his next visit. Encouraged to limit fried foods and to increase fiber intake.   4. Dyspnea on exertion Comments: Requests handicapped placard. Due to cardiac risk factors, I will also refer him to Cardiology for further evaluation. He agrees w/ treatment plan.  - Ambulatory referral to Cardiology   Patient was given opportunity to ask questions. Patient verbalized understanding of the plan and was able to repeat key elements of the plan. All questions were answered to their satisfaction.  Maximino Greenland, MD   I, Maximino Greenland, MD, have reviewed all documentation for this visit. The documentation on 02/02/20 for the exam, diagnosis, procedures, and orders are all accurate and complete.  THE PATIENT IS ENCOURAGED TO PRACTICE SOCIAL DISTANCING DUE TO THE COVID-19 PANDEMIC.

## 2020-02-04 ENCOUNTER — Other Ambulatory Visit: Payer: Self-pay

## 2020-02-04 ENCOUNTER — Telehealth: Payer: Medicare HMO

## 2020-02-04 ENCOUNTER — Encounter: Payer: Self-pay | Admitting: Internal Medicine

## 2020-02-04 ENCOUNTER — Ambulatory Visit: Payer: Medicare HMO | Admitting: Internal Medicine

## 2020-02-04 ENCOUNTER — Ambulatory Visit (INDEPENDENT_AMBULATORY_CARE_PROVIDER_SITE_OTHER): Payer: Medicare HMO

## 2020-02-04 ENCOUNTER — Encounter: Payer: Self-pay | Admitting: Radiology

## 2020-02-04 VITALS — BP 116/68 | HR 75 | Ht 70.0 in | Wt 213.0 lb

## 2020-02-04 DIAGNOSIS — R002 Palpitations: Secondary | ICD-10-CM | POA: Diagnosis not present

## 2020-02-04 DIAGNOSIS — R0600 Dyspnea, unspecified: Secondary | ICD-10-CM

## 2020-02-04 DIAGNOSIS — E782 Mixed hyperlipidemia: Secondary | ICD-10-CM | POA: Diagnosis not present

## 2020-02-04 DIAGNOSIS — I10 Essential (primary) hypertension: Secondary | ICD-10-CM

## 2020-02-04 DIAGNOSIS — E119 Type 2 diabetes mellitus without complications: Secondary | ICD-10-CM | POA: Insufficient documentation

## 2020-02-04 DIAGNOSIS — R0609 Other forms of dyspnea: Secondary | ICD-10-CM | POA: Insufficient documentation

## 2020-02-04 MED ORDER — METOPROLOL TARTRATE 50 MG PO TABS: 50.0000 mg | ORAL_TABLET | Freq: Once | ORAL | 0 refills | Status: DC

## 2020-02-04 NOTE — Patient Instructions (Addendum)
Medication Instructions:  Your physician recommends that you continue on your current medications as directed. Please refer to the Current Medication list given to you today.  *If you need a refill on your cardiac medications before your next appointment, please call your pharmacy*   Testing/Procedures:  1. ZIO XT- Long Term Monitor Instructions   Your physician has requested you wear your ZIO patch monitor__14days.   This is a single patch monitor.  Ir[employer] supplies one patch monitor per enrollment.  Additional stickers are not available.   Please do not apply patch if you will be having a Nuclear Stress Test, Echocardiogram, Cardiac CT, MRI, or Chest Xray during the time frame you would be wearing the monitor. The patch cannot be worn during these tests.  You cannot remove and re-apply the ZIO XT patch monitor.   Your ZIO patch monitor will be sent USPS Priority mail from [facility] directly to your home address. The monitor may also be mailed to a PO BOX if home delivery is not available.   It may take 3-5 days to receive your monitor after you have been enrolled.   Once you have received you monitor, please review enclosed instructions.  Your monitor has already been registered assigning a specific monitor serial # to you.   Applying the monitor   Shave hair from upper left chest.   Hold abrader disc by orange tab.  Rub abrader in 40 strokes over left upper chest as indicated in your monitor instructions.   Clean area with 4 enclosed alcohol pads .  Use all pads to assure are is cleaned thoroughly.  Let dry.   Apply patch as indicated in monitor instructions.  Patch will be place under collarbone on left side of chest with arrow pointing upward.   Rub patch adhesive wings for 2 minutes.Remove white label marked "1".  Remove white label marked "2".  Rub patch adhesive wings for 2 additional minutes.   While looking in a mirror, press and release button in center of patch.   A small green light will flash 3-4 times .  This will be your only indicator the monitor has been turned on.     Do not shower for the first 24 hours.  You may shower after the first 24 hours.   Press button if you feel a symptom. You will hear a small click.  Record Date, Time and Symptom in the Patient Log Book.   When you are ready to remove patch, follow instructions on last 2 pages of Patient Log Book.  Stick patch monitor onto last page of Patient Log Book.   Place Patient Log Book in [place] box.  Use locking tab on box and tape box closed securely.  The [employer] and [employer] has [employer] on it.  Please place in mailbox as soon as possible.  Your physician should have your test results approximately 7 days after the monitor has been mailed back to [facility].   Call [facility] at [phone] if you have questions regarding your ZIO XT patch monitor.  Call them immediately if you see an orange light blinking on your monitor.   If your monitor falls off in less than 4 days contact our Monitor department at [phone].  If your monitor becomes loose or falls off after 4 days call [employer] at [phone] for suggestions on securing your monitor.    2. Your cardiac CT will be scheduled at one of the below locations:  [facility] [place], [place] [address] [phone]   If scheduled at [facility], please arrive at the [facility] main entrance of [facility] 30 minutes prior to test start time. Proceed to the [facility] Radiology Department (first floor) to check-in and test prep.   Please follow these instructions carefully (unless otherwise directed):  Hold all erectile dysfunction medications at least 3 days (72 hrs) prior to test.  On the Night Before the Test: . Be sure to Drink plenty of water. . Do not consume any caffeinated/decaffeinated beverages or chocolate 12 hours prior to your test. . Do  not take any antihistamines 12 hours prior to your test.  On the Day of the Test: . Drink plenty of water. Do not drink any water within one hour of the test. . Do not eat any food 4 hours prior to the test. . You may take your regular medications prior to the test.  . Take metoprolol 29m (Lopressor) two hours prior to test.        After the Test: . Drink plenty of water. . After receiving IV contrast, you may experience a mild flushed feeling. This is normal. . On occasion, you may experience a mild rash up to 24 hours after the test. This is not dangerous. If this occurs, you can take Benadryl 25 mg and increase your fluid intake. . If you experience trouble breathing, this can be serious. If it is severe call 911 IMMEDIATELY. If it is mild, please call our office. . If you take any of these medications: Glipizide/Metformin, please do not take 48 hours after completing test unless otherwise instructed.   Once we have confirmed authorization from your insurance company, we will call you to set up a date and time for your test. Based on how quickly your insurance processes prior authorizations requests, please allow up to 4 weeks to be contacted for scheduling your Cardiac CT appointment. Be advised that routine Cardiac CT appointments could be scheduled as many as 8 weeks after your provider has ordered it.  For non-scheduling related questions, please contact the cardiac imaging nurse navigator should you have any questions/concerns: SMarchia Bond Cardiac Imaging Nurse Navigator MBurley Saver [facility] Cardiac Imaging Nurse [facility] Direct Office Dial: [phone]  For scheduling needs, including cancellations and rescheduling, please call [place] [phone]    Follow-Up: Follow up in 3 months with Dr. CJulieanne Manson

## 2020-02-04 NOTE — Progress Notes (Signed)
Enrolled patient for a 14 day Zio XT Monitor to be mailed to patients home  

## 2020-02-04 NOTE — Progress Notes (Addendum)
Cardiology Office Note:    Date:  02/04/2020   ID:  Kelly Lambert, DOB 01/23/1950, MRN [id number]  PCP:  Sanders, Robyn, MD  [facility] Cardiologist:  No primary care provider on file.  [facility] Electrophysiologist:  None   Referring MD: Sanders, Robyn, MD   CC: DOE Consulted for the evaluation of dyspnea on exertion at the behest of Sanders, Robyn, MD  History of Present Illness:    Kelly Lambert is a 69 y.o. male with a hx of DM with HTN, HLD who presents for evaluation 02/04/20.  Patient notes that he is feeling a bit winded.  Has had no chest pain, chest pressure, chest tightness, chest stinging .  Feels some DOE.  Discomfort occurs with working his part time job, worsens with walking faster, and improves with rest.  Patient exertion notable for 10,000 steps a day and feels no symptoms outside of fatigue.  No shortness of breath at rest.  No PND or orthopnea.  No bendopnea, weight gain, leg swelling , or abdominal swelling.  No syncope or near syncope . Notes that at night, he feels palpitations nocturnally.  These occur and leave spontaneously.  Scares him because he isn't sure what these are.  Has them every other night.  Notes leg pain that improves with activity and walking.  Patient reports prior cardiac testing including heart catheterization in 2013 (after positive stress test)  Ambulatory BP 120/74.   Past Medical History:  Diagnosis Date  . BPH (benign prostatic hyperplasia)   . Diabetes mellitus without complication (HCC)   . Hyperlipidemia associated with type 2 diabetes mellitus (HCC)   . Hypertension     Past Surgical History:  Procedure Laterality Date  . JOINT REPLACEMENT Bilateral 2011   knee    Current Medications: Current Meds  Medication Sig  . Ascorbic Acid (VITAMIN C PO) Take by mouth.  . aspirin EC 81 MG tablet Take 81 mg by mouth daily.  . Blood Glucose Calibration (ACCU-CHEK GUIDE CONTROL) LIQD Use as directed  . Blood Glucose  Monitoring Suppl (ACCU-CHEK GUIDE) w/Device KIT 1 kit by Does not apply route 3 (three) times daily.  . chlorthalidone (HYGROTON) 25 MG tablet TAKE 1 TABLET(25 MG) BY MOUTH DAILY  . fluticasone (FLONASE) 50 MCG/ACT nasal spray Place 1 spray into both nostrils daily.  . gabapentin (NEURONTIN) 100 MG capsule Take 1 capsule (100 mg total) by mouth at bedtime.  . glucose blood (ACCU-CHEK GUIDE) test strip Use to check blood sugar 3 times a day. Dx code E11.65  . Lancets (ACCU-CHEK MULTICLIX) lancets Use to check blood sugars 3 times a day. Dx code: e11.65  . metFORMIN (GLUCOPHAGE) 1000 MG tablet Take 1 tablet (1,000 mg total) by mouth 2 (two) times daily with a meal.  . metoprolol tartrate (LOPRESSOR) 50 MG tablet Take 1 tablet (50 mg total) by mouth once for 1 dose. Take 2 hours prior to Coronary CT  . Multiple Vitamin (MULTIVITAMIN) tablet Take 1 tablet by mouth daily.  . pantoprazole (PROTONIX) 40 MG tablet Take 1 tablet (40 mg total) by mouth daily.  . pravastatin (PRAVACHOL) 40 MG tablet Take 1 tablet (40 mg total) by mouth daily.  . tamsulosin (FLOMAX) 0.4 MG CAPS capsule Take 1 capsule (0.4 mg total) by mouth 2 (two) times daily.  . valsartan (DIOVAN) 160 MG tablet Take 1 tablet (160 mg total) by mouth daily.  . VITAMIN D, CHOLECALCIFEROL, PO Take by mouth.     Allergies:   Amlodipine and   Wilder Glade [dapagliflozin]   Social History   Socioeconomic History  . Marital status: Married    Spouse name: Not on file  . Number of children: 3  . Years of education: Not on file  . Highest education level: Not on file  Occupational History  . Occupation: part time work  Tobacco Use  . Smoking status: Former Smoker    Packs/day: 0.50    Years: 30.00    Pack years: 15.00    Types: Cigarettes    Quit date: 1999    Years since quitting: 23.0  . Smokeless tobacco: Former Systems developer    Types: Sarina Ser    Quit date: 2018  Vaping Use  . Vaping Use: Never used  Substance and Sexual Activity  . Alcohol  use: Not Currently  . Drug use: Not Currently  . Sexual activity: Yes  Other Topics Concern  . Not on file  Social History Narrative   Patient reports he works 7 days per week; currently out of work at his M-F job at [employer] due to [employer] pandemic. 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 Patient participates in daily exercise walking 7-8 miles per day.   Social Determinants of Health   Financial Resource Strain: Low Risk   . Difficulty of Paying Living Expenses: Not hard at all  Food Insecurity: No Food Insecurity  . Worried About Charity fundraiser in the Last Year: Never true  . Ran Out of Food in the Last Year: Never true  Transportation Needs: No Transportation Needs  . Lack of Transportation (Medical): No  . Lack of Transportation (Non-Medical): No  Physical Activity: Inactive  . Days of Exercise per Week: 0 days  . Minutes of Exercise per Session: 0 min  Stress: No Stress Concern Present  . Feeling of Stress : Not at all  Social Connections: Not on file     Family History: The patient's family history includes Cancer in his brother and father; Thyroid cancer in his mother. History of coronary artery disease notable for father. History of heart failure notable for no members. History of arrhythmia notable for no members.  ROS:   Please see the history of present illness.    All other systems reviewed and are negative.  EKGs/Labs/Other Studies Reviewed:    The following studies were reviewed today:  EKG:   02/04/19:  SR Rate 75 WNL  Recent Labs: 07/28/2019: Hemoglobin 13.2; Platelets 198 12/08/2019: ALT 12; BUN 11; Creatinine, Ser 1.03; Potassium 4.8; Sodium 140  Recent Lipid Panel    Component Value Date/Time   CHOL 154 05/05/2019 1153   TRIG 158 (H) 05/05/2019 1153   HDL 39 (L) 05/05/2019 1153   CHOLHDL 3.9 05/05/2019 1153   LDLCALC 87 05/05/2019 1153    Risk Assessment/Calculations:     The 10-year ASCVD  risk score Mikey Bussing DC Brooke Bonito., et al., 2013) is: 26.6%   Values used to calculate the score:     Age: 48 years     Sex: Male     Is Non-Hispanic African American: Yes     Diabetic: Yes     Tobacco smoker: No     Systolic Blood Pressure: 947 mmHg     Is BP treated: Yes     HDL Cholesterol: 39 mg/dL     Total Cholesterol: 154 mg/dL   Physical Exam:    VS:  BP 116/68   Pulse 75   Ht 5' 10" (1.778  m)   Wt 213 lb (96.6 kg)   SpO2 96%   BMI 30.56 kg/m     Wt Readings from Last 3 Encounters:  02/04/20 213 lb (96.6 kg)  02/02/20 211 lb 12.8 oz (96.1 kg)  12/08/19 213 lb 6.4 oz (96.8 kg)     GEN:  Well nourished, well developed in no acute distress HEENT: Normal NECK: No JVD; No carotid bruits LYMPHATICS: No lymphadenopathy CARDIAC: RRR, no murmurs, rubs, gallops RESPIRATORY:  Clear to auscultation without rales, wheezing or rhonchi  ABDOMEN: Soft, non-tender, non-distended MUSCULOSKELETAL:  No edema; No deformity  SKIN: Warm and dry, no leg ulcers or edema; still have hair on legs NEUROLOGIC:  Alert and oriented x 3 PSYCHIATRIC:  Normal affect   ASSESSMENT:    1. DOE (dyspnea on exertion)   2. Diabetes mellitus with coincident hypertension ([place])   3. Mixed hyperlipidemia   4. Palpitations    PLAN:    In order of problems listed above:  DOE concerning for angina equivalent - The patient presents with possibly cardiac DOE - not treadmill - ASCVD risk estimated at 29.6 % - Would recommend CCTA with possible FFR as needed to exclude obstructive CAD and to assess for non-obstructive CAD requiring secondary prevention - will get records from [facility] in [place]  Diabetes with hypertension - ambulatory blood pressure 120, will continue ambulatory BP monitoring; gave education on how to perform ambulatory blood pressure monitoring including the frequency and technique; goal ambulatory blood pressure < 135/85 on average - continue home medications  - discussed  diet (DASH/low sodium), and exercise/weight loss interventions   Hyperlipidemia (mixed) -LDL goal less than 100 -continue current statin - will reassess post CT - gave education on dietary changes  Palpitations; possible SVT - will obtain 14-day non live heart monitor (ZioPatch)  Three months follow up unless new symptoms or abnormal test results warranting change in plan  Would be reasonable for  Video Visit Follow up Would be reasonable for  APP Follow up        Medication Adjustments/Labs and Tests Ordered: Current medicines are reviewed at length with the patient today.  Concerns regarding medicines are outlined above.  Orders Placed This Encounter  Procedures  . CT CORONARY MORPH W/CTA COR W/SCORE W/CA W/CM &/OR WO/CM  . CT CORONARY FRACTIONAL FLOW RESERVE DATA PREP  . CT CORONARY FRACTIONAL FLOW RESERVE FLUID ANALYSIS  . Basic metabolic panel  . LONG TERM MONITOR (3-14 DAYS)  . EKG 12-Lead   Meds ordered this encounter  Medications  . metoprolol tartrate (LOPRESSOR) 50 MG tablet    Sig: Take 1 tablet (50 mg total) by mouth once for 1 dose. Take 2 hours prior to Coronary CT    Dispense:  1 tablet    Refill:  0    There are no Patient Instructions on file for this visit.   Signed, Werner Lean, MD  02/04/2020 12:20 PM    [facility]  Reviewed Prior notes from [place]:  No reference to stress testing.  Records comprise 2011-2014.

## 2020-02-08 DIAGNOSIS — R002 Palpitations: Secondary | ICD-10-CM | POA: Diagnosis not present

## 2020-02-09 ENCOUNTER — Telehealth: Payer: Medicare HMO

## 2020-02-09 ENCOUNTER — Telehealth: Payer: Self-pay

## 2020-02-09 ENCOUNTER — Ambulatory Visit (INDEPENDENT_AMBULATORY_CARE_PROVIDER_SITE_OTHER): Payer: Medicare HMO

## 2020-02-09 DIAGNOSIS — E1122 Type 2 diabetes mellitus with diabetic chronic kidney disease: Secondary | ICD-10-CM

## 2020-02-09 DIAGNOSIS — E559 Vitamin D deficiency, unspecified: Secondary | ICD-10-CM

## 2020-02-09 DIAGNOSIS — I129 Hypertensive chronic kidney disease with stage 1 through stage 4 chronic kidney disease, or unspecified chronic kidney disease: Secondary | ICD-10-CM | POA: Diagnosis not present

## 2020-02-09 DIAGNOSIS — N182 Chronic kidney disease, stage 2 (mild): Secondary | ICD-10-CM

## 2020-02-09 NOTE — Telephone Encounter (Signed)
Called patient and scheduled lab appointment for Cardiac CT for Monday Feb. 7, 2022.

## 2020-02-10 ENCOUNTER — Telehealth: Payer: Self-pay

## 2020-02-10 NOTE — Chronic Care Management (AMB) (Addendum)
Chronic Care Management Pharmacy Assistant   Name: Kelly Lambert  MRN: [id number] DOB: 1950-08-22  Reason for Encounter: Hypertension and Diabetes Adherence Call  Patient Questions:  1.  Have you seen any other providers since your last visit? Yes, 02/09/20-Little, Claudette Stapler, RN (CCM). 02/04/20-Chandrasekhar, Terisa Starr, MD (OV). 02/02/20-Sanders, Bailey Mech, MD (OV).      2.  Any changes in your medicines or health? Yes, 02/04/20-Metoprolol Tartrate 50 mg (added). 02/02/20-Amlodipine Besylate 2.5 mg (disc), Dapagliflozin Propanediol 10 mg (disc), Ergocalciferol 1.25 MG (50000 Unit) -disc.     PCP : [name], MD  Allergies:   Allergies  Allergen Reactions   Amlodipine Other (See Comments)    Headaches    Farxiga [Dapagliflozin] Other (See Comments)    headaches    Medications: Outpatient Encounter Medications as of 02/10/2020  Medication Sig   Ascorbic Acid (VITAMIN C PO) Take by mouth.   aspirin EC 81 MG tablet Take 81 mg by mouth daily.   Blood Glucose Calibration (ACCU-CHEK GUIDE CONTROL) LIQD Use as directed   Blood Glucose Monitoring Suppl (ACCU-CHEK GUIDE) w/Device KIT 1 kit by Does not apply route 3 (three) times daily.   chlorthalidone (HYGROTON) 25 MG tablet TAKE 1 TABLET(25 MG) BY MOUTH DAILY   fluticasone (FLONASE) 50 MCG/ACT nasal spray Place 1 spray into both nostrils daily.   gabapentin (NEURONTIN) 100 MG capsule Take 1 capsule (100 mg total) by mouth at bedtime.   glucose blood (ACCU-CHEK GUIDE) test strip Use to check blood sugar 3 times a day. Dx code E11.65   Lancets (ACCU-CHEK MULTICLIX) lancets Use to check blood sugars 3 times a day. Dx code: e11.65   metFORMIN (GLUCOPHAGE) 1000 MG tablet Take 1 tablet (1,000 mg total) by mouth 2 (two) times daily with a meal.   metoprolol tartrate (LOPRESSOR) 50 MG tablet Take 1 tablet (50 mg total) by mouth once for 1 dose. Take 2 hours prior to Coronary CT   Multiple Vitamin (MULTIVITAMIN) tablet Take 1 tablet by mouth  daily.   pantoprazole (PROTONIX) 40 MG tablet Take 1 tablet (40 mg total) by mouth daily.   pravastatin (PRAVACHOL) 40 MG tablet Take 1 tablet (40 mg total) by mouth daily.   tamsulosin (FLOMAX) 0.4 MG CAPS capsule Take 1 capsule (0.4 mg total) by mouth 2 (two) times daily.   valsartan (DIOVAN) 160 MG tablet Take 1 tablet (160 mg total) by mouth daily.   VITAMIN D, CHOLECALCIFEROL, PO Take by mouth.   No facility-administered encounter medications on file as of 02/10/2020.    Current Diagnosis: Patient Active Problem List   Diagnosis Date Noted   DOE (dyspnea on exertion) 02/04/2020   Diabetes mellitus with coincident hypertension ([place]) 02/04/2020   Mixed hyperlipidemia 02/04/2020   Palpitations 02/04/2020   Personal history of colonic polyps 03/01/2019   Type 2 diabetes mellitus with stage 2 chronic kidney disease, without long-term current use of insulin ([place]) 03/01/2019   Diabetic mononeuropathy associated with type 2 diabetes mellitus ([place]) 11/24/2017   Hypertensive nephropathy 11/24/2017   Chronic renal disease, stage II 11/24/2017   Recent Relevant Labs: Lab Results  Component Value Date/Time   HGBA1C 7.2 (H) 12/08/2019 04:52 PM   HGBA1C 7.1 (H) 07/28/2019 02:56 PM   MICROALBUR 30 07/28/2019 02:50 PM   MICROALBUR 10 07/02/2018 12:35 PM    Kidney Function Lab Results  Component Value Date/Time   CREATININE 1.03 12/08/2019 04:52 PM   CREATININE 0.92 07/28/2019 02:56 PM   GFRNONAA 74 12/08/2019 04:52  PM   GFRAA 86 12/08/2019 04:52 PM    Current antihyperglycemic regimen:  Metformin 1041m twice daily  What recent interventions/DTPs have been made to improve glycemic control:  Recent interventions was to check blood sugar once daily, document, and provide at future appointments, Contact provider with any episodes of hypoglycemia, Try to exercise for 30 minutes daily 5 times per week. Patient reports he is taking medication as prescribed. Patient states he is checking his  blood sugars 3 to 4 times daily.  Have there been any recent hospitalizations or ED visits since last visit with CPP? No   Patient denies hypoglycemic symptoms, including Pale, Sweaty, Shaky, Hungry, Nervous/irritable and Vision changes   Patient denies hyperglycemic symptoms, including blurry vision, excessive thirst, fatigue, polyuria and weakness   How often are you checking your blood sugar? 3-4 times daily   What are your blood sugars ranging? [id number] 128 on 02/10/20. Fasting: none Before meals: none After meals:none Bedtime: none  During the week, how often does your blood glucose drop below 70? Never   Are you checking your feet daily/regularly? Patient states he checks his feet daily.  Adherence Review: Is the patient currently on a STATIN medication? Yes Is the patient currently on ACE/ARB medication? Yes Does the patient have >5 day gap between last estimated fill dates? No   Reviewed chart prior to disease state call. Spoke with patient regarding BP  Recent Office Vitals: BP Readings from Last 3 Encounters:  02/04/20 116/68  02/02/20 124/82  12/08/19 (!) 152/96   Pulse Readings from Last 3 Encounters:  02/04/20 75  02/02/20 86  12/08/19 86    Wt Readings from Last 3 Encounters:  02/04/20 213 lb (96.6 kg)  02/02/20 211 lb 12.8 oz (96.1 kg)  12/08/19 213 lb 6.4 oz (96.8 kg)     Kidney Function Lab Results  Component Value Date/Time   CREATININE 1.03 12/08/2019 04:52 PM   CREATININE 0.92 07/28/2019 02:56 PM   GFRNONAA 74 12/08/2019 04:52 PM   GFRAA 86 12/08/2019 04:52 PM    BMP Latest Ref Rng & Units 12/08/2019 07/28/2019 05/05/2019  Glucose 65 - 99 mg/dL 90 121(H) 162(H)  BUN 8 - 27 mg/dL _0 Creatinine 0.76 - 1.27 mg/dL 1.03 0.92 1.17  BUN/Creat Ratio 10 - _1 Sodium 134 - 144 mmol/L 140 143 143  Potassium 3.5 - 5.2 mmol/L 4.8 4.3 4.3  Chloride 96 - 106 mmol/L 102 105 106  CO2 20 - 29 mmol/L _2 Calcium 8.6 - 10.2 mg/dL  10.3(H) 9.7 10.1    Current antihypertensive regimen:  Chlorthalidone 25 MG tablet daily Valsartan 1680mdaily  How often are you checking your Blood Pressure? daily   Current home BP readings: 110/74 on 02/10/20.  What recent interventions/DTPs have been made by any provider to improve Blood Pressure control since last CPP Visit:   Recent interventions was to check BP daily to every other day and if symptomatic, document, and provide at future appointments, Ensure daily salt intake < 2300 mg/day, Try to exercise for 30 minutes daily 5 times per week.   Any recent hospitalizations or ED visits since last visit with CPP? No   What diet changes have been made to improve Blood Pressure Control?  Patient reports he has started eating wheat bread and whole grain pasta, cut back on canned foods, eating steak, pork, fish, chicken, hamburgers, collard greens, cabbage, broccoli florets, and fresh green beans.  Patient stated  he does not add any salt to foods, only pepper.  What exercise is being done to improve your Blood Pressure Control?  Patient stated he walks around the house for 10 to 15 minutes. Patient also stated he will start going to the [facility], riding bike, and taking walks.  Adherence Review: Is the patient currently on ACE/ARB medication? Yes Does the patient have >5 day gap between last estimated fill dates? No   Goals Addressed             This Visit's Progress    Pharmacy Care Plan   Not on track    CARE PLAN ENTRY  Current Barriers:  Chronic Disease Management support, education, and care coordination needs related to Hypertension, Hyperlipidemia, and Diabetes   Hypertension Pharmacist Clinical Goal(s): Over the next 90 days, patient will work with PharmD and providers to maintain BP goal <130/80 Current regimen:  Amlodipine 2.52m daily Valsartan 1666mdaily Interventions: Dietary and exercise recommendations provided Limit salt intake Increase physical activity  to 30 minutes 3 times weekly, with a long term goal of 30 minutes 5 times weekly Patient self care activities - Over the next 90 days, patient will: Check BP daily to every other day and if symptomatic, document, and provide at future appointments Ensure daily salt intake < 2300 mg/day Try to exercise for 30 minutes daily 5 times per week  Hyperlipidemia Pharmacist Clinical Goal(s): Over the next 90 days, patient will work with PharmD and providers to achieve LDL goal < 70 Current regimen:  Pravastatin 4089maily Interventions: Dietary and exercise recommendations provided Increase intake of healthy fats (i.e avocados, walnuts, flaxseed) Decrease intake of foods high in saturated and trans fat (i.e. chips) Patient self care activities - Over the next 90 days, patient will: Work to improve diet, increase heart healthy fats and decrease trans/saturated fats Try to exercise for 30 minutes daily 5 times per week  Diabetes Pharmacist Clinical Goal(s): Over the next 90 days, patient will work with PharmD and providers to achieve A1c goal <7% Current regimen:  Metformin 1000m29mice daily Interventions: Advised patient to resume checking blood sugar daily and record Provided dietary and exercise recommendations Discussed the importance of diet and exercise to reach hemoglobin A1c goal of less than 7% Patient self care activities - Over the next 90 days, patient will: Check blood sugar once daily, document, and provide at future appointments Contact provider with any episodes of hypoglycemia Try to exercise for 30 minutes daily 5 times per week  GERD Pharmacist Clinical Goal(s) Over the next 90 days, patient will work with PharmD and providers to manage GERD symptoms Current regimen:  Pantoprazole 40mg43mly Interventions: Patient expressed desire to take less medications. He has not been experiencing symptoms of acid reflux. Recommend patient decreasing pantoprazole to every other  day Recommend avoiding foods that may trigger heartburn Patient self care activities - Over the next 90 days, patient will: Decrease pantoprazole to every other day Avoid foods that cause heartburn  Medication management Pharmacist Clinical Goal(s): Over the next 90 days, patient will work with PharmD and providers to achieve optimal medication adherence Current pharmacy: [facility] order Interventions Comprehensive medication review performed. Continue current medication management strategy Patient self care activities - Over the next 90 days, patient will: Focus on medication adherence by using a pill box to organize medications Take medications as prescribed Report any questions or concerns to PharmD and/or provider(s)  Please see past updates related to this goal by clicking on  the "Past Updates" button in the selected goal          Follow-Up:  Pharmacist Review-Patient reported he was taken off of Amlodipine 2.5 mg because it was causing him to have headaches. Patient stated his blood sugars was running high around 200 due to eating the wrong foods. Patient states his goal is to bring his hemoglobin A1c down.  Orlando Penner, CPP Notified.  Raynelle Highland, [place] Pharmacist Assistant [phone]  I have reviewed the care management and care coordination activities outlined in this encounter and I am certifying that I agree with the content of this note. No further action required.  6 minutes spent in review, coordination, and documentation.  Mayford Knife, [facility] 02/22/20 12:12 PM

## 2020-02-14 ENCOUNTER — Other Ambulatory Visit: Payer: Medicare HMO | Admitting: *Deleted

## 2020-02-14 ENCOUNTER — Other Ambulatory Visit: Payer: Self-pay

## 2020-02-14 DIAGNOSIS — E119 Type 2 diabetes mellitus without complications: Secondary | ICD-10-CM

## 2020-02-14 DIAGNOSIS — R002 Palpitations: Secondary | ICD-10-CM | POA: Diagnosis not present

## 2020-02-14 DIAGNOSIS — R0609 Other forms of dyspnea: Secondary | ICD-10-CM

## 2020-02-14 DIAGNOSIS — I10 Essential (primary) hypertension: Secondary | ICD-10-CM | POA: Diagnosis not present

## 2020-02-14 DIAGNOSIS — E782 Mixed hyperlipidemia: Secondary | ICD-10-CM

## 2020-02-14 DIAGNOSIS — R0600 Dyspnea, unspecified: Secondary | ICD-10-CM | POA: Diagnosis not present

## 2020-02-14 LAB — BASIC METABOLIC PANEL
BUN/Creatinine Ratio: 14 (ref 10–24)
BUN: 16 mg/dL (ref 8–27)
CO2: 24 mmol/L (ref 20–29)
Calcium: 10.2 mg/dL (ref 8.6–10.2)
Chloride: 99 mmol/L (ref 96–106)
Creatinine, Ser: 1.16 mg/dL (ref 0.76–1.27)
GFR calc Af Amer: 74 mL/min/{1.73_m2} (ref >59)
GFR calc non Af Amer: 64 mL/min/{1.73_m2} (ref >59)
Glucose: 116 mg/dL — ABNORMAL HIGH (ref 65–99)
Potassium: 4 mmol/L (ref 3.5–5.2)
Sodium: 137 mmol/L (ref 134–144)

## 2020-02-16 NOTE — Chronic Care Management (AMB) (Signed)
Chronic Care Management   CCM RN Visit Note  02/09/2020 Name: Kelly Lambert MRN: [id number] DOB: 02/11/1950  Subjective: Kelly Lambert is a 70 y.o. year old male who is a primary care patient of [name], MD. The care management team was consulted for assistance with disease management and care coordination needs.    Engaged with patient by telephone for follow up visit in response to provider referral for case management and/or care coordination services.   Consent to Services:  The patient was given information about Chronic Care Management services, agreed to services, and gave verbal consent prior to initiation of services.  Please see initial visit note for detailed documentation.   Patient agreed to services and verbal consent obtained.   Assessment: Review of patient past medical history, allergies, medications, health status, including review of consultants reports, laboratory and other test data, was performed as part of comprehensive evaluation and provision of chronic care management services.   SDOH (Social Determinants of Health) assessments and interventions performed:  No  CCM Care Plan  Allergies  Allergen Reactions  . Amlodipine Other (See Comments)    Headaches   . Wilder Glade [Dapagliflozin] Other (See Comments)    headaches    Outpatient Encounter Medications as of 02/09/2020  Medication Sig  . Ascorbic Acid (VITAMIN C PO) Take by mouth.  Marland Kitchen aspirin EC 81 MG tablet Take 81 mg by mouth daily.  . Blood Glucose Calibration (ACCU-CHEK GUIDE CONTROL) LIQD Use as directed  . Blood Glucose Monitoring Suppl (ACCU-CHEK GUIDE) w/Device KIT 1 kit by Does not apply route 3 (three) times daily.  . chlorthalidone (HYGROTON) 25 MG tablet TAKE 1 TABLET(25 MG) BY MOUTH DAILY  . gabapentin (NEURONTIN) 100 MG capsule Take 1 capsule (100 mg total) by mouth at bedtime.  Marland Kitchen glucose blood (ACCU-CHEK GUIDE) test strip Use to check blood sugar 3 times a day. Dx code E11.65  .  Lancets (ACCU-CHEK MULTICLIX) lancets Use to check blood sugars 3 times a day. Dx code: e11.65  . metFORMIN (GLUCOPHAGE) 1000 MG tablet Take 1 tablet (1,000 mg total) by mouth 2 (two) times daily with a meal.  . Multiple Vitamin (MULTIVITAMIN) tablet Take 1 tablet by mouth daily.  . pantoprazole (PROTONIX) 40 MG tablet Take 1 tablet (40 mg total) by mouth daily.  . pravastatin (PRAVACHOL) 40 MG tablet Take 1 tablet (40 mg total) by mouth daily.  . tamsulosin (FLOMAX) 0.4 MG CAPS capsule Take 1 capsule (0.4 mg total) by mouth 2 (two) times daily.  . valsartan (DIOVAN) 160 MG tablet Take 1 tablet (160 mg total) by mouth daily.  Marland Kitchen VITAMIN D, CHOLECALCIFEROL, PO Take by mouth.   No facility-administered encounter medications on file as of 02/09/2020.    Patient Active Problem List   Diagnosis Date Noted  . DOE (dyspnea on exertion) 02/04/2020  . Diabetes mellitus with coincident hypertension ([place]) 02/04/2020  . Mixed hyperlipidemia 02/04/2020  . Palpitations 02/04/2020  . Personal history of colonic polyps 03/01/2019  . Type 2 diabetes mellitus with stage 2 chronic kidney disease, without long-term current use of insulin ([place]) 03/01/2019  . Diabetic mononeuropathy associated with type 2 diabetes mellitus ([place]) 11/24/2017  . Hypertensive nephropathy 11/24/2017  . Chronic renal disease, stage II 11/24/2017    Conditions to be addressed/monitored:DM, HTN, Vitamin D deficiency   Care Plan : Hypertension (Adult)  Updates made by Lynne Logan, RN since 02/16/2020 12:00 AM    Problem: Hypertension (Hypertension)   Priority: High    Long-Range  Goal: Hypertension Monitored   Start Date: 02/09/2020  Expected End Date: 08/08/2020  This Visit's Progress: On track  Priority: High  Note:   Current Barriers:  Marland Kitchen Knowledge Deficits related to disease process and Self Health management of Hypertension  . Chronic Disease Management support and education needs related to Hypertension and DMII Nurse Case  Manager Clinical Goal(s):  . 09/15/19 New Over the next 180 days, patient will work with the CCM team and PCP to address needs related to disease education and support for improved Self Health management of Hypertension CCM RN CM Interventions:  02/09/20 Successful call completed with patient  . Evaluation of current treatment plan related to Hypertension and patient's adherence to plan as established by provider . Re-educated patient re: target BP <130/80; Re-educated on dietary and exercise recommendations, including adherence to a low Sodium diet, rationale provided; Educated on primary hypertension disease process and potential etiology  . Determined patient is self monitoring his BP at home with reported readings have been elevated . Reviewed medications with patient and discussed patient is adhering to taking his antihypertensive medication exactly as prescribed and w/o missed doses; Current regimen:  o amLODipine (NORVASC) 2.5 MG tablet, Take 1 tablet (2.5 mg total) by mouth daily. Take 1 tablet at night for high blood pressure o valsartan (DIOVAN) 160 MG tablet, Take 1 tablet (160 mg total) by mouth daily o newly added  Chlorthalidone 25 mg oral daily, Educated on indication, dosage, frequency and potential SE to report if they occur  . Determined patient followed up with Cardiology on 02/04/20 with the following Assessment/Plan discussed and reviewed:  ASSESSMENT:     1. DOE (dyspnea on exertion)   2. Diabetes mellitus with coincident hypertension ([place])   3. Mixed hyperlipidemia   4. Palpitations     PLAN:     In order of problems listed above:   DOE concerning for angina equivalent - The patient presents with possibly cardiac DOE - not treadmill - ASCVD risk estimated at 29.6 % - Would recommend CCTA with possible FFR as needed to exclude obstructive CAD and to assess for non-obstructive CAD requiring secondary prevention - will get records from [facility] in [place]  [place]   Diabetes with hypertension - ambulatory blood pressure 120, will continue ambulatory BP monitoring; gave education on how to perform ambulatory blood pressure monitoring including the frequency and technique; goal ambulatory blood pressure < 135/85 on average - continue home medications  - discussed diet (DASH/low sodium), and exercise/weight loss interventions    Hyperlipidemia (mixed) -LDL goal less than 100 -continue current statin - will reassess post CT - gave education on dietary changes   Palpitations; possible SVT - will obtain 14-day non live heart monitor (ZioPatch)   Three months follow up unless new symptoms or abnormal test results warranting change in plan   Would be reasonable for  Video Visit Follow up Would be reasonable for  APP Follow up . Determined patient understands the recommendations determined by Dr. Gasper Sells  . Advised patient, providing education and rationale, to monitor blood pressure daily and record, calling the CCM team and or PCP for findings outside established parameters . Discussed next scheduled PCP OV with Dr. Baird Cancer scheduled for 04/05/20 _0 :47 PM  . Discussed plans with patient for ongoing care management follow up and provided patient with direct contact information for care management team Patient Self Care Activities:  . Self administers medications as prescribed without missed doses . Attend all scheduled provider appointments .  Call pharmacy for medication refills . Call provider office for new concerns or questions . Continue to monitor BP at home and record readings, notify a member of your health care team of abnormal readings . Adhere to low Sodium diet and exercise recommendations  Next Scheduled Follow Up date: 04/07/20     Plan:Telephone follow up appointment with care management team member scheduled for:  04/07/20  Barb Merino, RN, BSN, CCM Care Management Coordinator [facility]  Direct Phone: [phone]

## 2020-02-16 NOTE — Patient Instructions (Signed)
Goals Addressed    Other   .  Track and Manage My Blood Pressure-Hypertension   On track     Timeframe:  Long-Range Goal Priority:  High Start Date: 02/09/20                            Expected End Date: 08/08/20                     Follow Up Date: 04/07/20 .  Self administers medications as prescribed without missed doses . Attend all scheduled provider appointments . Call pharmacy for medication refills . Call provider office for new concerns or questions . Continue to monitor BP at home and record readings, notify a member of your health care team of abnormal readings . Adhere to low Sodium diet and exercise recommendations   Why is this important?    You won't feel high blood pressure, but it can still hurt your blood vessels.   High blood pressure can cause heart or kidney problems. It can also cause a stroke.   Making lifestyle changes like losing a Kelly Lambert weight or eating less salt will help.   Checking your blood pressure at home and at different times of the day can help to control blood pressure.   If the doctor prescribes medicine remember to take it the way the doctor ordered.   Call the office if you cannot afford the medicine or if there are questions about it.     Notes:

## 2020-02-18 ENCOUNTER — Telehealth (HOSPITAL_COMMUNITY): Payer: Self-pay | Admitting: Emergency Medicine

## 2020-02-18 ENCOUNTER — Ambulatory Visit (HOSPITAL_COMMUNITY): Payer: Medicare HMO

## 2020-02-18 NOTE — Telephone Encounter (Signed)
Reaching out to patient to offer assistance regarding upcoming cardiac imaging study; pt verbalizes understanding of appt date/time, parking situation and where to check in, pre-test NPO status and medications ordered, and verified current allergies; name and call back number provided for further questions should they arise Kelly Bond RN Navigator Cardiac Imaging [facility] [phone] office [phone] cell  Will remove zio patch the day of test.  50mg  metoprolol 2 hr prior to scan Kelly Lambert

## 2020-02-22 ENCOUNTER — Ambulatory Visit (HOSPITAL_COMMUNITY)
Admission: RE | Admit: 2020-02-22 | Discharge: 2020-02-22 | Disposition: A | Discharge status: Home / Self Care | Payer: Medicare HMO | Source: Ambulatory Visit | Attending: Internal Medicine | Admitting: Internal Medicine

## 2020-02-22 ENCOUNTER — Other Ambulatory Visit: Payer: Self-pay

## 2020-02-22 ENCOUNTER — Encounter (HOSPITAL_COMMUNITY): Payer: Self-pay

## 2020-02-22 DIAGNOSIS — E119 Type 2 diabetes mellitus without complications: Secondary | ICD-10-CM

## 2020-02-22 DIAGNOSIS — I10 Essential (primary) hypertension: Secondary | ICD-10-CM | POA: Diagnosis not present

## 2020-02-22 DIAGNOSIS — R002 Palpitations: Secondary | ICD-10-CM | POA: Insufficient documentation

## 2020-02-22 DIAGNOSIS — R0609 Other forms of dyspnea: Secondary | ICD-10-CM

## 2020-02-22 DIAGNOSIS — R0600 Dyspnea, unspecified: Secondary | ICD-10-CM | POA: Insufficient documentation

## 2020-02-22 DIAGNOSIS — I2510 Atherosclerotic heart disease of native coronary artery without angina pectoris: Secondary | ICD-10-CM | POA: Insufficient documentation

## 2020-02-22 DIAGNOSIS — E782 Mixed hyperlipidemia: Secondary | ICD-10-CM | POA: Insufficient documentation

## 2020-02-22 DIAGNOSIS — Z006 Encounter for examination for normal comparison and control in clinical research program: Secondary | ICD-10-CM

## 2020-02-22 DIAGNOSIS — R931 Abnormal findings on diagnostic imaging of heart and coronary circulation: Secondary | ICD-10-CM | POA: Insufficient documentation

## 2020-02-22 MED ORDER — IOHEXOL 350 MG/ML SOLN
80.0000 mL | Freq: Once | INTRAVENOUS | Status: AC | PRN
  Administered 2020-02-22: 80 mL via INTRAVENOUS

## 2020-02-22 MED ORDER — NITROGLYCERIN 0.4 MG SL SUBL
SUBLINGUAL_TABLET | SUBLINGUAL | Status: AC
  Filled 2020-02-22: qty 2

## 2020-02-22 MED ORDER — NITROGLYCERIN 0.4 MG SL SUBL: 0.8000 mg | SUBLINGUAL_TABLET | Freq: Once | SUBLINGUAL | Status: DC

## 2020-02-22 NOTE — Research (Signed)
IDENTIFY Informed Consent                  Subject Name: [facility]    Subject met inclusion and exclusion criteria.  The informed consent form, study requirements and expectations were reviewed with the subject and questions and concerns were addressed prior to the signing of the consent form.  The subject verbalized understanding of the trial requirements.  The subject agreed to participate in the IDENTIFY trial and signed the informed consent at 12:08PM on 02/22/20.  The informed consent was obtained prior to performance of any protocol-specific procedures for the subject.  A copy of the signed informed consent was given to the subject and a copy was placed in the subject's medical record.   Meade Maw, Naval architect

## 2020-02-23 DIAGNOSIS — I2510 Atherosclerotic heart disease of native coronary artery without angina pectoris: Secondary | ICD-10-CM | POA: Diagnosis not present

## 2020-02-25 ENCOUNTER — Encounter: Payer: Self-pay | Admitting: Internal Medicine

## 2020-02-25 DIAGNOSIS — R972 Elevated prostate specific antigen [PSA]: Secondary | ICD-10-CM | POA: Diagnosis not present

## 2020-02-29 ENCOUNTER — Other Ambulatory Visit: Payer: Self-pay | Admitting: Internal Medicine

## 2020-02-29 DIAGNOSIS — R002 Palpitations: Secondary | ICD-10-CM | POA: Diagnosis not present

## 2020-03-03 DIAGNOSIS — R3912 Poor urinary stream: Secondary | ICD-10-CM | POA: Diagnosis not present

## 2020-03-03 DIAGNOSIS — R972 Elevated prostate specific antigen [PSA]: Secondary | ICD-10-CM | POA: Diagnosis not present

## 2020-03-03 DIAGNOSIS — N401 Enlarged prostate with lower urinary tract symptoms: Secondary | ICD-10-CM | POA: Diagnosis not present

## 2020-03-08 ENCOUNTER — Other Ambulatory Visit: Payer: Self-pay | Admitting: Internal Medicine

## 2020-03-20 ENCOUNTER — Telehealth: Payer: Self-pay

## 2020-03-20 NOTE — Progress Notes (Signed)
03/20/20-The patient confirmed CCM Call Visit appointment tomorrow on Tuesday, 03/21/20 at 1:00 PM with Orlando Penner, CPP. The was made aware to have medications and supplements near during phone visit. The patient voiced understanding with no questions or concerns at this time.  Orlando Penner, CPP   Raynelle Highland, [place] Pharmacist Assistant [phone] CCM Total Time: 2 minutes

## 2020-03-21 ENCOUNTER — Ambulatory Visit (INDEPENDENT_AMBULATORY_CARE_PROVIDER_SITE_OTHER): Payer: Medicare HMO

## 2020-03-21 DIAGNOSIS — I129 Hypertensive chronic kidney disease with stage 1 through stage 4 chronic kidney disease, or unspecified chronic kidney disease: Secondary | ICD-10-CM

## 2020-03-21 DIAGNOSIS — E7800 Pure hypercholesterolemia, unspecified: Secondary | ICD-10-CM

## 2020-03-21 DIAGNOSIS — E559 Vitamin D deficiency, unspecified: Secondary | ICD-10-CM

## 2020-03-21 NOTE — Progress Notes (Signed)
Chronic Care Management Pharmacy Note  04/04/2020 Name:  Kelly Lambert MRN:  [id number] DOB:  March 13, 1950  Subjective: Kelly Lambert is an 70 y.o. year old male who is a primary patient of [name], MD.  The CCM team was consulted for assistance with disease management and care coordination needs.  Patient reports that he had some heart palpitations so he went to the cardiologist. 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   Engaged with patient by telephone for follow up visit in response to provider referral for pharmacy case management and/or care coordination services.   Consent to Services:  The patient was given information about Chronic Care Management services, agreed to services, and gave verbal consent prior to initiation of services.  Please see initial visit note for detailed documentation.   Patient Care Team: [name], MD as PCP - General (Internal Medicine) Rex Kras Claudette Stapler, RN as Case Manager Mayford Knife, [facility] (Pharmacist)  Recent office visits: 02/02/2020 PCP OV - given samples of Freestyle Libre and APP downloaded to patients phone.   Recent consult visits: 02/04/2020 Cardiology [facility] visits: None in previous 6 months  Objective:  Lab Results  Component Value Date   CREATININE 1.16 02/14/2020   BUN 16 02/14/2020   GFRNONAA 64 02/14/2020   GFRAA 74 02/14/2020   NA 137 02/14/2020   K 4.0 02/14/2020   CALCIUM 10.2 02/14/2020   CO2 24 02/14/2020    Lab Results  Component Value Date/Time   HGBA1C 7.2 (H) 12/08/2019 04:52 PM   HGBA1C 7.1 (H) 07/28/2019 02:56 PM   MICROALBUR 30 07/28/2019 02:50 PM   MICROALBUR 10 07/02/2018 12:35 PM    Last diabetic Eye exam:  Lab Results  Component Value Date/Time   HMDIABEYEEXA No Retinopathy 11/26/2019 12:00 AM    Last diabetic Foot exam: No results found for: HMDIABFOOTEX   Lab Results  Component Value Date   CHOL 154 05/05/2019   HDL 39 (L) 05/05/2019   LDLCALC 87 05/05/2019   TRIG 158  (H) 05/05/2019   CHOLHDL 3.9 05/05/2019    Hepatic Function Latest Ref Rng & Units 12/08/2019 05/05/2019 07/02/2018  Total Protein 6.0 - 8.5 g/dL 7.7 7.4 7.2  Albumin 3.8 - 4.8 g/dL 4.5 4.5 4.4  AST 0 - 40 IU/L 13 12 18   ALT 0 - 44 IU/L 12 14 13   Alk Phosphatase 44 - 121 IU/L 101 87 85  Total Bilirubin 0.0 - 1.2 mg/dL 0.3 <0.2 0.3    No results found for: TSH, FREET4  CBC Latest Ref Rng & Units 07/28/2019 07/02/2018  WBC 3.4 - 10.8 x10E3/uL 4.3 4.0  Hemoglobin 13.0 - 17.7 g/dL 13.2 13.5  Hematocrit 37.5 - 51.0 % 41.0 41.4  Platelets 150 - 450 x10E3/uL 198 206    Lab Results  Component Value Date/Time   VD25OH 23.9 (L) 07/28/2019 02:56 PM    Clinical ASCVD: No  The 10-year ASCVD risk score Mikey Bussing DC Jr., et al., 2013) is: 22.1%   Values used to calculate the score:     Age: 21 years     Sex: Male     Is Non-Hispanic African American: Yes     Diabetic: Yes     Tobacco smoker: No     Systolic Blood Pressure: 505 mmHg     Is BP treated: Yes     HDL Cholesterol: 39 mg/dL     Total Cholesterol: 154 mg/dL    Depression screen [facility] 2/9 07/28/2019 07/27/2019 05/21/2018  Decreased Interest 0 0 0  Down, Depressed, Hopeless 0 0 0  PHQ - 2 Score 0 0 0  Altered sleeping 1 - 0  Tired, decreased energy 0 - 0  Change in appetite 0 - 0  Feeling bad or failure about yourself  0 - 0  Trouble concentrating 0 - 0  Moving slowly or fidgety/restless 0 - 0  Suicidal thoughts 0 - 0  PHQ-9 Score 1 - 0  Difficult doing work/chores Not difficult at all - -    Social History   Tobacco Use  Smoking Status Former Smoker  . Packs/day: 0.50  . Years: 30.00  . Pack years: 15.00  . Types: Cigarettes  . Quit date: 1999  . Years since quitting: 23.2  Smokeless Tobacco Former Systems developer  . Types: Chew  . Quit date: 2018   BP Readings from Last 3 Encounters:  02/22/20 102/69  02/04/20 116/68  02/02/20 124/82   Pulse Readings from Last 3 Encounters:  02/22/20 70  02/04/20 75  02/02/20 86   Wt  Readings from Last 3 Encounters:  02/04/20 213 lb (96.6 kg)  02/02/20 211 lb 12.8 oz (96.1 kg)  12/08/19 213 lb 6.4 oz (96.8 kg)    Assessment/Interventions: Review of patient past medical history, allergies, medications, health status, including review of consultants reports, laboratory and other test data, was performed as part of comprehensive evaluation and provision of chronic care management services.   SDOH:  (Social Determinants of Health) assessments and interventions performed: No   CCM Care Plan  Allergies  Allergen Reactions  . Amlodipine Other (See Comments)    Headaches   . Wilder Glade [Dapagliflozin] Other (See Comments)    headaches    Medications Reviewed Today    Reviewed by Mayford Knife, [facility] (Pharmacist) on 03/21/20 at 1349  Med List Status: <None>  Medication Order Taking? Sig Documenting Provider Last Dose Status Informant  Ascorbic Acid (VITAMIN C PO) [id number] Yes Take by mouth. [provider] Taking Active   aspirin EC 81 MG tablet [id number] Yes Take 81 mg by mouth daily. [provider] Taking Active Self  Blood Glucose Calibration (ACCU-CHEK GUIDE CONTROL) Yehuda Budd [id number]  Use as directed [name], MD  Active   Blood Glucose Monitoring Suppl (ACCU-CHEK GUIDE) w/Device KIT [id number]  1 kit by Does not apply route 3 (three) times daily. [name], MD  Active   chlorthalidone (HYGROTON) 25 MG tablet [id number] Yes TAKE 1 TABLET(25 MG) BY MOUTH DAILY [name], MD Taking Active   fluticasone [facility]) 50 MCG/ACT nasal spray [id number]  Place 1 spray into both nostrils daily. [name], MD  Expired 12/08/19 2359   gabapentin (NEURONTIN) 100 MG capsule [id number] Yes Take 1 capsule (100 mg total) by mouth at bedtime. [name], MD Taking Active   glucose blood (ACCU-CHEK GUIDE) test strip [id number]  Use to check blood sugar 3 times a day. Dx code E11.65 [name], MD  Active   Lancets [facility] MULTICLIX) lancets  [id number]  Use to check blood sugars 3 times a day. Dx code: e11.65 [name], MD  Active   metFORMIN (GLUCOPHAGE) 1000 MG tablet [id number] Yes TAKE 1 TABLET TWICE DAILY WITH A MEAL [name], MD Taking Active   metoprolol tartrate (LOPRESSOR) 50 MG tablet [id number]  Take 1 tablet (50 mg total) by mouth once for 1 dose. Take 2 hours prior to Coronary CT Werner Lean, MD  Expired 02/04/20 2359   Multiple Vitamin (MULTIVITAMIN) tablet [id number] Yes Take  1 tablet by mouth daily. [provider] Taking Active Self  pantoprazole (PROTONIX) 40 MG tablet [id number] Yes Take 1 tablet (40 mg total) by mouth daily. [name], MD Taking Active   pravastatin (PRAVACHOL) 40 MG tablet [id number] Yes TAKE 1 TABLET EVERY DAY [name], MD Taking Active   tamsulosin [facility]) 0.4 MG CAPS capsule [id number] Yes Take 1 capsule (0.4 mg total) by mouth 2 (two) times daily. [name], MD Taking Active   valsartan (DIOVAN) 160 MG tablet [id number] Yes TAKE 1 TABLET EVERY DAY [name], MD Taking Active   VITAMIN D, CHOLECALCIFEROL, PO [id number]  Take by mouth. [provider]  Active           Patient Active Problem List   Diagnosis Date Noted  . DOE (dyspnea on exertion) 02/04/2020  . Diabetes mellitus with coincident hypertension ([place]) 02/04/2020  . Mixed hyperlipidemia 02/04/2020  . Palpitations 02/04/2020  . Personal history of colonic polyps 03/01/2019  . Type 2 diabetes mellitus with stage 2 chronic kidney disease, without long-term current use of insulin ([place]) 03/01/2019  . Diabetic mononeuropathy associated with type 2 diabetes mellitus ([place]) 11/24/2017  . Hypertensive nephropathy 11/24/2017  . Chronic renal disease, stage II 11/24/2017    Immunization History  Administered Date(s) Administered  . Fluad Quad(high Dose 65+) 09/18/2018  . Influenza-Unspecified 09/18/2018, 09/30/2018, 11/02/2019  . PFIZER(Purple Top)SARS-COV-2 Vaccination 02/13/2019,  03/06/2019, 10/11/2019    Conditions to be addressed/monitored:  Hypertension, Hyperlipidemia and Diabetes  Care Plan : Chronic Care Management Pharmacy  Updates made by Mayford Knife, RPH since 04/04/2020 12:00 AM    Problem: HLD, DM II, HTN   Priority: High    Goal: Disease Management   Start Date: 03/21/2020  This Visit's Progress: On track  Priority: High  Note:    Current Barriers:  . Unable to achieve control of cholesterol and diabetes.     Pharmacist Clinical Goal(s):  Marland Kitchen Patient will achieve adherence to monitoring guidelines and medication adherence to achieve therapeutic efficacy through collaboration with PharmD and provider.    Interventions: . 1:1 collaboration with [name], MD regarding development and update of comprehensive plan of care as evidenced by provider attestation and co-signature . Inter-disciplinary care team collaboration (see longitudinal plan of care) . Comprehensive medication review performed; medication list updated in electronic medical record  Hypertension (BP goal <130/80) -Controlled -Current treatment: . Chlorthalidone 25 mg tablet once per day . Valsartan 160 mg tablet once per day  -Medications previously tried:Amlodipine 2.5 mg  -Current home readings: 120/74, 120/80 -Current exercise habits:will  -Denies hypotensive/hypertensive symptoms -Educated on BP goals and benefits of medications for prevention of heart attack, stroke and kidney damage; Daily salt intake goal < 2300 mg; Importance of home blood pressure monitoring; -Counseled to monitor BP at home at least once per day, document, and provide log at future appointments -Recommended to continue current medication  Hyperlipidemia: (LDL goal < 70) -Uncontrolled -Current treatment: . Aspirin 81 mg EC take one tablet daily . Pravastatin 40 mg tablet once per day  o Will recommend that patient start Atorvastatin   -Current exercise habits: currently walking at  least 10,000 steps per day.  -Educated on Cholesterol goals;  Benefits of statin for ASCVD risk reduction; Importance of limiting foods high in cholesterol; -Counseled on diet and exercise extensively Recommended to continue current medication  Diabetes (A1c goal <7%) -Uncontrolled -Current medications: Marland Kitchen Metformin 1000 mg tablet twice per day  -Current home glucose readings - patient is  checking his BS in the morning  . fasting glucose: 140-105  -Denies hypoglycemic/hyperglycemic symptoms -Current meal patterns:  . breakfast: patient reports that he eats a pack of crackers and two pieces of toast - prior to taking medicine  . lunch: nothing, today usually an egg  . dinner: Pork Chops, rice or baked potato, started eating yams, patient reports that he is eating more broccoli, cauliflower mashed potatoes to replace the mashed potatoes, 2% milk and cheese toast, cereal . snacks: potato chips, patient is  . drinks: water and coffee only, 1- 12 ounce can of soda  -Current exercise: walking at least 10,000 steps per day -Educated on A1c and blood sugar goals; Prevention and management of hypoglycemic episodes; Carbohydrate counting and/or plate method -Counseled to check feet daily and get yearly eye exams goal to get it down 6.8 -Recommended to continue current medication   Patient Goals/Self-Care Activities . Patient will:  - take medications as prescribed focus on medication adherence by taking medication everyday.   Follow Up Plan: The patient has been provided with contact information for the care management team and has been advised to call with any health related questions or concerns.       Medication Assistance: None required.  Patient affirms current coverage meets needs.  Patient's preferred pharmacy is:  [place], [place] [address] Phone: [phone] Fax: [phone]  [id number] - [place], [place] - [address] [place] [address] Phone: [phone] Fax: [phone]  Uses pill box? Yes Pt endorses 100% compliance  We discussed: Current pharmacy is preferred with insurance plan and patient is satisfied with pharmacy services Patient decided to: Continue current medication management strategy  Care Plan and Follow Up Patient Decision:  Patient agrees to Care Plan and Follow-up.  Plan: Telephone follow up appointment with care management team member scheduled for:  06/21/2020 and The patient has been provided with contact information for the care management team and has been advised to call with any health related questions or concerns.   Orlando Penner, PharmD Clinical Pharmacist [facility] [phone]

## 2020-04-04 NOTE — Patient Instructions (Signed)
Visit Information It was great speaking with you today!  Please let me know if you have any questions about our visit.  Goals Addressed            This Visit's Progress   . Manage My Medicine       Timeframe:  Long-Range Goal Priority:  High Start Date:                             Expected End Date:                       Follow Up Date 07/12/2020    - call for medicine refill 2 or 3 days before it runs out - call if I am sick and can't take my medicine - keep a list of all the medicines I take; vitamins and herbals too - learn to read medicine labels - use a pillbox to sort medicine - use an alarm clock or phone to remind me to take my medicine    Why is this important?   . These steps will help you keep on track with your medicines.          Patient Care Plan: Hypertension (Adult)    Problem Identified: Hypertension (Hypertension)   Priority: High    Long-Range Goal: Hypertension Monitored   Start Date: 02/09/2020  Expected End Date: 08/08/2020  This Visit's Progress: On track  Priority: High  Note:   Current Barriers:  Marland Kitchen Knowledge Deficits related to disease process and Self Health management of Hypertension  . Chronic Disease Management support and education needs related to Hypertension and DMII Nurse Case Manager Clinical Goal(s):  . 09/15/19 New Over the next 180 days, patient will work with the CCM team and PCP to address needs related to disease education and support for improved Self Health management of Hypertension CCM RN CM Interventions:  02/09/20 Successful call completed with patient  . Evaluation of current treatment plan related to Hypertension and patient's adherence to plan as established by provider . Re-educated patient re: target BP <130/80; Re-educated on dietary and exercise recommendations, including adherence to a low Sodium diet, rationale provided; Educated on primary hypertension disease process and potential etiology  . Determined patient is  self monitoring his BP at home with reported readings have been elevated . Reviewed medications with patient and discussed patient is adhering to taking his antihypertensive medication exactly as prescribed and w/o missed doses; Current regimen:  o amLODipine (NORVASC) 2.5 MG tablet, Take 1 tablet (2.5 mg total) by mouth daily. Take 1 tablet at night for high blood pressure o valsartan (DIOVAN) 160 MG tablet, Take 1 tablet (160 mg total) by mouth daily o newly added  Chlorthalidone 25 mg oral daily, Educated on indication, dosage, frequency and potential SE to report if they occur  . Determined patient followed up with Cardiology on 02/04/20 with the following Assessment/Plan discussed and reviewed:  ASSESSMENT:     1. DOE (dyspnea on exertion)   2. Diabetes mellitus with coincident hypertension ([place])   3. Mixed hyperlipidemia   4. Palpitations     PLAN:     In order of problems listed above:   DOE concerning for angina equivalent - The patient presents with possibly cardiac DOE - not treadmill - ASCVD risk estimated at 29.6 % - Would recommend CCTA with possible FFR as needed to exclude obstructive CAD and to assess for non-obstructive CAD  requiring secondary prevention - will get records from [facility] [place]   Diabetes with hypertension - ambulatory blood pressure 120, will continue ambulatory BP monitoring; gave education on how to perform ambulatory blood pressure monitoring including the frequency and technique; goal ambulatory blood pressure < 135/85 on average - continue home medications  - discussed diet (DASH/low sodium), and exercise/weight loss interventions    Hyperlipidemia (mixed) -LDL goal less than 100 -continue current statin - will reassess post CT - gave education on dietary changes   Palpitations; possible SVT - will obtain 14-day non live heart monitor (ZioPatch)   Three months follow up unless new symptoms or abnormal test results  warranting change in plan   Would be reasonable for  Video Visit Follow up Would be reasonable for  APP Follow up . Determined patient understands the recommendations determined by Dr. Gasper Sells  . Advised patient, providing education and rationale, to monitor blood pressure daily and record, calling the CCM team and or PCP for findings outside established parameters . Discussed next scheduled PCP OV with Dr. Baird Cancer scheduled for 04/05/20 @3 :38 PM  . Discussed plans with patient for ongoing care management follow up and provided patient with direct contact information for care management team Patient Self Care Activities:  . Self administers medications as prescribed without missed doses . Attend all scheduled provider appointments . Call pharmacy for medication refills . Call provider office for new concerns or questions . Continue to monitor BP at home and record readings, notify a member of your health care team of abnormal readings . Adhere to low Sodium diet and exercise recommendations  Next Scheduled Follow Up date: 04/07/20   Patient Care Plan: Chronic Care Management Pharmacy    Problem Identified: HLD, DM II, HTN   Priority: High    Goal: Disease Management   Start Date: 03/21/2020  This Visit's Progress: On track  Priority: High  Note:    Current Barriers:  . Unable to achieve control of cholesterol and diabetes.     Pharmacist Clinical Goal(s):  Marland Kitchen Patient will achieve adherence to monitoring guidelines and medication adherence to achieve therapeutic efficacy through collaboration with PharmD and provider.    Interventions: . 1:1 collaboration with [name], MD regarding development and update of comprehensive plan of care as evidenced by provider attestation and co-signature . Inter-disciplinary care team collaboration (see longitudinal plan of care) . Comprehensive medication review performed; medication list updated in electronic medical  record  Hypertension (BP goal <130/80) -Controlled -Current treatment: . Chlorthalidone 25 mg tablet once per day . Valsartan 160 mg tablet once per day  -Medications previously tried:Amlodipine 2.5 mg  -Current home readings: 120/74, 120/80 -Current exercise habits:will  -Denies hypotensive/hypertensive symptoms -Educated on BP goals and benefits of medications for prevention of heart attack, stroke and kidney damage; Daily salt intake goal < 2300 mg; Importance of home blood pressure monitoring; -Counseled to monitor BP at home at least once per day, document, and provide log at future appointments -Recommended to continue current medication  Hyperlipidemia: (LDL goal < 70) -Uncontrolled -Current treatment: . Aspirin 81 mg EC take one tablet daily . Pravastatin 40 mg tablet once per day  o Will recommend that patient start Atorvastatin   -Current exercise habits: currently walking at least 10,000 steps per day.  -Educated on Cholesterol goals;  Benefits of statin for ASCVD risk reduction; Importance of limiting foods high in cholesterol; -Counseled on diet and exercise extensively Recommended to continue current medication  Diabetes (A1c goal <7%) -Uncontrolled -Current medications: Marland Kitchen Metformin 1000 mg tablet twice per day  -Current home glucose readings - patient is checking his BS in the morning  . fasting glucose: 140-105  -Denies hypoglycemic/hyperglycemic symptoms -Current meal patterns:  . breakfast: patient reports that he eats a pack of crackers and two pieces of toast - prior to taking medicine  . lunch: nothing, today usually an egg  . dinner: Pork Chops, rice or baked potato, started eating yams, patient reports that he is eating more broccoli, cauliflower mashed potatoes to replace the mashed potatoes, 2% milk and cheese toast, cereal . snacks: potato chips, patient is  . drinks: water and coffee only, 1- 12 ounce can of soda  -Current exercise: walking at  least 10,000 steps per day -Educated on A1c and blood sugar goals; Prevention and management of hypoglycemic episodes; Carbohydrate counting and/or plate method -Counseled to check feet daily and get yearly eye exams goal to get it down 6.8 -Recommended to continue current medication   Patient Goals/Self-Care Activities . Patient will:  - take medications as prescribed focus on medication adherence by taking medication everyday.   Follow Up Plan: The patient has been provided with contact information for the care management team and has been advised to call with any health related questions or concerns.       Patient agreed to services and verbal consent obtained.   The patient verbalized understanding of instructions, educational materials, and care plan provided today and declined offer to receive copy of patient instructions, educational materials, and care plan.   Orlando Penner, PharmD Clinical Pharmacist [facility] [phone]

## 2020-04-05 ENCOUNTER — Other Ambulatory Visit: Payer: Self-pay

## 2020-04-05 ENCOUNTER — Encounter: Payer: Self-pay | Admitting: Internal Medicine

## 2020-04-05 ENCOUNTER — Ambulatory Visit (INDEPENDENT_AMBULATORY_CARE_PROVIDER_SITE_OTHER): Payer: Medicare HMO | Admitting: Internal Medicine

## 2020-04-05 VITALS — BP 118/80 | HR 67 | Temp 98.0°F | Ht 70.0 in | Wt 211.8 lb

## 2020-04-05 DIAGNOSIS — E6609 Other obesity due to excess calories: Secondary | ICD-10-CM | POA: Diagnosis not present

## 2020-04-05 DIAGNOSIS — I129 Hypertensive chronic kidney disease with stage 1 through stage 4 chronic kidney disease, or unspecified chronic kidney disease: Secondary | ICD-10-CM

## 2020-04-05 DIAGNOSIS — Z23 Encounter for immunization: Secondary | ICD-10-CM | POA: Diagnosis not present

## 2020-04-05 DIAGNOSIS — E66811 Obesity, class 1: Secondary | ICD-10-CM

## 2020-04-05 DIAGNOSIS — E1122 Type 2 diabetes mellitus with diabetic chronic kidney disease: Secondary | ICD-10-CM | POA: Diagnosis not present

## 2020-04-05 DIAGNOSIS — N182 Chronic kidney disease, stage 2 (mild): Secondary | ICD-10-CM

## 2020-04-05 DIAGNOSIS — Z6830 Body mass index (BMI) 30.0-30.9, adult: Secondary | ICD-10-CM

## 2020-04-05 DIAGNOSIS — Z860100 Personal history of colon polyps, unspecified: Secondary | ICD-10-CM

## 2020-04-05 NOTE — Patient Instructions (Signed)

## 2020-04-05 NOTE — Progress Notes (Signed)
Earleen Newport as a Education administrator for Maximino Greenland, MD.,have documented all relevant documentation on the behalf of Maximino Greenland, MD,as directed by  Maximino Greenland, MD while in the presence of Maximino Greenland, MD. This visit occurred during the SARS-CoV-2 public health emergency.  Safety protocols were in place, including screening questions prior to the visit, additional usage of staff PPE, and extensive cleaning of exam room while observing appropriate contact time as indicated for disinfecting solutions.  Subjective:     Patient ID: Kelly Lambert , male    DOB: [birth year] , 70 y.o.   MRN: [id number]   Chief Complaint  Patient presents with  . Hypertension  . Diabetes    HPI  Patient here for a blood pressure and diabetes f/u. She reports compliance with meds. He denies headaches, chest pain and palpitations.   Hypertension This is a chronic problem. The current episode started more than 1 year ago. The problem has been gradually improving since onset. The problem is controlled. Pertinent negatives include no blurred vision, chest pain, palpitations or shortness of breath. Risk factors for coronary artery disease include diabetes mellitus, dyslipidemia and male gender. Past treatments include angiotensin blockers and diuretics. The current treatment provides moderate improvement. Compliance problems include exercise.  Hypertensive end-organ damage includes kidney disease.  Diabetes He presents for his follow-up diabetic visit. He has type 2 diabetes mellitus. Pertinent negatives for diabetes include no blurred vision and no chest pain. There are no hypoglycemic complications. Diabetic complications include nephropathy. Risk factors for coronary artery disease include diabetes mellitus, dyslipidemia, hypertension and male sex. He is compliant with treatment most of the time. He is following a diabetic diet. Meal planning includes avoidance of concentrated sweets. He participates in  exercise intermittently. His home blood glucose trend is fluctuating minimally. His breakfast blood glucose is taken between 9-10 am. His breakfast blood glucose range is generally 110-130 mg/dl. An ACE inhibitor/angiotensin II receptor blocker is being taken. Eye exam is current.     Past Medical History:  Diagnosis Date  . BPH (benign prostatic hyperplasia)   . Diabetes mellitus without complication ([place])   . Hyperlipidemia associated with type 2 diabetes mellitus ([place])   . Hypertension      Family History  Problem Relation Age of Onset  . Thyroid cancer Mother   . Cancer Father   . Cancer Brother      Current Outpatient Medications:  .  Ascorbic Acid (VITAMIN C PO), Take by mouth., Disp: , Rfl:  .  aspirin EC 81 MG tablet, Take 81 mg by mouth daily., Disp: , Rfl:  .  Blood Glucose Calibration (ACCU-CHEK GUIDE CONTROL) LIQD, Use as directed, Disp: 1 each, Rfl: 1 .  Blood Glucose Monitoring Suppl (ACCU-CHEK GUIDE) w/Device KIT, 1 kit by Does not apply route 3 (three) times daily., Disp: 1 kit, Rfl: 3 .  chlorthalidone (HYGROTON) 25 MG tablet, TAKE 1 TABLET(25 MG) BY MOUTH DAILY, Disp: 90 tablet, Rfl: 1 .  gabapentin (NEURONTIN) 100 MG capsule, Take 1 capsule (100 mg total) by mouth at bedtime., Disp: 90 capsule, Rfl: 2 .  glucose blood (ACCU-CHEK GUIDE) test strip, Use to check blood sugar 3 times a day. Dx code E11.65, Disp: 300 each, Rfl: 2 .  Lancets (ACCU-CHEK MULTICLIX) lancets, Use to check blood sugars 3 times a day. Dx code: e11.65, Disp: 300 each, Rfl: 2 .  metFORMIN (GLUCOPHAGE) 1000 MG tablet, TAKE 1 TABLET TWICE DAILY WITH A MEAL, Disp: 180 tablet, Rfl:  2 .  Multiple Vitamin (MULTIVITAMIN) tablet, Take 1 tablet by mouth daily., Disp: , Rfl:  .  pantoprazole (PROTONIX) 40 MG tablet, Take 1 tablet (40 mg total) by mouth daily., Disp: 90 tablet, Rfl: 2 .  pravastatin (PRAVACHOL) 40 MG tablet, TAKE 1 TABLET EVERY DAY, Disp: 90 tablet, Rfl: 2 .  tamsulosin (FLOMAX) 0.4 MG CAPS  capsule, Take 1 capsule (0.4 mg total) by mouth 2 (two) times daily., Disp: 160 capsule, Rfl: 2 .  valsartan (DIOVAN) 160 MG tablet, TAKE 1 TABLET EVERY DAY, Disp: 90 tablet, Rfl: 2 .  VITAMIN D, CHOLECALCIFEROL, PO, Take by mouth., Disp: , Rfl:  .  fluticasone (FLONASE) 50 MCG/ACT nasal spray, Place 1 spray into both nostrils daily., Disp: 16 g, Rfl: 2 .  metoprolol tartrate (LOPRESSOR) 50 MG tablet, Take 1 tablet (50 mg total) by mouth once for 1 dose. Take 2 hours prior to Coronary CT, Disp: 1 tablet, Rfl: 0   Allergies  Allergen Reactions  . Amlodipine Other (See Comments)    Headaches   . Wilder Glade [Dapagliflozin] Other (See Comments)    headaches     Review of Systems  Constitutional: Negative.   HENT: Negative.   Eyes: Negative.  Negative for blurred vision.  Respiratory: Negative.  Negative for shortness of breath.   Cardiovascular: Negative.  Negative for chest pain and palpitations.  Gastrointestinal: Negative.   Neurological: Negative.   Psychiatric/Behavioral: Negative.      Today's Vitals   04/05/20 1533  BP: 118/80  Pulse: 67  Temp: 98 F (36.7 C)  SpO2: 99%  Weight: 211 lb 12.8 oz (96.1 kg)  Height: 5' 10" (1.778 m)  PainSc: 0-No pain   Body mass index is 30.39 kg/m.  Wt Readings from Last 3 Encounters:  04/05/20 211 lb 12.8 oz (96.1 kg)  02/04/20 213 lb (96.6 kg)  02/02/20 211 lb 12.8 oz (96.1 kg)   Objective:  Physical Exam Vitals and nursing note reviewed. Exam conducted with a chaperone present.  Constitutional:      Appearance: Normal appearance.  HENT:     Head: Normocephalic and atraumatic.     Nose:     Comments: Masked     Mouth/Throat:     Comments: Masked  Cardiovascular:     Rate and Rhythm: Normal rate and regular rhythm.     Heart sounds: Normal heart sounds.  Pulmonary:     Effort: Pulmonary effort is normal.     Breath sounds: Normal breath sounds.  Musculoskeletal:     Cervical back: Normal range of motion.  Skin:     General: Skin is warm.  Neurological:     General: No focal deficit present.     Mental Status: He is alert.  Psychiatric:        Mood and Affect: Mood normal.         Assessment And Plan:     1. Hypertensive nephropathy Comments: Chronic, well controlled. He will c/w current meds. He is encouraged to follow low sodium diet.   2. Type 2 diabetes mellitus with stage 2 chronic kidney disease, without long-term current use of insulin (HCC) Comments: Chronic, I will check labs as listed below. I will check renal function. He will f/u in 4 months for re-evaluation.  - Hemoglobin A1c  3. Class 1 obesity due to excess calories without serious comorbidity with body mass index (BMI) of 30.0 to 30.9 in adult Comments: He has lost 2 pounds since his last visit. He is  encouraged to aim for at least 150 minutes of exercise per week.   4. Personal history of colonic polyps Comments: Last colonoscopy was in 2021, due in 2025.   5. Flu vaccine need - Flu Vaccine QUAD High Dose(Fluad)     Patient was given opportunity to ask questions. Patient verbalized understanding of the plan and was able to repeat key elements of the plan. All questions were answered to their satisfaction.   I, Maximino Greenland, MD, have reviewed all documentation for this visit. The documentation on 04/05/20 for the exam, diagnosis, procedures, and orders are all accurate and complete.   IF YOU HAVE BEEN REFERRED TO A SPECIALIST, IT MAY TAKE 1-2 WEEKS TO SCHEDULE/PROCESS THE REFERRAL. IF YOU HAVE NOT HEARD FROM US/SPECIALIST IN TWO WEEKS, PLEASE GIVE [place] A CALL AT [phone].   THE PATIENT IS ENCOURAGED TO PRACTICE SOCIAL DISTANCING DUE TO THE COVID-19 PANDEMIC.

## 2020-04-06 LAB — HEMOGLOBIN A1C
Est. average glucose Bld gHb Est-mCnc: 174 mg/dL
Hgb A1c MFr Bld: 7.7 % — ABNORMAL HIGH (ref 4.8–5.6)

## 2020-04-07 ENCOUNTER — Telehealth: Payer: Medicare HMO

## 2020-04-24 ENCOUNTER — Telehealth: Payer: Self-pay

## 2020-04-24 ENCOUNTER — Encounter: Payer: Self-pay | Admitting: Internal Medicine

## 2020-04-24 DIAGNOSIS — E6609 Other obesity due to excess calories: Secondary | ICD-10-CM | POA: Insufficient documentation

## 2020-04-24 NOTE — Chronic Care Management (AMB) (Signed)
Chronic Care Management Pharmacy Assistant   Name: Kelly Lambert  MRN: [id number] DOB: 1950/02/23 .  Reason for Encounter: Disease State-DM     Recent office visits:  04/05/20-Robyn Sanders,MD(PCP)  Recent consult visits:  None noted  Hospital visits:  None in previous 6 months  Medications: Outpatient Encounter Medications as of 04/24/2020  Medication Sig  . Ascorbic Acid (VITAMIN C PO) Take by mouth.  Marland Kitchen aspirin EC 81 MG tablet Take 81 mg by mouth daily.  . Blood Glucose Calibration (ACCU-CHEK GUIDE CONTROL) LIQD Use as directed  . Blood Glucose Monitoring Suppl (ACCU-CHEK GUIDE) w/Device KIT 1 kit by Does not apply route 3 (three) times daily.  . chlorthalidone (HYGROTON) 25 MG tablet TAKE 1 TABLET(25 MG) BY MOUTH DAILY  . fluticasone (FLONASE) 50 MCG/ACT nasal spray Place 1 spray into both nostrils daily.  Marland Kitchen gabapentin (NEURONTIN) 100 MG capsule Take 1 capsule (100 mg total) by mouth at bedtime.  Marland Kitchen glucose blood (ACCU-CHEK GUIDE) test strip Use to check blood sugar 3 times a day. Dx code E11.65  . Lancets (ACCU-CHEK MULTICLIX) lancets Use to check blood sugars 3 times a day. Dx code: e11.65  . metFORMIN (GLUCOPHAGE) 1000 MG tablet TAKE 1 TABLET TWICE DAILY WITH A MEAL  . metoprolol tartrate (LOPRESSOR) 50 MG tablet Take 1 tablet (50 mg total) by mouth once for 1 dose. Take 2 hours prior to Coronary CT  . Multiple Vitamin (MULTIVITAMIN) tablet Take 1 tablet by mouth daily.  . pantoprazole (PROTONIX) 40 MG tablet Take 1 tablet (40 mg total) by mouth daily.  . pravastatin (PRAVACHOL) 40 MG tablet TAKE 1 TABLET EVERY DAY  . tamsulosin (FLOMAX) 0.4 MG CAPS capsule Take 1 capsule (0.4 mg total) by mouth 2 (two) times daily.  . valsartan (DIOVAN) 160 MG tablet TAKE 1 TABLET EVERY DAY  . VITAMIN D, CHOLECALCIFEROL, PO Take by mouth.   No facility-administered encounter medications on file as of 04/24/2020.     Recent Relevant Labs: Lab Results  Component Value Date/Time    HGBA1C 7.7 (H) 04/05/2020 04:10 PM   HGBA1C 7.2 (H) 12/08/2019 04:52 PM   MICROALBUR 30 07/28/2019 02:50 PM   MICROALBUR 10 07/02/2018 12:35 PM    Kidney Function Lab Results  Component Value Date/Time   CREATININE 1.16 02/14/2020 09:26 AM   CREATININE 1.03 12/08/2019 04:52 PM   MNOTRRNH 65 02/14/2020 09:26 AM   GFRAA 74 02/14/2020 09:26 AM    . Current antihyperglycemic regimen:   Metformin 1000 mg Take 1 tablet twice a day daily   . What recent interventions/DTPs have been made to improve glycemic control:  o none  . Have there been any recent hospitalizations or ED visits since last visit with CPP? No   . Patient reports hypoglycemic symptoms, including Hungry   . Patient denies hyperglycemic symptoms, including blurry vision, excessive thirst, fatigue, polyuria and weakness   . How often are you checking your blood sugar? twice daily   . What are your blood sugars ranging?  o Fasting: under 130 o Before meals: na o After meals: Noon 90-115 o Bedtime: na  . During the week, how often does your blood glucose drop below 70? Never   . Are you checking your feet daily/regularly? Patient states he is not checking his feet daily, advised patient to start checking daily for any scrapes, cuts or changes.  Adherence Review: Is the patient currently on a STATIN medication? Yes Is the patient currently on ACE/ARB medication? Yes Does  the patient have >5 day gap between last estimated fill dates? No  Called patients insurance for eligibility on [employer]. Insurance covers with a 20% co-insurance. Informed patient of this. Informed patient to call [facility] to set up account per insurance. Patient states he will call and set this up and he is interested in receiving the freestyle libre.  [facility] [place], [place]   Patient states he has cut out eating starches and bread.   Star Rating Drugs: Metformin 1000 mg Take 1 tablet twice a  day daily last filled 03/05/20 90DS Pravastatin 40 mg Take 1 tablet in the evening last filled 03/05/20 90DS Valsartan 160 mg Take 1 tablet every evening last filled 03/13/20 90DS   [facility] Pharmacist Assistant [phone]

## 2020-04-26 ENCOUNTER — Other Ambulatory Visit: Payer: Self-pay

## 2020-04-26 MED ORDER — CHLORTHALIDONE 25 MG PO TABS: ORAL_TABLET | ORAL | 2 refills | Status: DC

## 2020-05-04 ENCOUNTER — Telehealth: Payer: Medicare HMO

## 2020-05-19 ENCOUNTER — Telehealth: Payer: Self-pay

## 2020-05-19 NOTE — Telephone Encounter (Signed)
Spoke with pt asking if he wanted to try [facility]. Pt stated he is currently satisfied with the blood glucose monitor he currently has.

## 2020-05-24 ENCOUNTER — Other Ambulatory Visit: Payer: Self-pay

## 2020-05-24 ENCOUNTER — Other Ambulatory Visit: Payer: Medicare HMO

## 2020-05-24 DIAGNOSIS — Z79899 Other long term (current) drug therapy: Secondary | ICD-10-CM

## 2020-05-25 ENCOUNTER — Ambulatory Visit: Payer: Medicare HMO | Admitting: Internal Medicine

## 2020-05-25 ENCOUNTER — Encounter: Payer: Self-pay | Admitting: Internal Medicine

## 2020-05-25 VITALS — BP 120/78 | HR 67 | Ht 71.0 in | Wt 206.0 lb

## 2020-05-25 DIAGNOSIS — I2510 Atherosclerotic heart disease of native coronary artery without angina pectoris: Secondary | ICD-10-CM | POA: Diagnosis not present

## 2020-05-25 DIAGNOSIS — E782 Mixed hyperlipidemia: Secondary | ICD-10-CM

## 2020-05-25 DIAGNOSIS — I471 Supraventricular tachycardia: Secondary | ICD-10-CM | POA: Diagnosis not present

## 2020-05-25 DIAGNOSIS — I10 Essential (primary) hypertension: Secondary | ICD-10-CM

## 2020-05-25 DIAGNOSIS — E119 Type 2 diabetes mellitus without complications: Secondary | ICD-10-CM

## 2020-05-25 LAB — BMP8+EGFR
BUN/Creatinine Ratio: 15 (ref 10–24)
BUN: 18 mg/dL (ref 8–27)
CO2: 23 mmol/L (ref 20–29)
Calcium: 10.1 mg/dL (ref 8.6–10.2)
Chloride: 100 mmol/L (ref 96–106)
Creatinine, Ser: 1.21 mg/dL (ref 0.76–1.27)
Glucose: 107 mg/dL — ABNORMAL HIGH (ref 65–99)
Potassium: 4.2 mmol/L (ref 3.5–5.2)
Sodium: 139 mmol/L (ref 134–144)
eGFR: 65 mL/min/{1.73_m2} (ref >59)

## 2020-05-25 MED ORDER — PRAVASTATIN SODIUM 80 MG PO TABS: 80.0000 mg | ORAL_TABLET | Freq: Every evening | ORAL | 3 refills | Status: DC

## 2020-05-25 NOTE — Progress Notes (Signed)
Cardiology Office Note:    Date:  05/25/2020   ID:  Kelly Lambert, DOB 1950-12-24, MRN [id number]  PCP:  [name], MD  [facility] Cardiologist:  Werner Lean, MD  [facility] Electrophysiologist:  None   Referring MD: [name], MD   CC: DOE- follow up  History of Present Illness:    Kelly Lambert is a 70 y.o. male with a hx of DM with HTN, HLD who presented for evaluation 02/04/20. In interim of this visit, patient had [place]iop[place] and CCTA.  Patient notes that he is doing well.  Since last visit notes doing some maintenance on her daughter's car.  Going to the [facility] and riding a bike without symptoms.  Relevant interval testing or therapy include CCTA.  There are no interval hospital/ED visit.    No chest pain or pressure .  No SOB/DOE and no PND/Orthopnea.  No weight gain or leg swelling.  No palpitations or syncope.  Ambulatory blood pressure 120/74 on average.   Past Medical History:  Diagnosis Date  . BPH (benign prostatic hyperplasia)   . Diabetes mellitus without complication ([place])   . Hyperlipidemia associated with type 2 diabetes mellitus ([place])   . Hypertension     Past Surgical History:  Procedure Laterality Date  . JOINT REPLACEMENT Bilateral 2011   knee    Current Medications: Current Meds  Medication Sig  . Ascorbic Acid (VITAMIN C PO) Take by mouth.  Marland Kitchen aspirin EC 81 MG tablet Take 81 mg by mouth daily.  . Blood Glucose Calibration (ACCU-CHEK GUIDE CONTROL) LIQD Use as directed  . Blood Glucose Monitoring Suppl (ACCU-CHEK GUIDE) w/Device KIT 1 kit by Does not apply route 3 (three) times daily.  . chlorthalidone (HYGROTON) 25 MG tablet TAKE 1 TABLET(25 MG) BY MOUTH DAILY  . fluticasone (FLONASE) 50 MCG/ACT nasal spray Place 1 spray into both nostrils daily.  Marland Kitchen gabapentin (NEURONTIN) 100 MG capsule Take 1 capsule (100 mg total) by mouth at bedtime.  Marland Kitchen glucose blood (ACCU-CHEK GUIDE) test strip Use to check blood sugar 3 times a  day. Dx code E11.65  . Lancets (ACCU-CHEK MULTICLIX) lancets Use to check blood sugars 3 times a day. Dx code: e11.65  . metFORMIN (GLUCOPHAGE) 1000 MG tablet TAKE 1 TABLET TWICE DAILY WITH A MEAL  . Multiple Vitamin (MULTIVITAMIN) tablet Take 1 tablet by mouth daily.  . pantoprazole (PROTONIX) 40 MG tablet Take 1 tablet (40 mg total) by mouth daily.  . pravastatin (PRAVACHOL) 80 MG tablet Take 1 tablet (80 mg total) by mouth every evening.  . tamsulosin (FLOMAX) 0.4 MG CAPS capsule Take 1 capsule (0.4 mg total) by mouth 2 (two) times daily.  . valsartan (DIOVAN) 160 MG tablet TAKE 1 TABLET EVERY DAY  . VITAMIN D, CHOLECALCIFEROL, PO Take by mouth.  . [DISCONTINUED] pravastatin (PRAVACHOL) 40 MG tablet TAKE 1 TABLET EVERY DAY     Allergies:   Amlodipine and Farxiga [dapagliflozin]   Social History   Socioeconomic History  . Marital status: Married    Spouse name: Not on file  . Number of children: 3  . Years of education: Not on file  . Highest education level: Not on file  Occupational History  . Occupation: part time work  Tobacco Use  . Smoking status: Former Smoker    Packs/day: 1.00    Years: 25.00    Pack years: 25.00    Types: Cigarettes    Quit date: 1999    Years since quitting: 23.3  .  Smokeless tobacco: Former Systems developer    Types: Sarina Ser    Quit date: 2018  Vaping Use  . Vaping Use: Never used  Substance and Sexual Activity  . Alcohol use: Not Currently  . Drug use: Not Currently  . Sexual activity: Yes  Other Topics Concern  . Not on file  Social History Narrative   Patient reports he works 7 days per week; currently out of work at his M-F job at [employer] due to [employer] pandemic. 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 Patient participates in daily exercise walking 7-8 miles per day.   Social Determinants of Health   Financial Resource Strain: Low Risk   . Difficulty of Paying Living Expenses: Not hard at all  Food  Insecurity: No Food Insecurity  . Worried About Charity fundraiser in the Last Year: Never true  . Ran Out of Food in the Last Year: Never true  Transportation Needs: No Transportation Needs  . Lack of Transportation (Medical): No  . Lack of Transportation (Non-Medical): No  Physical Activity: Inactive  . Days of Exercise per Week: 0 days  . Minutes of Exercise per Session: 0 min  Stress: No Stress Concern Present  . Feeling of Stress : Not at all  Social Connections: Not on file     Family History: The patient's family history includes Cancer in his brother and father; Thyroid cancer in his mother. History of coronary artery disease notable for father. History of heart failure notable for no members. History of arrhythmia notable for no members.  ROS:   Please see the history of present illness.    All other systems reviewed and are negative.  EKGs/Labs/Other Studies Reviewed:    The following studies were reviewed today:  EKG:   02/04/19:  SR Rate 75 WNL  Cardiac Event Monitoring: Date: 03/01/20 Results:  Patient had a minimum heart rate of 51 bpm, maximum heart rate of 218 bpm, and average heart rate of 84 bpm.  Predominant underlying rhythm was sinus rhythm.  One run of non-sustained ventricular tachycardia occurred lasting 4 beats at longest with a max rate of 174 bpm at fastest.  627 runs of supraventricular tachycardia (SVT) occurred lasting 15 beats at longest with a max rate of 218 bpm at fastest.  Isolated PACs were frequent (7.4%), with occasional couplets (1.7%) and rare (<1.0%) triplets present.  Isolated PVCs were rare (<1.0%), with rare couplets or triplets present.  No evidence of complete heart block .  No triggered and diary events.   Asymptomatic SVT.   Cardiac CT: Date:02/22/20 Results: IMPRESSION: 1. Coronary calcium score of 94. This was 68th percentile for age, sex, and race matched control.  2. Normal coronary origin with  co-dominance.  3. Atypical pulmonary vein drainage into the left atrium: left upper and lower pulmonary veins merge to a common vessel prior to entering left atrium.  4. CAD-RADS 2. Mild non-obstructive CAD (25-49%). Consider non-atherosclerotic causes of chest pain. Consider preventive therapy and risk factor modification. CT-FFR being sent for RCA evaluation  IMPRESSION: 1. CT FFR analysis shows no evidence of significant functional Stenosis.   IMPRESSION: No acute or significant extracardiac abnormality.   Recent Labs: 07/28/2019: Hemoglobin 13.2; Platelets 198 12/08/2019: ALT 12 05/24/2020: BUN 18; Creatinine, Ser 1.21; Potassium 4.2; Sodium 139  Recent Lipid Panel    Component Value Date/Time   CHOL 154 05/05/2019 1153   TRIG 158 (H) 05/05/2019 1153   HDL  39 (L) 05/05/2019 1153   CHOLHDL 3.9 05/05/2019 1153   LDLCALC 87 05/05/2019 1153    Risk Assessment/Calculations:     The 10-year ASCVD risk score Mikey Bussing DC Brooke Bonito., et al., 2013) is: 28.9%   Values used to calculate the score:     Age: 64 years     Sex: Male     Is Non-Hispanic African American: Yes     Diabetic: Yes     Tobacco smoker: No     Systolic Blood Pressure: 726 mmHg     Is BP treated: Yes     HDL Cholesterol: 39 mg/dL     Total Cholesterol: 154 mg/dL   Physical Exam:    VS:  BP 120/78   Pulse 67   Ht _0  (1.803 m)   Wt 93.4 kg   SpO2 95%   BMI 28.73 kg/m     Wt Readings from Last 3 Encounters:  05/25/20 93.4 kg  04/05/20 96.1 kg  02/04/20 96.6 kg    GEN:  Well nourished, well developed in no acute distress HEENT: Normal NECK: No JVD LYMPHATICS: No lymphadenopathy CARDIAC: RRR, no murmurs, rubs, gallops RESPIRATORY:  Clear to auscultation without rales, wheezing or rhonchi  ABDOMEN: Soft, non-tender, non-distended MUSCULOSKELETAL:  No edema; No deformity  SKIN: Warm and dry NEUROLOGIC:  Alert and oriented x 3 PSYCHIATRIC:  Normal affect   ASSESSMENT:    1. PSVT (paroxysmal  supraventricular tachycardia) ([place])   2. Mixed hyperlipidemia   3. Diabetes mellitus with coincident hypertension ([place])   4. Coronary artery disease involving native coronary artery of native heart without angina pectoris    PLAN:    In order of problems listed above:  Mild non-obstructive CAD Hypertension with Diabetes Hyperlipidemia (mixed) P-SVT - Unable to get [facility] records [place] - ambulatory blood pressure at goal, will continue ambulatory BP monitoring - will increase pravastatin to 80 mg PO Daily and repeat lipids and LFTs in three months - asymptomatic - reviewed imaging with patient - continue ASA 81 mg - taking BB off of chart - Valsartan 160 mg PO CTZ 25 mg - discussed backing of caffeine and dietary changes  One year follow up unless new symptoms or abnormal test results warranting change in plan  Would be reasonable for  APP Follow up        Medication Adjustments/Labs and Tests Ordered: Current medicines are reviewed at length with the patient today.  Concerns regarding medicines are outlined above.  Orders Placed This Encounter  Procedures  . Lipid panel  . Hepatic function panel  . ECHOCARDIOGRAM COMPLETE   Meds ordered this encounter  Medications  . pravastatin (PRAVACHOL) 80 MG tablet    Sig: Take 1 tablet (80 mg total) by mouth every evening.    Dispense:  90 tablet    Refill:  3    Patient Instructions  Medication Instructions:  Your physician has recommended you make the following change in your medication:   INCREASE: pravastatin to 80 mg by mouth daily  *If you need a refill on your cardiac medications before your next appointment, please call your pharmacy*   Lab Work: IN 3 MONTHS: FLP, LFT If you have labs (blood work) drawn today and your tests are completely normal, you will receive your results only by: Marland Kitchen MyChart Message (if you have MyChart) OR . A paper copy in the mail If you have any lab test that is  abnormal or we need to change your  treatment, we will call you to review the results.   Testing/Procedures: Your physician has requested that you have an echocardiogram. Echocardiography is a painless test that uses sound waves to create images of your heart. It provides your doctor with information about the size and shape of your heart and how well your heart's chambers and valves are working. This procedure takes approximately one hour. There are no restrictions for this procedure.     Follow-Up: At [facility], you and your health needs are our priority.  As part of our continuing mission to provide you with exceptional heart care, we have created designated Provider Care Teams.  These Care Teams include your primary Cardiologist (physician) and Advanced Practice Providers (APPs -  Physician Assistants and Nurse Practitioners) who all work together to provide you with the care you need, when you need it.  We recommend signing up for the patient portal called "MyChart".  Sign up information is provided on this After Visit Summary.  MyChart is used to connect with patients for Virtual Visits (Telemedicine).  Patients are able to view lab/test results, encounter notes, upcoming appointments, etc.  Non-urgent messages can be sent to your provider as well.   To learn more about what you can do with MyChart, go to [email or web address].    Your next appointment:   12 month(s)  The format for your next appointment:   In Person  Provider:   You may see Werner Lean, MD or one of the following Advanced Practice Providers on your designated Care Team:    Melina Copa, PA-C  Ermalinda Barrios, PA-C          Signed, Werner Lean, MD  05/25/2020 11:20 AM    [place] Prior notes from [place]:  No reference to stress testing.  Records comprise 2011-2014.

## 2020-05-25 NOTE — Patient Instructions (Signed)
Medication Instructions:  Your physician has recommended you make the following change in your medication:   INCREASE: pravastatin to 80 mg by mouth daily  *If you need a refill on your cardiac medications before your next appointment, please call your pharmacy*   Lab Work: IN 3 MONTHS: FLP, LFT If you have labs (blood work) drawn today and your tests are completely normal, you will receive your results only by: Marland Kitchen MyChart Message (if you have MyChart) OR . A paper copy in the mail If you have any lab test that is abnormal or we need to change your treatment, we will call you to review the results.   Testing/Procedures: Your physician has requested that you have an echocardiogram. Echocardiography is a painless test that uses sound waves to create images of your heart. It provides your doctor with information about the size and shape of your heart and how well your heart's chambers and valves are working. This procedure takes approximately one hour. There are no restrictions for this procedure.     Follow-Up: At [facility], you and your health needs are our priority.  As part of our continuing mission to provide you with exceptional heart care, we have created designated Provider Care Teams.  These Care Teams include your primary Cardiologist (physician) and Advanced Practice Providers (APPs -  Physician Assistants and Nurse Practitioners) who all work together to provide you with the care you need, when you need it.  We recommend signing up for the patient portal called "MyChart".  Sign up information is provided on this After Visit Summary.  MyChart is used to connect with patients for Virtual Visits (Telemedicine).  Patients are able to view lab/test results, encounter notes, upcoming appointments, etc.  Non-urgent messages can be sent to your provider as well.   To learn more about what you can do with MyChart, go to [email or web address].    Your next appointment:   12  month(s)  The format for your next appointment:   In Person  Provider:   You may see Werner Lean, MD or one of the following Advanced Practice Providers on your designated Care Team:    Melina Copa, PA-C  Ermalinda Barrios, PA-C

## 2020-05-30 ENCOUNTER — Encounter: Payer: Self-pay | Admitting: Internal Medicine

## 2020-06-06 ENCOUNTER — Telehealth: Payer: Self-pay

## 2020-06-06 DIAGNOSIS — Z006 Encounter for examination for normal comparison and control in clinical research program: Secondary | ICD-10-CM

## 2020-06-06 NOTE — Telephone Encounter (Signed)
Called patient for 90 day Identify phone call pt stated he is not having anymore cardiac symptoms but did follow up with PCP, I reminded the patient that we would be calling him back around February for a year follow up phone call.

## 2020-06-07 ENCOUNTER — Encounter: Payer: Self-pay | Admitting: Internal Medicine

## 2020-06-07 ENCOUNTER — Ambulatory Visit: Payer: Medicare HMO | Admitting: Internal Medicine

## 2020-06-07 ENCOUNTER — Other Ambulatory Visit: Payer: Self-pay

## 2020-06-07 VITALS — BP 112/70 | HR 90 | Temp 98.3°F | Ht 71.0 in | Wt 202.6 lb

## 2020-06-07 DIAGNOSIS — E1122 Type 2 diabetes mellitus with diabetic chronic kidney disease: Secondary | ICD-10-CM | POA: Diagnosis not present

## 2020-06-07 DIAGNOSIS — I129 Hypertensive chronic kidney disease with stage 1 through stage 4 chronic kidney disease, or unspecified chronic kidney disease: Secondary | ICD-10-CM

## 2020-06-07 DIAGNOSIS — E663 Overweight: Secondary | ICD-10-CM

## 2020-06-07 DIAGNOSIS — I2510 Atherosclerotic heart disease of native coronary artery without angina pectoris: Secondary | ICD-10-CM

## 2020-06-07 DIAGNOSIS — N182 Chronic kidney disease, stage 2 (mild): Secondary | ICD-10-CM

## 2020-06-07 DIAGNOSIS — Z6828 Body mass index (BMI) 28.0-28.9, adult: Secondary | ICD-10-CM

## 2020-06-07 DIAGNOSIS — I1310 Hypertensive heart and chronic kidney disease without heart failure, with stage 1 through stage 4 chronic kidney disease, or unspecified chronic kidney disease: Secondary | ICD-10-CM | POA: Diagnosis not present

## 2020-06-07 LAB — HEMOGLOBIN A1C
Est. average glucose Bld gHb Est-mCnc: 166 mg/dL
Hgb A1c MFr Bld: 7.4 % — ABNORMAL HIGH (ref 4.8–5.6)

## 2020-06-07 NOTE — Patient Instructions (Signed)
Diabetes Mellitus and Exercise Exercising regularly is important for overall health, especially for people who have diabetes mellitus. Exercising is not only about losing weight. It has many other health benefits, such as increasing muscle strength and bone density and reducing body fat and stress. This leads to improved fitness, flexibility, and endurance, all of which result in better overall health. What are the benefits of exercise if I have diabetes? Exercise has many benefits for people with diabetes. They include:  Helping to lower and control blood sugar (glucose).  Helping the body to respond better to the hormone insulin by improving insulin sensitivity.  Reducing how much insulin the body needs.  Lowering the risk for heart disease by: ? Lowering "bad" cholesterol and triglyceride levels. ? Increasing "good" cholesterol levels. ? Lowering blood pressure. ? Lowering blood glucose levels. What is my activity plan? Your health care provider or certified diabetes educator can help you make a plan for the type and frequency of exercise that works for you. This is called your activity plan. Be sure to:  Get at least 150 minutes of medium-intensity or high-intensity exercise each week. Exercises may include brisk walking, biking, or water aerobics.  Do stretching and strengthening exercises, such as yoga or weight lifting, at least 2 times a week.  Spread out your activity over at least 3 days of the week.  Get some form of physical activity each day. ? Do not go more than 2 days in a row without some kind of physical activity. ? Avoid being inactive for more than 90 minutes at a time. Take frequent breaks to walk or stretch.  Choose exercises or activities that you enjoy. Set realistic goals.  Start slowly and gradually increase your exercise intensity over time.   How do I manage my diabetes during exercise? Monitor your blood glucose  Check your blood glucose before and  after exercising. If your blood glucose is: ? 240 mg/dL (13.3 mmol/L) or higher before you exercise, check your urine for ketones. These are chemicals created by the liver. If you have ketones in your urine, do not exercise until your blood glucose returns to normal. ? 100 mg/dL (5.6 mmol/L) or lower, eat a snack containing 15-20 grams of carbohydrate. Check your blood glucose 15 minutes after the snack to make sure that your glucose level is above 100 mg/dL (5.6 mmol/L) before you start your exercise.  Know the symptoms of low blood glucose (hypoglycemia) and how to treat it. Your risk for hypoglycemia increases during and after exercise. Follow these tips and your health care provider's instructions  Keep a carbohydrate snack that is fast-acting for use before, during, and after exercise to help prevent or treat hypoglycemia.  Avoid injecting insulin into areas of the body that are going to be exercised. For example, avoid injecting insulin into: ? Your arms, when you are about to play tennis. ? Your legs, when you are about to go jogging.  Keep records of your exercise habits. Doing this can help you and your health care provider adjust your diabetes management plan as needed. Write down: ? Food that you eat before and after you exercise. ? Blood glucose levels before and after you exercise. ? The type and amount of exercise you have done.  Work with your health care provider when you start a new exercise or activity. He or she may need to: ? Make sure that the activity is safe for you. ? Adjust your insulin, other medicines, and food that   you eat.  Drink plenty of water while you exercise. This prevents loss of water (dehydration) and problems caused by a lot of heat in the body (heat stroke).   Where to find more information  American Diabetes Association: www.diabetes.org Summary  Exercising regularly is important for overall health, especially for people who have diabetes  mellitus.  Exercising has many health benefits. It increases muscle strength and bone density and reduces body fat and stress. It also lowers and controls blood glucose.  Your health care provider or certified diabetes educator can help you make an activity plan for the type and frequency of exercise that works for you.  Work with your health care provider to make sure any new activity is safe for you. Also work with your health care provider to adjust your insulin, other medicines, and the food you eat. This information is not intended to replace advice given to you by your health care provider. Make sure you discuss any questions you have with your health care provider. Document Revised: 09/21/2018 Document Reviewed: 09/21/2018 Elsevier Patient Education  2021 Elsevier Inc.  

## 2020-06-07 NOTE — Progress Notes (Signed)
I,Katawbba Wiggins,acting as a Education administrator for Maximino Greenland, MD.,have documented all relevant documentation on the behalf of Maximino Greenland, MD,as directed by  Maximino Greenland, MD while in the presence of Maximino Greenland, MD.  This visit occurred during the SARS-CoV-2 public health emergency.  Safety protocols were in place, including screening questions prior to the visit, additional usage of staff PPE, and extensive cleaning of exam room while observing appropriate contact time as indicated for disinfecting solutions.  Subjective:     Patient ID: Kelly Lambert , male    DOB: [birth year] , 70 y.o.   MRN: [id number]   Chief Complaint  Patient presents with   Diabetes    HPI  The patient is here today for a diabetes/HTN f/u. He denies headaches, chest pain and shortness of breath. He states he has made some dietary changes.   Diabetes He presents for his follow-up diabetic visit. He has type 2 diabetes mellitus. There are no hypoglycemic associated symptoms. Pertinent negatives for diabetes include no blurred vision and no chest pain. There are no hypoglycemic complications. Diabetic complications include nephropathy. Risk factors for coronary artery disease include diabetes mellitus, dyslipidemia, hypertension and male sex. He is compliant with treatment most of the time. He is following a diabetic diet. Meal planning includes avoidance of concentrated sweets. He participates in exercise intermittently. His home blood glucose trend is fluctuating minimally. His breakfast blood glucose is taken between 9-10 am. His breakfast blood glucose range is generally 110-130 mg/dl. An ACE inhibitor/angiotensin II receptor blocker is being taken. Eye exam is current.  Hypertension This is a chronic problem. The current episode started more than 1 year ago. The problem has been gradually improving since onset. The problem is controlled. Pertinent negatives include no blurred vision, chest pain,  palpitations or shortness of breath. Risk factors for coronary artery disease include diabetes mellitus, dyslipidemia and male gender. Past treatments include angiotensin blockers and diuretics. The current treatment provides moderate improvement. Compliance problems include exercise.  Hypertensive end-organ damage includes kidney disease.    Past Medical History:  Diagnosis Date   BPH (benign prostatic hyperplasia)    Diabetes mellitus without complication ([place])    Hyperlipidemia associated with type 2 diabetes mellitus ([place])    Hypertension      Family History  Problem Relation Age of Onset   Thyroid cancer Mother    Cancer Father    Cancer Brother      Current Outpatient Medications:    Ascorbic Acid (VITAMIN C PO), Take by mouth., Disp: , Rfl:    aspirin EC 81 MG tablet, Take 81 mg by mouth daily., Disp: , Rfl:    Blood Glucose Calibration (ACCU-CHEK GUIDE CONTROL) LIQD, Use as directed, Disp: 1 each, Rfl: 1   Blood Glucose Monitoring Suppl (ACCU-CHEK GUIDE) w/Device KIT, 1 kit by Does not apply route 3 (three) times daily., Disp: 1 kit, Rfl: 3   chlorthalidone (HYGROTON) 25 MG tablet, TAKE 1 TABLET(25 MG) BY MOUTH DAILY, Disp: 90 tablet, Rfl: 2   gabapentin (NEURONTIN) 100 MG capsule, Take 1 capsule (100 mg total) by mouth at bedtime., Disp: 90 capsule, Rfl: 2   Lancets (ACCU-CHEK MULTICLIX) lancets, Use to check blood sugars 3 times a day. Dx code: e11.65, Disp: 300 each, Rfl: 2   metFORMIN (GLUCOPHAGE) 1000 MG tablet, TAKE 1 TABLET TWICE DAILY WITH A MEAL, Disp: 180 tablet, Rfl: 2   Multiple Vitamin (MULTIVITAMIN) tablet, Take 1 tablet by mouth daily., Disp: , Rfl:  pantoprazole (PROTONIX) 40 MG tablet, Take 1 tablet (40 mg total) by mouth daily., Disp: 90 tablet, Rfl: 2   pravastatin (PRAVACHOL) 80 MG tablet, Take 1 tablet (80 mg total) by mouth every evening., Disp: 90 tablet, Rfl: 3   tamsulosin (FLOMAX) 0.4 MG CAPS capsule, Take 1 capsule (0.4 mg total) by mouth 2 (two)  times daily., Disp: 160 capsule, Rfl: 2   valsartan (DIOVAN) 160 MG tablet, TAKE 1 TABLET EVERY DAY, Disp: 90 tablet, Rfl: 2   VITAMIN D, CHOLECALCIFEROL, PO, Take by mouth., Disp: , Rfl:    fluticasone (FLONASE) 50 MCG/ACT nasal spray, Place 1 spray into both nostrils daily., Disp: 16 g, Rfl: 2   glucose blood (ACCU-CHEK GUIDE) test strip, TEST BLOOD SUGAR THREE TIMES DAILY, Disp: 300 strip, Rfl: 2   Allergies  Allergen Reactions   Amlodipine Other (See Comments)    Headaches    Farxiga [Dapagliflozin] Other (See Comments)    headaches     Review of Systems  Constitutional: Negative.   HENT: Negative.    Eyes: Negative.  Negative for blurred vision.  Respiratory: Negative.  Negative for shortness of breath.   Cardiovascular: Negative.  Negative for chest pain and palpitations.  Gastrointestinal: Negative.   Neurological: Negative.   Psychiatric/Behavioral: Negative.    All other systems reviewed and are negative.   Today's Vitals   06/07/20 1128  BP: 112/70  Pulse: 90  Temp: 98.3 F (36.8 C)  TempSrc: Oral  Weight: 202 lb 9.6 oz (91.9 kg)  Height: _0  (1.803 m)  PainSc: 0-No pain   Body mass index is 28.26 kg/m.  Wt Readings from Last 3 Encounters:  06/07/20 202 lb 9.6 oz (91.9 kg)  05/25/20 206 lb (93.4 kg)  04/05/20 211 lb 12.8 oz (96.1 kg)   BP Readings from Last 3 Encounters:  06/07/20 112/70  05/25/20 120/78  04/05/20 118/80   Objective:  Physical Exam Vitals and nursing note reviewed.  Constitutional:      Appearance: Normal appearance.  HENT:     Nose:     Comments: Masked     Mouth/Throat:     Comments: Masked  Cardiovascular:     Rate and Rhythm: Normal rate and regular rhythm.     Heart sounds: Normal heart sounds.  Pulmonary:     Effort: Pulmonary effort is normal.     Breath sounds: Normal breath sounds.  Skin:    General: Skin is warm.  Neurological:     General: No focal deficit present.     Mental Status: He is alert.   Psychiatric:        Mood and Affect: Mood normal.        Assessment And Plan:     1. Type 2 diabetes mellitus with stage 2 chronic kidney disease, without long-term current use of insulin ([place]) Comments: Unable to tolerate Farxiga in the past due to headaches. I will check an a1c today, BMP results from 05/24/20 reviewed in detail.  - Hemoglobin A1c  2. Hypertensive heart and renal disease with renal failure, stage 1 through stage 4 or unspecified chronic kidney disease, without heart failure Comments: Chronic, well controlled. Congratulated on his lifestyle changes.   3. Coronary artery disease involving native coronary artery of native heart without angina pectoris Comments: Chronic, cardiac CT performed Feb 2022. Significant for calcium score 94, mild non-obstructive CAD.  He is encouraged to follow heart healthy lifestyle - free of processed/packaged/fried foods, exercise at least 150 min/wk and increase  fiber intake.   4. Overweight with body mass index (BMI) of 28 to 28.9 in adult Comments: His BMi is acceptable for his demographic.    Patient was given opportunity to ask questions. Patient verbalized understanding of the plan and was able to repeat key elements of the plan. All questions were answered to their satisfaction.   I, Maximino Greenland, MD, have reviewed all documentation for this visit. The documentation on 06/25/20 for the exam, diagnosis, procedures, and orders are all accurate and complete.  IF YOU HAVE BEEN REFERRED TO A SPECIALIST, IT MAY TAKE 1-2 WEEKS TO SCHEDULE/PROCESS THE REFERRAL. IF YOU HAVE NOT HEARD FROM US/SPECIALIST IN TWO WEEKS, PLEASE GIVE [place] A CALL AT [phone].   THE PATIENT IS ENCOURAGED TO PRACTICE SOCIAL DISTANCING DUE TO THE COVID-19 PANDEMIC.

## 2020-06-09 ENCOUNTER — Other Ambulatory Visit: Payer: Self-pay

## 2020-06-11 ENCOUNTER — Encounter: Payer: Self-pay | Admitting: Internal Medicine

## 2020-06-12 ENCOUNTER — Ambulatory Visit (INDEPENDENT_AMBULATORY_CARE_PROVIDER_SITE_OTHER): Payer: Medicare HMO

## 2020-06-12 ENCOUNTER — Telehealth: Payer: Self-pay

## 2020-06-12 ENCOUNTER — Telehealth: Payer: Medicare HMO

## 2020-06-12 DIAGNOSIS — N182 Chronic kidney disease, stage 2 (mild): Secondary | ICD-10-CM

## 2020-06-12 DIAGNOSIS — E1122 Type 2 diabetes mellitus with diabetic chronic kidney disease: Secondary | ICD-10-CM

## 2020-06-12 DIAGNOSIS — E559 Vitamin D deficiency, unspecified: Secondary | ICD-10-CM

## 2020-06-12 DIAGNOSIS — I129 Hypertensive chronic kidney disease with stage 1 through stage 4 chronic kidney disease, or unspecified chronic kidney disease: Secondary | ICD-10-CM

## 2020-06-12 DIAGNOSIS — I2510 Atherosclerotic heart disease of native coronary artery without angina pectoris: Secondary | ICD-10-CM

## 2020-06-12 NOTE — Chronic Care Management (AMB) (Signed)
Chronic Care Management   CCM RN Visit Note  06/12/2020 Name: Kelly Lambert MRN: [id number] DOB: 12/18/1950  Subjective: Kelly Lambert is a 70 y.o. year old male who is a primary care patient of [name], MD. The care management team was consulted for assistance with disease management and care coordination needs.    Engaged with patient by telephone for follow up visit in response to provider referral for case management and/or care coordination services.   Consent to Services:  The patient was given information about Chronic Care Management services, agreed to services, and gave verbal consent prior to initiation of services.  Please see initial visit note for detailed documentation.   Patient agreed to services and verbal consent obtained.   Assessment: Review of patient past medical history, allergies, medications, health status, including review of consultants reports, laboratory and other test data, was performed as part of comprehensive evaluation and provision of chronic care management services.   SDOH (Social Determinants of Health) assessments and interventions performed:  No  CCM Care Plan  Allergies  Allergen Reactions  . Amlodipine Other (See Comments)    Headaches   . Wilder Glade [Dapagliflozin] Other (See Comments)    headaches    Outpatient Encounter Medications as of 06/12/2020  Medication Sig  . Ascorbic Acid (VITAMIN C PO) Take by mouth.  Marland Kitchen aspirin EC 81 MG tablet Take 81 mg by mouth daily.  . Blood Glucose Calibration (ACCU-CHEK GUIDE CONTROL) LIQD Use as directed  . Blood Glucose Monitoring Suppl (ACCU-CHEK GUIDE) w/Device KIT 1 kit by Does not apply route 3 (three) times daily.  . chlorthalidone (HYGROTON) 25 MG tablet TAKE 1 TABLET(25 MG) BY MOUTH DAILY  . fluticasone (FLONASE) 50 MCG/ACT nasal spray Place 1 spray into both nostrils daily.  Marland Kitchen gabapentin (NEURONTIN) 100 MG capsule Take 1 capsule (100 mg total) by mouth at bedtime.  Marland Kitchen glucose blood  (ACCU-CHEK GUIDE) test strip Use to check blood sugar 3 times a day. Dx code E11.65  . Lancets (ACCU-CHEK MULTICLIX) lancets Use to check blood sugars 3 times a day. Dx code: e11.65  . metFORMIN (GLUCOPHAGE) 1000 MG tablet TAKE 1 TABLET TWICE DAILY WITH A MEAL  . Multiple Vitamin (MULTIVITAMIN) tablet Take 1 tablet by mouth daily.  . pantoprazole (PROTONIX) 40 MG tablet Take 1 tablet (40 mg total) by mouth daily.  . pravastatin (PRAVACHOL) 80 MG tablet Take 1 tablet (80 mg total) by mouth every evening.  . tamsulosin (FLOMAX) 0.4 MG CAPS capsule Take 1 capsule (0.4 mg total) by mouth 2 (two) times daily.  . valsartan (DIOVAN) 160 MG tablet TAKE 1 TABLET EVERY DAY  . VITAMIN D, CHOLECALCIFEROL, PO Take by mouth.   No facility-administered encounter medications on file as of 06/12/2020.    Patient Active Problem List   Diagnosis Date Noted  . PSVT (paroxysmal supraventricular tachycardia) ([place]) 05/25/2020  . Coronary artery disease involving native coronary artery of native heart without angina pectoris 05/25/2020  . Class 1 obesity due to excess calories without serious comorbidity with body mass index (BMI) of 30.0 to 30.9 in adult 04/24/2020  . DOE (dyspnea on exertion) 02/04/2020  . Diabetes mellitus with coincident hypertension ([place]) 02/04/2020  . Mixed hyperlipidemia 02/04/2020  . Palpitations 02/04/2020  . Personal history of colonic polyps 03/01/2019  . Type 2 diabetes mellitus with stage 2 chronic kidney disease, without long-term current use of insulin ([place]) 03/01/2019  . Diabetic mononeuropathy associated with type 2 diabetes mellitus ([place]) 11/24/2017  . Hypertensive nephropathy  11/24/2017  . Chronic renal disease, stage II 11/24/2017    Conditions to be addressed/monitored:DM, HTN, Vitamin D deficiency, HLD    Patient Care Plan: Diabetes Type 2 (Adult)    Problem Identified: Glycemic Management (Diabetes, Type 2)   Priority: High    Long-Range Goal: Glycemic Management  Optimized   Start Date: 06/12/2020  Expected End Date: 06/12/2021  This Visit's Progress: On track  Priority: High  Note:   Objective:  Lab Results  Component Value Date   HGBA1C 7.4 (H) 06/07/2020 .   Lab Results  Component Value Date   CREATININE 1.21 05/24/2020   CREATININE 1.16 02/14/2020   CREATININE 1.03 12/08/2019 .   Lab Results  Component Value Date   EGFR 65 05/24/2020 .   Current Barriers:  Marland Kitchen Knowledge Deficits related to basic Diabetes pathophysiology and self care/management . Knowledge Deficits related to medications used for management of diabetes Case Manager Clinical Goal(s):  . patient will demonstrate improved adherence to prescribed treatment plan for diabetes self care/management as evidenced by: daily monitoring and recording of CBG  adherence to ADA/ carb modified diet exercise 5 days/week adherence to prescribed medication regimen contacting provider for new or worsened symptoms or questions Interventions:  . Collaboration with [name], MD regarding development and update of comprehensive plan of care as evidenced by provider attestation and co-signature . Inter-disciplinary care team collaboration (see longitudinal plan of care) . Provided education to patient about basic DM disease process . Review of patient status, including review of consultants reports, relevant laboratory and other test results, and medications completed. . Reviewed medications with patient and discussed importance of medication adherence; Determined PCP is recommending Wilder Glade and patient is agreeable . Discussed PCP will forward to his preferred pharmacy; Reviewed and discussed next Pharm D follow up is scheduled for 06/21/20 @ 1PM . Provided patient with written educational materials related to hypo and hyperglycemia and importance of correct treatment . Advised patient, providing education and rationale, to check cbg daily before meals and at bedtime and record, calling PCP and  or CCM team for findings outside established parameters.   . Discussed plans with patient for ongoing care management follow up and provided patient with direct contact information for care management team Self-Care Activities Self administers oral medications as prescribed Attends all scheduled provider appointments Checks blood sugars as prescribed and utilize hyper and hypoglycemia protocol as needed Adheres to prescribed ADA/carb modified Patient Goals: - check blood sugar at prescribed times - check blood sugar before and after exercise - check blood sugar if I feel it is too high or too low - enter blood sugar readings and medication or insulin into daily log - take the blood sugar log to all doctor visits - take the blood sugar meter to all doctor visits - manage portion size - keep appointment with eye doctor - schedule appointment with eye doctor - check feet daily for cuts, sores or redness - do heel pump exercise 2 to 3 times each day - keep feet up while sitting - trim toenails straight across - wash and dry feet carefully every day - wear comfortable, cotton socks - wear comfortable, well-fitting shoes  Follow Up Plan: Telephone follow up appointment with care management team member scheduled for: 08/11/20     Plan:Telephone follow up appointment with care management team member scheduled for:  08/11/20  Barb Merino, RN, BSN, CCM Care Management Coordinator [place] [facility]  Direct Phone: [phone]

## 2020-06-12 NOTE — Telephone Encounter (Signed)
  Care Management   Follow Up Note   06/12/2020 Name: Kelly Lambert MRN: [id number] DOB: 08-Nov-1950   Referred by: [name], MD Reason for referral : Chronic Care Management (RNCM Follow up call - 1st attempt )   An unsuccessful telephone outreach was attempted today. The patient was referred to the case management team for assistance with care management and care coordination.   Follow Up Plan: A HIPPA compliant phone message was left for the patient providing contact information and requesting a return call.   Barb Merino, RN, BSN, CCM Care Management Coordinator [place] [facility]  Direct Phone: [phone]

## 2020-06-12 NOTE — Patient Instructions (Signed)
Goals Addressed    Other   .  Diabetes - Glycemic Management Optimized   On track     Timeframe:  Long-Range Goal Priority:  High Start Date:  06/12/20                          Expected End Date:  06/12/21  Next Scheduled Follow up date: 08/11/20   Self-Care Activities Self administers oral medications as prescribed Attends all scheduled provider appointments Checks blood sugars as prescribed and utilize hyper and hypoglycemia protocol as needed Adheres to prescribed ADA/carb modified Patient Goals: - check blood sugar at prescribed times - check blood sugar before and after exercise - check blood sugar if I feel it is too high or too low - enter blood sugar readings and medication or insulin into daily log - take the blood sugar log to all doctor visits - take the blood sugar meter to all doctor visits - manage portion size -

## 2020-06-19 ENCOUNTER — Other Ambulatory Visit: Payer: Self-pay | Admitting: Internal Medicine

## 2020-06-20 ENCOUNTER — Telehealth: Payer: Self-pay

## 2020-06-20 NOTE — Chronic Care Management (AMB) (Signed)
  No answer, left message of telephone appointment with Orlando Penner on 06-21-2020 at 1:00. Left message to have all medications, supplements, blood pressure and/or blood sugar logs available during appointment and to return call if need to reschedule.   [place] Pharmacist Assistant [phone]

## 2020-06-21 ENCOUNTER — Ambulatory Visit: Payer: Self-pay

## 2020-06-21 DIAGNOSIS — N182 Chronic kidney disease, stage 2 (mild): Secondary | ICD-10-CM | POA: Diagnosis not present

## 2020-06-21 DIAGNOSIS — I2510 Atherosclerotic heart disease of native coronary artery without angina pectoris: Secondary | ICD-10-CM

## 2020-06-21 DIAGNOSIS — I129 Hypertensive chronic kidney disease with stage 1 through stage 4 chronic kidney disease, or unspecified chronic kidney disease: Secondary | ICD-10-CM

## 2020-06-21 DIAGNOSIS — E1122 Type 2 diabetes mellitus with diabetic chronic kidney disease: Secondary | ICD-10-CM | POA: Diagnosis not present

## 2020-06-21 NOTE — Progress Notes (Signed)
Chronic Care Management Pharmacy Note  06/29/2020 Name:  Kelly Lambert MRN:  [id number] DOB:  [birth year]  Summary: Patient reports that he is doing okay   Recommendations/Changes made from today's visit: Recommend patient get shingrix vaccine Recommend patients pravastatin possibly be changed to atorvastatin   Plan: Collaborate with PCP to determine best plan of action for patient    Subjective: Kelly Lambert is an 70 y.o. year old male who is a primary patient of Kelly Chard, MD.  The CCM team was consulted for assistance with disease management and care coordination needs.    Engaged with patient by telephone for follow up visit in response to provider referral for pharmacy case management and/or care coordination services.   Consent to Services:  The patient was given information about Chronic Care Management services, agreed to services, and gave verbal consent prior to initiation of services.  Please see initial visit note for detailed documentation.   Patient Care Team: Kelly Chard, MD as PCP - General (Internal Medicine) Werner Lean, MD as PCP - Cardiology (Cardiology) Lynne Logan, RN as Case Manager Mayford Knife, [facility] (Pharmacist)  Recent office visits: 06/07/2020 PCP OV 03/21/2020 PCP OV  Recent consult visits: 05/25/2020 [facility] visits: None in previous 6 months   Objective:  Lab Results  Component Value Date   CREATININE 1.21 05/24/2020   BUN 18 05/24/2020   GFRNONAA 64 02/14/2020   GFRAA 74 02/14/2020   NA 139 05/24/2020   K 4.2 05/24/2020   CALCIUM 10.1 05/24/2020   CO2 23 05/24/2020   GLUCOSE 107 (H) 05/24/2020    Lab Results  Component Value Date/Time   HGBA1C 7.4 (H) 06/07/2020 02:23 PM   HGBA1C 7.7 (H) 04/05/2020 04:10 PM   MICROALBUR 30 07/28/2019 02:50 PM   MICROALBUR 10 07/02/2018 12:35 PM    Last diabetic Eye exam:  Lab Results  Component Value Date/Time   HMDIABEYEEXA No Retinopathy  11/26/2019 12:00 AM    Last diabetic Foot exam: No results found for: HMDIABFOOTEX   Lab Results  Component Value Date   CHOL 154 05/05/2019   HDL 39 (L) 05/05/2019   LDLCALC 87 05/05/2019   TRIG 158 (H) 05/05/2019   CHOLHDL 3.9 05/05/2019    Hepatic Function Latest Ref Rng & Units 12/08/2019 05/05/2019 07/02/2018  Total Protein 6.0 - 8.5 g/dL 7.7 7.4 7.2  Albumin 3.8 - 4.8 g/dL 4.5 4.5 4.4  AST 0 - 40 IU/L 13 12 18   ALT 0 - 44 IU/L 12 14 13   Alk Phosphatase 44 - 121 IU/L 101 87 85  Total Bilirubin 0.0 - 1.2 mg/dL 0.3 <0.2 0.3    No results found for: TSH, FREET4  CBC Latest Ref Rng & Units 07/28/2019 07/02/2018  WBC 3.4 - 10.8 x10E3/uL 4.3 4.0  Hemoglobin 13.0 - 17.7 g/dL 13.2 13.5  Hematocrit 37.5 - 51.0 % 41.0 41.4  Platelets 150 - 450 x10E3/uL 198 206    Lab Results  Component Value Date/Time   VD25OH 23.9 (L) 07/28/2019 02:56 PM    Clinical ASCVD: Yes  The 10-year ASCVD risk score Kelly Bussing DC Jr., et al., 2013) is: 25.9%   Values used to calculate the score:     Age: 70 years     Sex: Male     Is Non-Hispanic African American: Yes     Diabetic: Yes     Tobacco smoker: No     Systolic Blood Pressure: 409 mmHg     Is BP  treated: Yes     HDL Cholesterol: 39 mg/dL     Total Cholesterol: 154 mg/dL    Depression screen [facility] 2/9 07/28/2019 07/27/2019 05/21/2018  Decreased Interest 0 0 0  Down, Depressed, Hopeless 0 0 0  PHQ - 2 Score 0 0 0  Altered sleeping 1 - 0  Tired, decreased energy 0 - 0  Change in appetite 0 - 0  Feeling bad or failure about yourself  0 - 0  Trouble concentrating 0 - 0  Moving slowly or fidgety/restless 0 - 0  Suicidal thoughts 0 - 0  PHQ-9 Score 1 - 0  Difficult doing work/chores Not difficult at all - -       Social History   Tobacco Use  Smoking Status Former   Packs/day: 1.00   Years: 25.00   Pack years: 25.00   Types: Cigarettes   Quit date: 1999   Years since quitting: 23.4  Smokeless Tobacco Former   Types: Chew   Quit  date: 2018   BP Readings from Last 3 Encounters:  06/07/20 112/70  05/25/20 120/78  04/05/20 118/80   Pulse Readings from Last 3 Encounters:  06/07/20 90  05/25/20 67  04/05/20 67   Wt Readings from Last 3 Encounters:  06/07/20 202 lb 9.6 oz (91.9 kg)  05/25/20 206 lb (93.4 kg)  04/05/20 211 lb 12.8 oz (96.1 kg)   BMI Readings from Last 3 Encounters:  06/07/20 28.26 kg/m  05/25/20 28.73 kg/m  04/05/20 30.39 kg/m    Assessment/Interventions: Review of patient past medical history, allergies, medications, health status, including review of consultants reports, laboratory and other test data, was performed as part of comprehensive evaluation and provision of chronic care management services.   SDOH:  (Social Determinants of Health) assessments and interventions performed: Yes  SDOH Screenings   Alcohol Screen: Not on file  Depression (PHQ2-9): Low Risk    PHQ-2 Score: 1  Financial Resource Strain: Low Risk    Difficulty of Paying Living Expenses: Not hard at all  Food Insecurity: No Food Insecurity   Worried About Charity fundraiser in the Last Year: Never true   Ran Out of Food in the Last Year: Never true  Housing: Not on file  Physical Activity: Inactive   Days of Exercise per Week: 0 days   Minutes of Exercise per Session: 0 min  Social Connections: Not on file  Stress: No Stress Concern Present   Feeling of Stress : Not at all  Tobacco Use: Medium Risk   Smoking Tobacco Use: Former   Smokeless Tobacco Use: Former  Transport planner Needs: No Data processing manager (Medical): No   Lack of Transportation (Non-Medical): No    CCM Care Plan  Allergies  Allergen Reactions   Amlodipine Other (See Comments)    Headaches    Farxiga [Dapagliflozin] Other (See Comments)    headaches    Medications Reviewed Today     Reviewed by Mayford Knife, RPH (Pharmacist) on 06/21/20 at 1408  Med List Status: <None>   Medication Order Taking?  Sig Documenting Provider Last Dose Status Informant  Ascorbic Acid (VITAMIN C PO) [id number] Yes Take by mouth. [provider] Taking Active   aspirin EC 81 MG tablet [id number] Yes Take 81 mg by mouth daily. [provider] Taking Active Self  Blood Glucose Calibration (ACCU-CHEK GUIDE CONTROL) Yehuda Budd [id number] Yes Use as directed Kelly Chard, MD Taking Active   Blood Glucose Monitoring Suppl (ACCU-CHEK GUIDE)  w/Device KIT [id number] Yes 1 kit by Does not apply route 3 (three) times daily. Kelly Chard, MD Taking Active   chlorthalidone (HYGROTON) 25 MG tablet [id number] Yes TAKE 1 TABLET(25 MG) BY MOUTH DAILY Kelly Chard, MD Taking Active   fluticasone [facility]) 50 MCG/ACT nasal spray [id number]  Place 1 spray into both nostrils daily. Kelly Chard, MD  Expired 12/08/19 2359   gabapentin (NEURONTIN) 100 MG capsule [id number] Yes Take 1 capsule (100 mg total) by mouth at bedtime. Kelly Chard, MD Taking Active   glucose blood (ACCU-CHEK GUIDE) test strip [id number] Yes TEST BLOOD SUGAR THREE TIMES DAILY Kelly Chard, MD Taking Active   Lancets (ACCU-CHEK MULTICLIX) lancets [id number] Yes Use to check blood sugars 3 times a day. Dx code: e11.65 Kelly Chard, MD Taking Active   metFORMIN (GLUCOPHAGE) 1000 MG tablet [id number] Yes TAKE 1 TABLET TWICE DAILY WITH A MEAL Kelly Chard, MD Taking Active   Multiple Vitamin (MULTIVITAMIN) tablet [id number] Yes Take 1 tablet by mouth daily. [provider] Taking Active Self  pantoprazole (PROTONIX) 40 MG tablet [id number] Yes Take 1 tablet (40 mg total) by mouth daily. Kelly Chard, MD Taking Active   pravastatin (PRAVACHOL) 80 MG tablet [id number] Yes Take 1 tablet (80 mg total) by mouth every evening. Werner Lean, MD Taking Active   tamsulosin (FLOMAX) 0.4 MG CAPS capsule [id number] Yes Take 1 capsule (0.4 mg total) by mouth 2 (two) times daily. Kelly Chard, MD Taking Active   valsartan (DIOVAN) 160 MG tablet  [id number] Yes TAKE 1 TABLET EVERY DAY Kelly Chard, MD Taking Active   VITAMIN D, CHOLECALCIFEROL, PO [id number] Yes Take by mouth. [provider] Taking Active             Patient Active Problem List   Diagnosis Date Noted   PSVT (paroxysmal supraventricular tachycardia) ([place]) 05/25/2020   Coronary artery disease involving native coronary artery of native heart without angina pectoris 05/25/2020   Class 1 obesity due to excess calories without serious comorbidity with body mass index (BMI) of 30.0 to 30.9 in adult 04/24/2020   DOE (dyspnea on exertion) 02/04/2020   Diabetes mellitus with coincident hypertension ([place]) 02/04/2020   Mixed hyperlipidemia 02/04/2020   Palpitations 02/04/2020   Personal history of colonic polyps 03/01/2019   Type 2 diabetes mellitus with stage 2 chronic kidney disease, without long-term current use of insulin ([place]) 03/01/2019   Diabetic mononeuropathy associated with type 2 diabetes mellitus ([place]) 11/24/2017   Hypertensive nephropathy 11/24/2017   Chronic renal disease, stage II 11/24/2017    Immunization History  Administered Date(s) Administered   Fluad Quad(high Dose 65+) 09/18/2018, 04/05/2020   Influenza-Unspecified 09/18/2018, 09/30/2018, 11/02/2019   PFIZER(Purple Top)SARS-COV-2 Vaccination 02/13/2019, 03/06/2019, 10/11/2019, 05/05/2020    Conditions to be addressed/monitored:  Hypertension and Hyperlipidemia  Care Plan : [place]  Updates made by Mayford Knife, [place] since 06/29/2020 12:00 AM     Problem: HLD, DM II, HTN   Priority: High     Goal: Disease Management   Start Date: 03/21/2020  Recent Progress: On track  Priority: High  Note:    Current Barriers:  Unable to achieve control of cholesterol and diabetes.     Pharmacist Clinical Goal(s):  Patient will achieve adherence to monitoring guidelines and medication adherence to achieve therapeutic efficacy through collaboration with PharmD and  provider.    Interventions: 1:1 collaboration with Kelly Chard, MD regarding development and update of comprehensive plan of care as evidenced by provider attestation and  co-signature Inter-disciplinary care team collaboration (see longitudinal plan of care) Comprehensive medication review performed; medication list updated in electronic medical record  Hypertension (BP goal <130/80) -Controlled -Current treatment: Chlorthalidone 25 mg tablet once per day Valsartan 160 mg tablet once per day  -Medications previously tried:Amlodipine 2.5 mg  -Current home readings: 120/74, 120/80 -Current exercise habits:will  -Denies hypotensive/hypertensive symptoms -Educated on BP goals and benefits of medications for prevention of heart attack, stroke and kidney damage; Daily salt intake goal < 2300 mg; Importance of home blood pressure monitoring; -Counseled to monitor BP at home at least once per day, document, and provide log at future appointments -Recommended to continue current medication  Hyperlipidemia/Coronary Artery Disease: (LDL goal < 70) -Uncontrolled -Current treatment: Aspirin 81 mg EC take one tablet daily Pravastatin 40 mg tablet once per day  -Current exercise habits: currently walking at least 10,000 steps per day.  -Educated on Cholesterol goals;  Benefits of statin for ASCVD risk reduction; Importance of limiting foods high in cholesterol; -Counseled on diet and exercise extensively Recommended to continue current medication -Collaborate with PCP to determine if patient can be started on atorvastatin 10 mg tablet once per day   Diabetes (A1c goal <7%) -Uncontrolled -Current medications: Metformin 1000 mg tablet twice per day  -Current home glucose readings - patient is checking his BS in the morning  fasting glucose: 06/20/2020-135 post prandial glucose: 06/19/2020- 195 after eating -Denies hypoglycemic/hyperglycemic symptoms -Current meal patterns:  breakfast:  patient reports that he eats a pack of crackers and two pieces of toast - prior to taking medicine  lunch: nothing, today usually an egg  dinner: Pork Chops, rice or baked potato, started eating yams, patient reports that he is eating more broccoli, cauliflower mashed potatoes to replace the mashed potatoes snacks: potato chips, he has cut back on his bread and cereal  drinks: water and coffee only, 1- 12 ounce can of soda  -Current exercise: walking at least 10,000 steps per day -Educated on A1c and blood sugar goals; Complications of diabetes including kidney damage, retinal damage, and cardiovascular disease; Exercise goal of 150 minutes per week; Benefits of routine self-monitoring of blood sugar; Sick day management: maintain hydration with high fluid intake and call MD if glucose above 400 Carbohydrate counting and/or plate method -Counseled to check feet daily and get yearly eye exams -Patient would prefer to not add medication to his current regimen -Counseled on diet and exercise extensively Recommended to continue current medication   Patient Goals/Self-Care Activities Patient will:  - take medications as prescribed focus on medication adherence by taking medication everyday.   Follow Up Plan: The patient has been provided with contact information for the care management team and has been advised to call with any health related questions or concerns.        Medication Assistance: None required.  Patient affirms current coverage meets needs.  Compliance/Adherence/Medication fill history: Care Gaps: Shingrix Vaccine  Star-Rating Drugs: Metformin 1000 mg Pravastatin 40 mg  Valsartan 160 mg  Patient's preferred pharmacy is:  [facility] - [place], [address] [place] [address] Phone: [phone] Fax: [phone]  [id number] - [place], [place] - [address] [place] [address] Phone: [phone] Fax: [phone]  Uses pill box? Yes Pt endorses 85% compliance  We discussed: Current pharmacy is preferred with insurance  plan and patient is satisfied with pharmacy services Patient decided to: Continue current medication management strategy  Care Plan and Follow Up Patient Decision:  Patient agrees to Care Plan and Follow-up.  Plan: The patient has been provided with contact information for the care management team and has been advised to call with any health related questions or concerns.   Orlando Penner, PharmD Clinical Pharmacist [facility] [phone]

## 2020-06-22 ENCOUNTER — Encounter: Payer: Self-pay | Admitting: Internal Medicine

## 2020-06-23 ENCOUNTER — Ambulatory Visit (HOSPITAL_COMMUNITY): Payer: Medicare HMO | Attending: Cardiology

## 2020-06-23 ENCOUNTER — Other Ambulatory Visit: Payer: Self-pay

## 2020-06-23 DIAGNOSIS — I471 Supraventricular tachycardia: Secondary | ICD-10-CM | POA: Insufficient documentation

## 2020-06-23 LAB — ECHOCARDIOGRAM COMPLETE
Area-P 1/2: 2.41 cm2
S' Lateral: 2.4 cm

## 2020-06-26 DIAGNOSIS — E1165 Type 2 diabetes mellitus with hyperglycemia: Secondary | ICD-10-CM | POA: Diagnosis not present

## 2020-06-27 ENCOUNTER — Other Ambulatory Visit: Payer: Self-pay | Admitting: Internal Medicine

## 2020-06-27 ENCOUNTER — Telehealth: Payer: Self-pay

## 2020-06-27 DIAGNOSIS — I712 Thoracic aortic aneurysm, without rupture, unspecified: Secondary | ICD-10-CM

## 2020-06-27 DIAGNOSIS — I719 Aortic aneurysm of unspecified site, without rupture: Secondary | ICD-10-CM

## 2020-06-27 DIAGNOSIS — E119 Type 2 diabetes mellitus without complications: Secondary | ICD-10-CM

## 2020-06-27 NOTE — Telephone Encounter (Signed)
Order placed for CT aorta in April 2023 and required BMET. Instructions left with CT order to schedule lab 1 week prior to test.

## 2020-06-27 NOTE — Telephone Encounter (Signed)
-----   Message from Werner Lean, MD sent at 06/24/2020  1:40 PM EDT ----- Results: Aortic Root Discrepancy may be overestimated in this study. Plan: CT Aorta prior to follow up visit  Werner Lean, MD

## 2020-06-28 NOTE — Patient Instructions (Signed)
Visit Information It was great speaking with you today!  Please let me know if you have any questions about our visit.   Goals Addressed             This Visit's Progress    Manage My Medicine       Timeframe:  Long-Range Goal Priority:  High Start Date:                             Expected End Date:                       Follow Up Date 11/21/2020    - call for medicine refill 2 or 3 days before it runs out - call if I am sick and can't take my medicine - keep a list of all the medicines I take; vitamins and herbals too - learn to read medicine labels - use a pillbox to sort medicine - use an alarm clock or phone to remind me to take my medicine    Why is this important?   These steps will help you keep on track with your medicines.           Patient Care Plan: CCM Pharmacy Care Plan     Problem Identified: HLD, DM II, HTN   Priority: High     Goal: Disease Management   Start Date: 03/21/2020  Recent Progress: On track  Priority: High  Note:    Current Barriers:  Unable to achieve control of cholesterol and diabetes.     Pharmacist Clinical Goal(s):  Patient will achieve adherence to monitoring guidelines and medication adherence to achieve therapeutic efficacy through collaboration with PharmD and provider.    Interventions: 1:1 collaboration with [name], MD regarding development and update of comprehensive plan of care as evidenced by provider attestation and co-signature Inter-disciplinary care team collaboration (see longitudinal plan of care) Comprehensive medication review performed; medication list updated in electronic medical record  Hypertension (BP goal <130/80) -Controlled -Current treatment: Chlorthalidone 25 mg tablet once per day Valsartan 160 mg tablet once per day  -Medications previously tried:Amlodipine 2.5 mg  -Current home readings: 120/74, 120/80 -Current exercise habits:will  -Denies hypotensive/hypertensive  symptoms -Educated on BP goals and benefits of medications for prevention of heart attack, stroke and kidney damage; Daily salt intake goal < 2300 mg; Importance of home blood pressure monitoring; -Counseled to monitor BP at home at least once per day, document, and provide log at future appointments -Recommended to continue current medication  Hyperlipidemia: (LDL goal < 70) -Uncontrolled -Current treatment: Aspirin 81 mg EC take one tablet daily Pravastatin 40 mg tablet once per day  -Current exercise habits: currently walking at least 10,000 steps per day.  -Educated on Cholesterol goals;  Benefits of statin for ASCVD risk reduction; Importance of limiting foods high in cholesterol; -Counseled on diet and exercise extensively Recommended to continue current medication -Collaborate with PCP to determine if patient can be started on atorvastatin 10 mg tablet once per day   Diabetes (A1c goal <7%) -Uncontrolled -Current medications: Metformin 1000 mg tablet twice per day  -Current home glucose readings - patient is checking his BS in the morning  fasting glucose: 06/20/2020-135 post prandial glucose: 06/19/2020- 195 after eating -Denies hypoglycemic/hyperglycemic symptoms -Current meal patterns:  breakfast: patient reports that he eats a pack of crackers and two pieces of toast - prior to taking medicine  lunch: nothing, today  usually an egg  dinner: Pork Chops, rice or baked potato, started eating yams, patient reports that he is eating more broccoli, cauliflower mashed potatoes to replace the mashed potatoes snacks: potato chips, he has cut back on his bread and cereal  drinks: water and coffee only, 1- 12 ounce can of soda  -Current exercise: walking at least 10,000 steps per day -Educated on A1c and blood sugar goals; Complications of diabetes including kidney damage, retinal damage, and cardiovascular disease; Exercise goal of 150 minutes per week; Benefits of routine  self-monitoring of blood sugar; Sick day management: maintain hydration with high fluid intake and call MD if glucose above 400 Carbohydrate counting and/or plate method -Counseled to check feet daily and get yearly eye exams -Patient would prefer to not add medication to his current regimen -Counseled on diet and exercise extensively Recommended to continue current medication   Patient Goals/Self-Care Activities Patient will:  - take medications as prescribed focus on medication adherence by taking medication everyday.   Follow Up Plan: The patient has been provided with contact information for the care management team and has been advised to call with any health related questions or concerns.       Patient agreed to services and verbal consent obtained.   The patient verbalized understanding of instructions, educational materials, and care plan provided today and agreed to receive a mailed copy of patient instructions, educational materials, and care plan.   Orlando Penner, PharmD Clinical Pharmacist [facility] [phone]

## 2020-07-05 ENCOUNTER — Telehealth: Payer: Medicare HMO

## 2020-07-14 IMAGING — DX DG LUMBAR SPINE COMPLETE 4+V
5 series · 5 of 5 positions shown · non-contrast
Comparison: None.

CLINICAL DATA: Back pain and hip pain.

EXAM:
LUMBAR SPINE - COMPLETE 4+ VIEW

[l-spine ap]
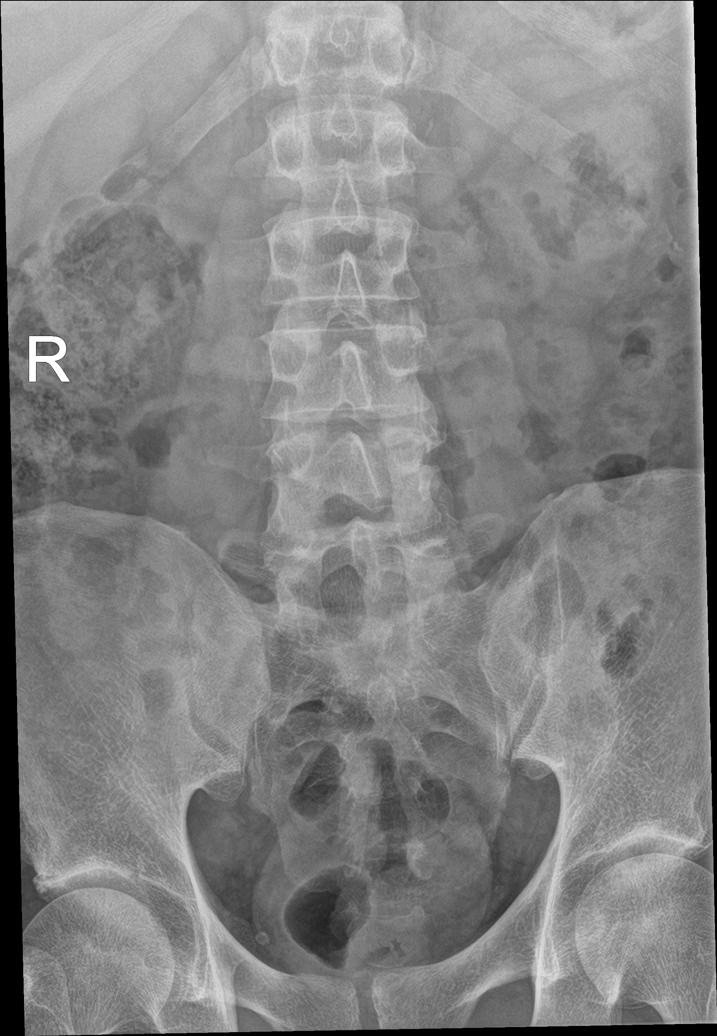

[l-spine obl (1 of 2)]
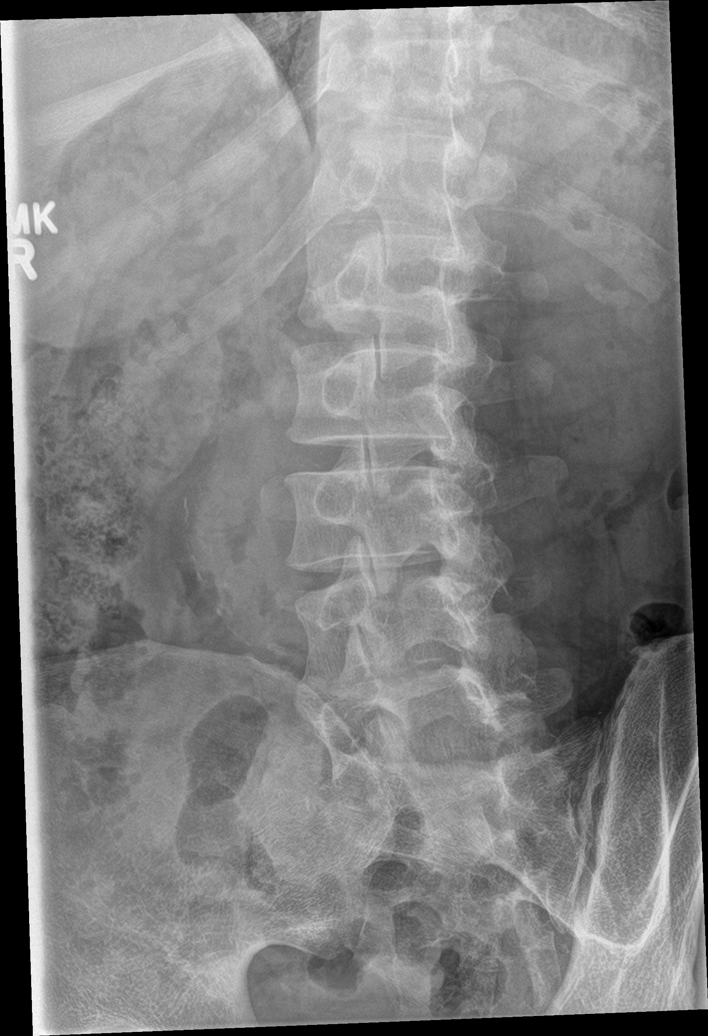

[l-spine obl (2 of 2)]
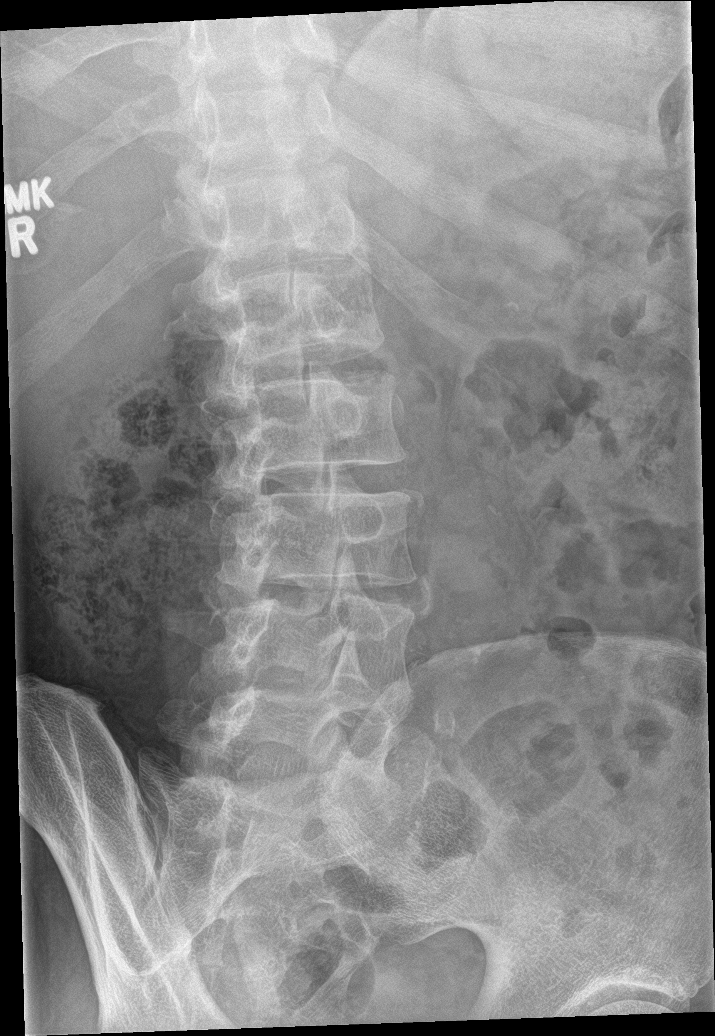

[l-spine lat]
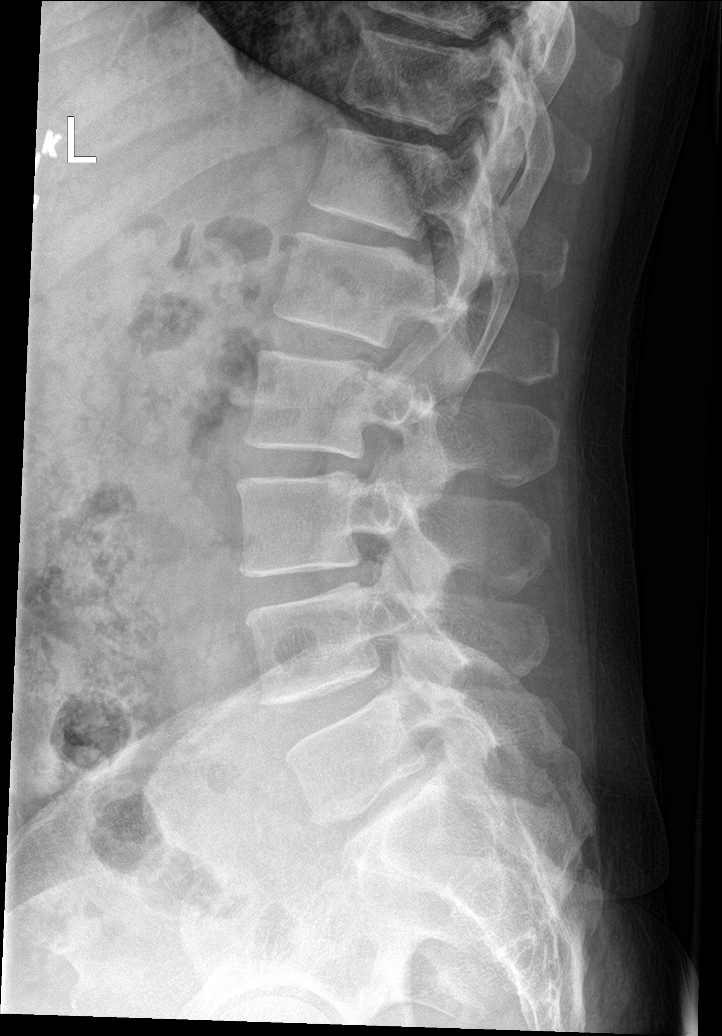

[l-spine spot]
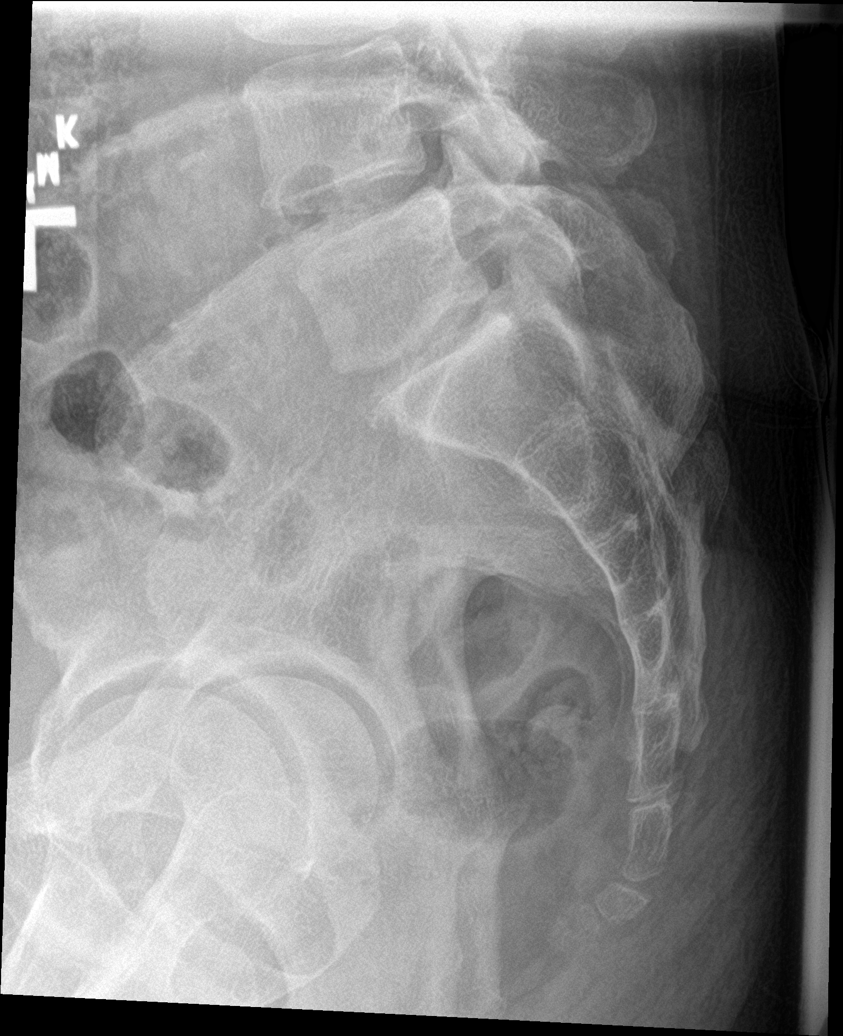

[5 of 5 positions shown; findings below may reference images not displayed]

FINDINGS: There is no evidence of lumbar spine fracture. Alignment is normal.
Intervertebral disc spaces are maintained.
IMPRESSION: Negative.

## 2020-08-03 ENCOUNTER — Encounter: Payer: Medicare HMO | Admitting: Internal Medicine

## 2020-08-03 ENCOUNTER — Telehealth: Payer: Self-pay | Admitting: *Deleted

## 2020-08-03 ENCOUNTER — Telehealth: Payer: Self-pay

## 2020-08-03 NOTE — Chronic Care Management (AMB) (Signed)
  Care Management   Note  08/03/2020 Name: Dameon Avanessian MRN: [id number] DOB: [birth year]  Sircharles Sandvik is a 70 y.o. year old male who is a primary care patient of [name], MD and is actively engaged with the care management team. I reached out to [facility] by phone today to assist with re-scheduling a follow up visit with the RN Case Manager  Follow up plan: Unsuccessful telephone outreach attempt made. A HIPAA compliant phone message was left for the patient providing contact information and requesting a return call.  The care management team will reach out to the patient again over the next 7 days.  If patient returns call to provider office, please advise to call [place]  at [phone].  [place] Management  Direct Dial: [phone]

## 2020-08-03 NOTE — Telephone Encounter (Signed)
This nurse called patient in regards to missed AWV. We rescheduled to 08/09/2020

## 2020-08-03 NOTE — Chronic Care Management (AMB) (Signed)
  Care Management   Note  08/03/2020 Name: Kelly Lambert MRN: [id number] DOB: 1951-01-02  Kelly Lambert is a 70 y.o. year old male who is a primary care patient of [name], MD and is actively engaged with the care management team. I reached out to [facility] by phone today to assist with re-scheduling a follow up visit with the RN Case Manager  Follow up plan: Telephone appointment with care management team member scheduled for:08/29/2020  [place], [place] Management  Direct Dial: [phone]

## 2020-08-09 ENCOUNTER — Encounter: Payer: Medicare HMO | Admitting: Internal Medicine

## 2020-08-09 ENCOUNTER — Other Ambulatory Visit: Payer: Self-pay

## 2020-08-09 ENCOUNTER — Ambulatory Visit (INDEPENDENT_AMBULATORY_CARE_PROVIDER_SITE_OTHER): Payer: Medicare HMO

## 2020-08-09 VITALS — BP 108/68 | HR 93 | Temp 98.3°F | Ht 71.0 in | Wt 200.2 lb

## 2020-08-09 DIAGNOSIS — N182 Chronic kidney disease, stage 2 (mild): Secondary | ICD-10-CM | POA: Diagnosis not present

## 2020-08-09 DIAGNOSIS — Z0000 Encounter for general adult medical examination without abnormal findings: Secondary | ICD-10-CM | POA: Diagnosis not present

## 2020-08-09 DIAGNOSIS — E1122 Type 2 diabetes mellitus with diabetic chronic kidney disease: Secondary | ICD-10-CM

## 2020-08-09 LAB — POCT URINALYSIS DIPSTICK
Bilirubin, UA: NEGATIVE
Blood, UA: NEGATIVE
Glucose, UA: NEGATIVE
Ketones, UA: NEGATIVE
Leukocytes, UA: NEGATIVE
Nitrite, UA: NEGATIVE
Protein, UA: NEGATIVE
Spec Grav, UA: 1.02 (ref 1.010–1.025)
Urobilinogen, UA: 0.2 E.U./dL
pH, UA: 5.5 (ref 5.0–8.0)

## 2020-08-09 LAB — POCT UA - MICROALBUMIN
Albumin/Creatinine Ratio, Urine, POC: <30
Creatinine, POC: 300 mg/dL
Microalbumin Ur, POC: 10 mg/L

## 2020-08-09 NOTE — Progress Notes (Signed)
This visit occurred during the SARS-CoV-2 public health emergency.  Safety protocols were in place, including screening questions prior to the visit, additional usage of staff PPE, and extensive cleaning of exam room while observing appropriate contact time as indicated for disinfecting solutions.  Subjective:   Kelly Lambert is a 70 y.o. male who presents for Medicare Annual/Subsequent preventive examination.  Review of Systems     Cardiac Risk Factors include: advanced age (>15mn, >>42women);diabetes mellitus;dyslipidemia;hypertension;male gender     Objective:    Today's Vitals   08/09/20 1026  BP: 108/68  Pulse: 93  Temp: 98.3 F (36.8 C)  TempSrc: Oral  SpO2: 97%  Weight: 200 lb 3.2 oz (90.8 kg)  Height: 5' 11"  (1.803 m)   Body mass index is 27.92 kg/m.  Advanced Directives 08/09/2020 07/28/2019 05/21/2018 04/07/2018  Does Patient Have a Medical Advance Directive? No No No No  Would patient like information on creating a medical advance directive? Yes (MAU/Ambulatory/Procedural Areas - Information given) - - No - Patient declined    Current Medications (verified) Outpatient Encounter Medications as of 08/09/2020  Medication Sig   Ascorbic Acid (VITAMIN C PO) Take by mouth.   aspirin EC 81 MG tablet Take 81 mg by mouth daily.   Blood Glucose Calibration (ACCU-CHEK GUIDE CONTROL) LIQD Use as directed   Blood Glucose Monitoring Suppl (ACCU-CHEK GUIDE) w/Device KIT 1 kit by Does not apply route 3 (three) times daily.   chlorthalidone (HYGROTON) 25 MG tablet TAKE 1 TABLET(25 MG) BY MOUTH DAILY   gabapentin (NEURONTIN) 100 MG capsule Take 1 capsule (100 mg total) by mouth at bedtime.   glucose blood (ACCU-CHEK GUIDE) test strip TEST BLOOD SUGAR THREE TIMES DAILY   Lancets (ACCU-CHEK MULTICLIX) lancets Use to check blood sugars 3 times a day. Dx code: e11.65   metFORMIN (GLUCOPHAGE) 1000 MG tablet TAKE 1 TABLET TWICE DAILY WITH A MEAL   Multiple Vitamin (MULTIVITAMIN) tablet  Take 1 tablet by mouth daily.   pantoprazole (PROTONIX) 40 MG tablet TAKE 1 TABLET ONE TIME DAILY   pravastatin (PRAVACHOL) 80 MG tablet Take 1 tablet (80 mg total) by mouth every evening.   tamsulosin (FLOMAX) 0.4 MG CAPS capsule Take 1 capsule (0.4 mg total) by mouth 2 (two) times daily.   valsartan (DIOVAN) 160 MG tablet TAKE 1 TABLET EVERY DAY   fluticasone (FLONASE) 50 MCG/ACT nasal spray Place 1 spray into both nostrils daily.   VITAMIN D, CHOLECALCIFEROL, PO Take by mouth. (Patient not taking: Reported on 08/09/2020)   No facility-administered encounter medications on file as of 08/09/2020.    Allergies (verified) Amlodipine and Farxiga [dapagliflozin]   History: Past Medical History:  Diagnosis Date   BPH (benign prostatic hyperplasia)    Diabetes mellitus without complication (HCC)    Hyperlipidemia associated with type 2 diabetes mellitus ([facility]    Hypertension    Past Surgical History:  Procedure Laterality Date   JOINT REPLACEMENT Bilateral 2011   knee   Family History  Problem Relation Age of Onset   Thyroid cancer Mother    Cancer Father    Cancer Brother    Social History   Socioeconomic History   Marital status: Married    Spouse name: Not on file   Number of children: 3   Years of education: Not on file   Highest education level: Not on file  Occupational History   Occupation: part time work  Tobacco Use   Smoking status: Former    Packs/day: 1.00  Years: 25.00    Pack years: 25.00    Types: Cigarettes    Quit date: 47    Years since quitting: 23.6   Smokeless tobacco: Former    Types: Chew    Quit date: 2018  Vaping Use   Vaping Use: Never used  Substance and Sexual Activity   Alcohol use: Not Currently   Drug use: Not Currently   Sexual activity: Yes  Other Topics Concern   Not on file  Social History Narrative   Patient reports he works 7 days per week; currently out of work at his M-F job at [employer] due to [employer] pandemic. Patient  reports he is still receiving income and works as a IT trainer. Patient participates in daily exercise walking 7-8 miles per day.   Social Determinants of Health   Financial Resource Strain: Low Risk    Difficulty of Paying Living Expenses: Not hard at all  Food Insecurity: No Food Insecurity   Worried About Charity fundraiser in the Last Year: Never true   Grill in the Last Year: Never true  Transportation Needs: No Transportation Needs   Lack of Transportation (Medical): No   Lack of Transportation (Non-Medical): No  Physical Activity: Sufficiently Active   Days of Exercise per Week: 3 days   Minutes of Exercise per Session: 60 min  Stress: No Stress Concern Present   Feeling of Stress : Not at all  Social Connections: Not on file    Tobacco Counseling Counseling given: Not Answered   Clinical Intake:  Pre-visit preparation completed: Yes  Pain : No/denies pain     Nutritional Status: BMI 25 -29 Overweight Nutritional Risks: None Diabetes: Yes  How often do you need to have someone help you when you read instructions, pamphlets, or other written materials from your doctor or pharmacy?: 1 - Never What is the last grade level you completed in school?: 12th grade  Diabetic? Yes Nutrition Risk Assessment:  Has the patient had any N/V/D within the last 2 months?  No  Does the patient have any non-healing wounds?  No  Has the patient had any unintentional weight loss or weight gain?  No   Diabetes:  Is the patient diabetic?  Yes  If diabetic, was a CBG obtained today?  No  Did the patient bring in their glucometer from home?  No  How often do you monitor your CBG's? 4-5 daily.   Financial Strains and Diabetes Management:  Are you having any financial strains with the device, your supplies or your medication? No .  Does the patient want to be seen by Chronic Care Management for management of their diabetes?  No  Would the patient like to be  referred to a Nutritionist or for Diabetic Management?  No   Diabetic Exams:  Diabetic Eye Exam: Completed 11/26/2019 Diabetic Foot Exam: Overdue, Pt has been advised about the importance in completing this exam. Pt is scheduled for diabetic foot exam on next appointment.   Interpreter Needed?: No  Information entered by :: NAllen LPN   Activities of Daily Living In your present state of health, do you have any difficulty performing the following activities: 08/09/2020  Hearing? Y  Comment a little decreased in left ear  Vision? N  Difficulty concentrating or making decisions? N  Walking or climbing stairs? N  Dressing or bathing? N  Doing errands, shopping? N  Preparing Food and eating ? N  Using the Toilet?  N  In the past six months, have you accidently leaked urine? N  Do you have problems with loss of bowel control? N  Managing your Medications? N  Managing your Finances? N  Housekeeping or managing your Housekeeping? N  Some recent data might be hidden    Patient Care Team: [name], MD as PCP - General (Internal Medicine) Werner Lean, MD as PCP - Cardiology (Cardiology) Rex Kras, Claudette Stapler, RN as Case Manager Mayford Knife, [facility] (Pharmacist)  Indicate any recent Medical Services you may have received from other than [facility] providers in the past year (date may be approximate).     Assessment:   This is a routine wellness examination for Cap.  Hearing/Vision screen Vision Screening - Comments:: Regular eye exams,   Dietary issues and exercise activities discussed: Current Exercise Habits: Home exercise routine, Type of exercise: walking, Time (Minutes): 60, Frequency (Times/Week): 3, Weekly Exercise (Minutes/Week): 180   Goals Addressed             This Visit's Progress    Patient Stated       Patient Stated       08/09/2020, wants to get A1C under 7       Depression Screen PHQ 2/9 Scores 08/09/2020 07/28/2019 07/27/2019 05/21/2018  04/07/2018 03/16/2018 11/24/2017  PHQ - 2 Score 0 0 0 0 0 0 0  PHQ- 9 Score - 1 - 0 - - -    Fall Risk Fall Risk  08/09/2020 07/28/2019 07/27/2019 05/21/2018 03/16/2018  Falls in the past year? 0 0 0 1 1  Number falls in past yr: - - - 0 1  Comment - - - tripped over dog's toy -  Injury with Fall? - - - 0 1  Risk for fall due to : Medication side effect Medication side effect - History of fall(s);Medication side effect -  Follow up Falls evaluation completed;Education provided;Falls prevention discussed Falls evaluation completed;Education provided;Falls prevention discussed - Falls prevention discussed -    FALL RISK PREVENTION PERTAINING TO THE HOME:  Any stairs in or around the home? No  If so, are there any without handrails?  N/a Home free of loose throw rugs in walkways, pet beds, electrical cords, etc? Yes  Adequate lighting in your home to reduce risk of falls? Yes   ASSISTIVE DEVICES UTILIZED TO PREVENT FALLS:  Life alert? No  Use of a cane, walker or w/c? No  Grab bars in the bathroom? No  Shower chair or bench in shower? No  Elevated toilet seat or a handicapped toilet? Yes   TIMED UP AND GO:  Was the test performed? No .    Gait steady and fast without use of assistive device  Cognitive Function:     6CIT Screen 08/09/2020 07/28/2019 05/21/2018  What Year? 0 points 0 points 0 points  What month? 0 points 0 points 0 points  What time? 0 points 0 points 0 points  Count back from 20 0 points 0 points 0 points  Months in reverse 0 points 0 points 0 points  Repeat phrase 4 points 2 points 0 points  Total Score 4 2 0    Immunizations Immunization History  Administered Date(s) Administered   Fluad Quad(high Dose 65+) 09/18/2018, 04/05/2020   Influenza-Unspecified 09/18/2018, 09/30/2018, 11/02/2019   PFIZER(Purple Top)SARS-COV-2 Vaccination 02/13/2019, 03/06/2019, 10/11/2019, 05/05/2020    TDAP status: Due, Education has been provided regarding the importance of this  vaccine. Advised may receive this vaccine at local pharmacy or  Health Dept. Aware to provide a copy of the vaccination record if obtained from local pharmacy or Health Dept. Verbalized acceptance and understanding.  Flu Vaccine status: Due, Education has been provided regarding the importance of this vaccine. Advised may receive this vaccine at local pharmacy or Health Dept. Aware to provide a copy of the vaccination record if obtained from local pharmacy or Health Dept. Verbalized acceptance and understanding.  Pneumococcal vaccine status: Declined,  Education has been provided regarding the importance of this vaccine but patient still declined. Advised may receive this vaccine at local pharmacy or Health Dept. Aware to provide a copy of the vaccination record if obtained from local pharmacy or Health Dept. Verbalized acceptance and understanding.   Covid-19 vaccine status: Completed vaccines  Qualifies for Shingles Vaccine? Yes   Zostavax completed No   Shingrix Completed?: No.    Education has been provided regarding the importance of this vaccine. Patient has been advised to call insurance company to determine out of pocket expense if they have not yet received this vaccine. Advised may also receive vaccine at local pharmacy or Health Dept. Verbalized acceptance and understanding.  Screening Tests Health Maintenance  Topic Date Due   FOOT EXAM  07/27/2020   INFLUENZA VACCINE  08/07/2020   Zoster Vaccines- Shingrix (1 of 2) 11/09/2020 (Originally 02/27/1969)   TETANUS/TDAP  02/01/2021 (Originally 02/27/1969)   PNA vac Low Risk Adult (1 of 2 - PCV13) 02/01/2021 (Originally 02/28/2016)   COVID-19 Vaccine (5 - Booster for Pfizer series) 09/04/2020   OPHTHALMOLOGY EXAM  11/25/2020   HEMOGLOBIN A1C  12/07/2020   COLONOSCOPY (Pts 45-41yr Insurance coverage will need to be confirmed)  03/18/2023   Hepatitis C Screening  Completed   HPV VACCINES  Aged Out    Health Maintenance  Health  Maintenance Due  Topic Date Due   FOOT EXAM  07/27/2020   INFLUENZA VACCINE  08/07/2020    Colorectal cancer screening: Type of screening: Colonoscopy. Completed 03/18/2019. Repeat every 3 years  Lung Cancer Screening: (Low Dose CT Chest recommended if Age 69-80years, 30 pack-year currently smoking OR have quit w/in 15years.) does not qualify.   Lung Cancer Screening Referral: no  Additional Screening:  Hepatitis C Screening: does qualify; Completed 03/16/2018  Vision Screening: Recommended annual ophthalmology exams for early detection of glaucoma and other disorders of the eye. Is the patient up to date with their annual eye exam?  Yes  Who is the provider or what is the name of the office in which the patient attends annual eye exams? Can't remember name If pt is not established with a provider, would they like to be referred to a provider to establish care? No .   Dental Screening: Recommended annual dental exams for proper oral hygiene  Community Resource Referral / Chronic Care Management: CRR required this visit?  No   CCM required this visit?  No      Plan:     I have personally reviewed and noted the following in the patient's chart:   Medical and social history Use of alcohol, tobacco or illicit drugs  Current medications and supplements including opioid prescriptions. Patient is not currently taking opioid prescriptions. Functional ability and status Nutritional status Physical activity Advanced directives List of other physicians Hospitalizations, surgeries, and ER visits in previous 12 months Vitals Screenings to include cognitive, depression, and falls Referrals and appointments  In addition, I have reviewed and discussed with patient certain preventive protocols, quality metrics, and best practice recommendations. A  written personalized care plan for preventive services as well as general preventive health recommendations were provided to patient.      Kellie Simmering, LPN   0/02/2334   Nurse Notes:

## 2020-08-09 NOTE — Patient Instructions (Signed)
Mr. Cassey , Thank you for taking time to come for your Medicare Wellness Visit. I appreciate your ongoing commitment to your health goals. Please review the following plan we discussed and let me know if I can assist you in the future.   Screening recommendations/referrals: Colonoscopy: completed 03/18/2019, due 03/18/2022 Recommended yearly ophthalmology/optometry visit for glaucoma screening and checkup Recommended yearly dental visit for hygiene and checkup  Vaccinations: Influenza vaccine: due Pneumococcal vaccine: decline Tdap vaccine: decline Shingles vaccine: discussed   Covid-19:  05/05/2020, 10/11/2019, 03/06/2019, 02/13/2019  Advanced directives: Advance directive discussed with you today. I have provided a copy for you to complete at home and have notarized. Once this is complete please bring a copy in to our office so we can scan it into your chart.  Conditions/risks identified: none  Next appointment: Follow up in one year for your annual wellness visit.   Preventive Care 29 Years and Older, Male Preventive care refers to lifestyle choices and visits with your health care provider that can promote health and wellness. What does preventive care include? A yearly physical exam. This is also called an annual well check. Dental exams once or twice a year. Routine eye exams. Ask your health care provider how often you should have your eyes checked. Personal lifestyle choices, including: Daily care of your teeth and gums. Regular physical activity. Eating a healthy diet. Avoiding tobacco and drug use. Limiting alcohol use. Practicing safe sex. Taking low doses of aspirin every day. Taking vitamin and mineral supplements as recommended by your health care provider. What happens during an annual well check? The services and screenings done by your health care provider during your annual well check will depend on your age, overall health, lifestyle risk factors, and family  history of disease. Counseling  Your health care provider may ask you questions about your: Alcohol use. Tobacco use. Drug use. Emotional well-being. Home and relationship well-being. Sexual activity. Eating habits. History of falls. Memory and ability to understand (cognition). Work and work Statistician. Screening  You may have the following tests or measurements: Height, weight, and BMI. Blood pressure. Lipid and cholesterol levels. These may be checked every 5 years, or more frequently if you are over 78 years old. Skin check. Lung cancer screening. You may have this screening every year starting at age 58 if you have a 30-pack-year history of smoking and currently smoke or have quit within the past 15 years. Fecal occult blood test (FOBT) of the stool. You may have this test every year starting at age 61. Flexible sigmoidoscopy or colonoscopy. You may have a sigmoidoscopy every 5 years or a colonoscopy every 10 years starting at age 31. Prostate cancer screening. Recommendations will vary depending on your family history and other risks. Hepatitis C blood test. Hepatitis B blood test. Sexually transmitted disease (STD) testing. Diabetes screening. This is done by checking your blood sugar (glucose) after you have not eaten for a while (fasting). You may have this done every 1-3 years. Abdominal aortic aneurysm (AAA) screening. You may need this if you are a current or former smoker. Osteoporosis. You may be screened starting at age 38 if you are at high risk. Talk with your health care provider about your test results, treatment options, and if necessary, the need for more tests. Vaccines  Your health care provider may recommend certain vaccines, such as: Influenza vaccine. This is recommended every year. Tetanus, diphtheria, and acellular pertussis (Tdap, Td) vaccine. You may need a Td booster every  10 years. Zoster vaccine. You may need this after age 33. Pneumococcal  13-valent conjugate (PCV13) vaccine. One dose is recommended after age 83. Pneumococcal polysaccharide (PPSV23) vaccine. One dose is recommended after age 33. Talk to your health care provider about which screenings and vaccines you need and how often you need them. This information is not intended to replace advice given to you by your health care provider. Make sure you discuss any questions you have with your health care provider. Document Released: 01/20/2015 Document Revised: 09/13/2015 Document Reviewed: 10/25/2014 Elsevier Interactive Patient Education  2017 Elim Prevention in the Home Falls can cause injuries. They can happen to people of all ages. There are many things you can do to make your home safe and to help prevent falls. What can I do on the outside of my home? Regularly fix the edges of walkways and driveways and fix any cracks. Remove anything that might make you trip as you walk through a door, such as a raised step or threshold. Trim any bushes or trees on the path to your home. Use bright outdoor lighting. Clear any walking paths of anything that might make someone trip, such as rocks or tools. Regularly check to see if handrails are loose or broken. Make sure that both sides of any steps have handrails. Any raised decks and porches should have guardrails on the edges. Have any leaves, snow, or ice cleared regularly. Use sand or salt on walking paths during winter. Clean up any spills in your garage right away. This includes oil or grease spills. What can I do in the bathroom? Use night lights. Install grab bars by the toilet and in the tub and shower. Do not use towel bars as grab bars. Use non-skid mats or decals in the tub or shower. If you need to sit down in the shower, use a plastic, non-slip stool. Keep the floor dry. Clean up any water that spills on the floor as soon as it happens. Remove soap buildup in the tub or shower regularly. Attach  bath mats securely with double-sided non-slip rug tape. Do not have throw rugs and other things on the floor that can make you trip. What can I do in the bedroom? Use night lights. Make sure that you have a light by your bed that is easy to reach. Do not use any sheets or blankets that are too big for your bed. They should not hang down onto the floor. Have a firm chair that has side arms. You can use this for support while you get dressed. Do not have throw rugs and other things on the floor that can make you trip. What can I do in the kitchen? Clean up any spills right away. Avoid walking on wet floors. Keep items that you use a lot in easy-to-reach places. If you need to reach something above you, use a strong step stool that has a grab bar. Keep electrical cords out of the way. Do not use floor polish or wax that makes floors slippery. If you must use wax, use non-skid floor wax. Do not have throw rugs and other things on the floor that can make you trip. What can I do with my stairs? Do not leave any items on the stairs. Make sure that there are handrails on both sides of the stairs and use them. Fix handrails that are broken or loose. Make sure that handrails are as long as the stairways. Check any carpeting to make  sure that it is firmly attached to the stairs. Fix any carpet that is loose or worn. Avoid having throw rugs at the top or bottom of the stairs. If you do have throw rugs, attach them to the floor with carpet tape. Make sure that you have a light switch at the top of the stairs and the bottom of the stairs. If you do not have them, ask someone to add them for you. What else can I do to help prevent falls? Wear shoes that: Do not have high heels. Have rubber bottoms. Are comfortable and fit you well. Are closed at the toe. Do not wear sandals. If you use a stepladder: Make sure that it is fully opened. Do not climb a closed stepladder. Make sure that both sides of the  stepladder are locked into place. Ask someone to hold it for you, if possible. Clearly mark and make sure that you can see: Any grab bars or handrails. First and last steps. Where the edge of each step is. Use tools that help you move around (mobility aids) if they are needed. These include: Canes. Walkers. Scooters. Crutches. Turn on the lights when you go into a dark area. Replace any light bulbs as soon as they burn out. Set up your furniture so you have a clear path. Avoid moving your furniture around. If any of your floors are uneven, fix them. If there are any pets around you, be aware of where they are. Review your medicines with your doctor. Some medicines can make you feel dizzy. This can increase your chance of falling. Ask your doctor what other things that you can do to help prevent falls. This information is not intended to replace advice given to you by your health care provider. Make sure you discuss any questions you have with your health care provider. Document Released: 10/20/2008 Document Revised: 06/01/2015 Document Reviewed: 01/28/2014 Elsevier Interactive Patient Education  2017 [employer].

## 2020-08-11 ENCOUNTER — Telehealth: Payer: Medicare HMO

## 2020-08-22 DIAGNOSIS — R972 Elevated prostate specific antigen [PSA]: Secondary | ICD-10-CM | POA: Diagnosis not present

## 2020-08-24 ENCOUNTER — Telehealth: Payer: Self-pay

## 2020-08-24 ENCOUNTER — Other Ambulatory Visit: Payer: Self-pay

## 2020-08-24 DIAGNOSIS — E782 Mixed hyperlipidemia: Secondary | ICD-10-CM

## 2020-08-24 LAB — HEPATIC FUNCTION PANEL
ALT: 15 IU/L (ref 0–44)
AST: 16 IU/L (ref 0–40)
Albumin: 4.4 g/dL (ref 3.8–4.8)
Alkaline Phosphatase: 80 IU/L (ref 44–121)
Bilirubin Total: 0.2 mg/dL (ref 0.0–1.2)
Bilirubin, Direct: <0.1 mg/dL (ref 0.00–0.40)
Total Protein: 7.4 g/dL (ref 6.0–8.5)

## 2020-08-24 LAB — LIPID PANEL
Chol/HDL Ratio: 3.4 ratio (ref 0.0–5.0)
Cholesterol, Total: 142 mg/dL (ref 100–199)
HDL: 42 mg/dL (ref >39)
LDL Chol Calc (NIH): 83 mg/dL (ref 0–99)
Triglycerides: 88 mg/dL (ref 0–149)
VLDL Cholesterol Cal: 17 mg/dL (ref 5–40)

## 2020-08-24 NOTE — Chronic Care Management (AMB) (Signed)
  Chronic Care Management Pharmacy Assistant   Name: Kelly Lambert  MRN: [id number] DOB: 07/15/1950   Reason for Encounter: Disease State/  Recent office visits:  08-09-2020 Allen, Nickeah E, LPN. Medicare wellness  Recent consult visits:  None  Hospital visits:  None in previous 6 months  Medications: Outpatient Encounter Medications as of 08/24/2020  Medication Sig   Ascorbic Acid (VITAMIN C PO) Take by mouth.   aspirin EC 81 MG tablet Take 81 mg by mouth daily.   Blood Glucose Calibration (ACCU-CHEK GUIDE CONTROL) LIQD Use as directed   Blood Glucose Monitoring Suppl (ACCU-CHEK GUIDE) w/Device KIT 1 kit by Does not apply route 3 (three) times daily.   chlorthalidone (HYGROTON) 25 MG tablet TAKE 1 TABLET(25 MG) BY MOUTH DAILY   fluticasone (FLONASE) 50 MCG/ACT nasal spray Place 1 spray into both nostrils daily.   gabapentin (NEURONTIN) 100 MG capsule Take 1 capsule (100 mg total) by mouth at bedtime.   glucose blood (ACCU-CHEK GUIDE) test strip TEST BLOOD SUGAR THREE TIMES DAILY   Lancets (ACCU-CHEK MULTICLIX) lancets Use to check blood sugars 3 times a day. Dx code: e11.65   metFORMIN (GLUCOPHAGE) 1000 MG tablet TAKE 1 TABLET TWICE DAILY WITH A MEAL   Multiple Vitamin (MULTIVITAMIN) tablet Take 1 tablet by mouth daily.   pantoprazole (PROTONIX) 40 MG tablet TAKE 1 TABLET ONE TIME DAILY   pravastatin (PRAVACHOL) 80 MG tablet Take 1 tablet (80 mg total) by mouth every evening.   tamsulosin (FLOMAX) 0.4 MG CAPS capsule Take 1 capsule (0.4 mg total) by mouth 2 (two) times daily.   valsartan (DIOVAN) 160 MG tablet TAKE 1 TABLET EVERY DAY   VITAMIN D, CHOLECALCIFEROL, PO Take by mouth. (Patient not taking: Reported on 08/09/2020)   No facility-administered encounter medications on file as of 08/24/2020.  Reviewed chart prior to disease state call. Spoke with patient regarding BP  Recent Office Vitals: BP Readings from Last 3 Encounters:  08/09/20 108/68  06/07/20 112/70   05/25/20 120/78   Pulse Readings from Last 3 Encounters:  08/09/20 93  06/07/20 90  05/25/20 67    Wt Readings from Last 3 Encounters:  08/09/20 200 lb 3.2 oz (90.8 kg)  06/07/20 202 lb 9.6 oz (91.9 kg)  05/25/20 206 lb (93.4 kg)     Kidney Function Lab Results  Component Value Date/Time   CREATININE 1.21 05/24/2020 10:05 AM   CREATININE 1.16 02/14/2020 09:26 AM   GFRNONAA 64 02/14/2020 09:26 AM   GFRAA 74 02/14/2020 09:26 AM    BMP Latest Ref Rng & Units 05/24/2020 02/14/2020 12/08/2019  Glucose 65 - 99 mg/dL 107(H) 116(H) 90  BUN 8 - 27 mg/dL 18 16 11  Creatinine 0.76 - 1.27 mg/dL 1.21 1.16 1.03  BUN/Creat Ratio 10 - 24 15 14 11  Sodium 134 - 144 mmol/L 139 137 140  Potassium 3.5 - 5.2 mmol/L 4.2 4.0 4.8  Chloride 96 - 106 mmol/L 100 99 102  CO2 20 - 29 mmol/L 23 24 25  Calcium 8.6 - 10.2 mg/dL 10.1 10.2 10.3(H)    Current antihypertensive regimen:  Chlorthalidone 25 mg tablet once per day Valsartan 160 mg tablet once per day   How often are you checking your Blood Pressure? daily Current home BP readings: 106/74 What recent interventions/DTPs have been made by any provider to improve Blood Pressure control since last CPP Visit:  Educated on BP goals and benefits of medications for prevention of heart attack, stroke and kidney damage Daily salt   intake goal < 2300 mg Importance of home blood pressure monitoring  Any recent hospitalizations or ED visits since last visit with CPP? No What diet changes have been made to improve Blood Pressure Control?  Patient states he has limited salt intake and drinks 4-5 bottles of water daily  What exercise is being done to improve your Blood Pressure Control?  Patient states he walks daily  Adherence Review: Is the patient currently on ACE/ARB medication? Yes Does the patient have >5 day gap between last estimated fill dates? No  NOTES: Patient states he has been doing great with his health and blood pressure. Informed  patient I will call again next month and he is aware of telephone visit with Vallie Pearson in November.  Care Gaps: Foot exam overdue Medicare wellness 08-27-2021 RAF= 0.514%  Star Rating Drugs: Metformin 1000 mg- Last filled 05-31-2020 [facility] Pravastatin 40 mg- Last filled 05-26-2020 [facility] Valsartan 160 mg- Last filled 05-30-2020 [facility]  Malecca Hicks CMA Clinical Pharmacist Assistant [phone]  

## 2020-08-28 ENCOUNTER — Telehealth: Payer: Self-pay

## 2020-08-28 DIAGNOSIS — E782 Mixed hyperlipidemia: Secondary | ICD-10-CM

## 2020-08-28 MED ORDER — ROSUVASTATIN CALCIUM 10 MG PO TABS: 10.0000 mg | ORAL_TABLET | Freq: Every day | ORAL | 3 refills | Status: DC

## 2020-08-28 NOTE — Telephone Encounter (Signed)
-----   Message from Werner Lean, MD sent at 08/24/2020  5:22 PM EDT ----- Results: LDL still above goal Plan: Offer transition from pravastatin to rosuvastatin 10 mg and lipids in three months  Werner Lean, MD

## 2020-08-28 NOTE — Telephone Encounter (Signed)
The patient has been notified of the result and verbalized understanding.  All questions (if any) were answered.  Orders placed.  Precious Gilding, RN 08/28/2020 10:41 AM

## 2020-08-29 ENCOUNTER — Telehealth: Payer: Medicare HMO

## 2020-08-29 ENCOUNTER — Ambulatory Visit (INDEPENDENT_AMBULATORY_CARE_PROVIDER_SITE_OTHER): Payer: Medicare HMO

## 2020-08-29 ENCOUNTER — Telehealth: Payer: Self-pay

## 2020-08-29 DIAGNOSIS — E1122 Type 2 diabetes mellitus with diabetic chronic kidney disease: Secondary | ICD-10-CM

## 2020-08-29 DIAGNOSIS — I129 Hypertensive chronic kidney disease with stage 1 through stage 4 chronic kidney disease, or unspecified chronic kidney disease: Secondary | ICD-10-CM | POA: Diagnosis not present

## 2020-08-29 DIAGNOSIS — I2510 Atherosclerotic heart disease of native coronary artery without angina pectoris: Secondary | ICD-10-CM

## 2020-08-29 DIAGNOSIS — N182 Chronic kidney disease, stage 2 (mild): Secondary | ICD-10-CM

## 2020-08-29 DIAGNOSIS — E559 Vitamin D deficiency, unspecified: Secondary | ICD-10-CM

## 2020-08-29 NOTE — Telephone Encounter (Signed)
  Care Management   Follow Up Note   08/29/2020 Name: Kelly Lambert MRN: [id number] DOB: [birth year]   Referred by: [name], MD Reason for referral : Chronic Care Management (RN CM Follow up call - 2nd attempt )   A second unsuccessful telephone outreach was attempted today. The patient was referred to the case management team for assistance with care management and care coordination.   Follow Up Plan: A HIPPA compliant phone message was left for the patient providing contact information and requesting a return call.   Barb Merino, RN, BSN, CCM Care Management Coordinator [place] [facility]  Direct Phone: [phone]

## 2020-08-31 DIAGNOSIS — N401 Enlarged prostate with lower urinary tract symptoms: Secondary | ICD-10-CM | POA: Diagnosis not present

## 2020-08-31 DIAGNOSIS — R3912 Poor urinary stream: Secondary | ICD-10-CM | POA: Diagnosis not present

## 2020-08-31 DIAGNOSIS — R972 Elevated prostate specific antigen [PSA]: Secondary | ICD-10-CM | POA: Diagnosis not present

## 2020-09-01 DIAGNOSIS — E119 Type 2 diabetes mellitus without complications: Secondary | ICD-10-CM | POA: Diagnosis not present

## 2020-09-02 LAB — HM DIABETES EYE EXAM

## 2020-09-05 ENCOUNTER — Encounter: Payer: Self-pay | Admitting: Internal Medicine

## 2020-09-06 NOTE — Patient Instructions (Signed)
Goals Addressed      Coronary Artery disease complications prevented or minimized   On track    Timeframe:  Long-Range Goal Priority:  High Start Date:  08/29/20                          Expected End Date:  08/29/21   Next scheduled follow up date: 10/26/20        Patient Goals: - adhere to dietary and exercise recommendations - take medicaitons exactly as prescribed                Diabetes - Glycemic Management Optimized   On track    Timeframe:  Long-Range Goal Priority:  High Start Date:  06/12/20                          Expected End Date:  06/12/21  Next Scheduled Follow up date: 10/26/20   Self-Care Activities Self administers oral medications as prescribed Attends all scheduled provider appointments Checks blood sugars as prescribed and utilize hyper and hypoglycemia protocol as needed Adheres to prescribed ADA/carb modified Patient Goals: - check blood sugar at prescribed times - check blood sugar before and after exercise - check blood sugar if I feel it is too high or too low - enter blood sugar readings and medication or insulin into daily log - take the blood sugar log to all doctor visits - take the blood sugar meter to all doctor visits - manage portion size      Track and Manage My Blood Pressure-Hypertension   On track    Timeframe:  Long-Range Goal Priority:  High Start Date: 02/09/20                            Expected End Date: 02/08/21                     Next Scheduled Follow Up Date: 10/26/20  Patient Self-Care Activities:   Self administers medications as prescribed without missed doses Attend all scheduled provider appointments Call pharmacy for medication refills Call provider office for new concerns or questions Continue to monitor BP at home and record readings, notify a member of your health care team of abnormal readings Adhere to low Sodium diet and exercise recommendations   Why is this important?   You won't feel high blood pressure, but it  can still hurt your blood vessels.  High blood pressure can cause heart or kidney problems. It can also cause a stroke.  Making lifestyle changes like losing a Kelly Lambert weight or eating less salt will help.  Checking your blood pressure at home and at different times of the day can help to control blood pressure.  If the doctor prescribes medicine remember to take it the way the doctor ordered.  Call the office if you cannot afford the medicine or if there are questions about it.     Notes:      Vitamin D deficiency - treatment optimized   On track    Timeframe:  Long-Range Goal Priority:  High Start Date:  06/12/20                          Expected End Date:   06/12/21  Next Scheduled Follow up date: 08/11/20       Self Care Activities:  Continue to keep all scheduled follow up appointments Take medications as directed  Let your healthcare team know if you are unable to take your medications Call your pharmacy for refills at least 7 days prior to running out of medication Patient Goals: -increase water to 64 oz daily -get 15 minutes of natural sunlight when possible -eat vitamin d rich foods

## 2020-09-06 NOTE — Chronic Care Management (AMB) (Signed)
Chronic Care Management   CCM RN Visit Note  08/29/2020 Name: Kelly Lambert MRN: [id number] DOB: 1950-05-18  Subjective: Kelly Lambert is a 70 y.o. year old male who is a primary care patient of [name], MD. The care management team was consulted for assistance with disease management and care coordination needs.    Engaged with patient by telephone for follow up visit in response to provider referral for case management and/or care coordination services.   Consent to Services:  The patient was given information about Chronic Care Management services, agreed to services, and gave verbal consent prior to initiation of services.  Please see initial visit note for detailed documentation.   Patient agreed to services and verbal consent obtained.   Assessment: Review of patient past medical history, allergies, medications, health status, including review of consultants reports, laboratory and other test data, was performed as part of comprehensive evaluation and provision of chronic care management services.   SDOH (Social Determinants of Health) assessments and interventions performed:    CCM Care Plan  Allergies  Allergen Reactions   Amlodipine Other (See Comments)    Headaches    Farxiga [Dapagliflozin] Other (See Comments)    headaches    Outpatient Encounter Medications as of 08/29/2020  Medication Sig   Ascorbic Acid (VITAMIN C PO) Take by mouth.   aspirin EC 81 MG tablet Take 81 mg by mouth daily.   Blood Glucose Calibration (ACCU-CHEK GUIDE CONTROL) LIQD Use as directed   Blood Glucose Monitoring Suppl (ACCU-CHEK GUIDE) w/Device KIT 1 kit by Does not apply route 3 (three) times daily.   chlorthalidone (HYGROTON) 25 MG tablet TAKE 1 TABLET(25 MG) BY MOUTH DAILY   gabapentin (NEURONTIN) 100 MG capsule Take 1 capsule (100 mg total) by mouth at bedtime.   glucose blood (ACCU-CHEK GUIDE) test strip TEST BLOOD SUGAR THREE TIMES DAILY   Lancets (ACCU-CHEK MULTICLIX)  lancets Use to check blood sugars 3 times a day. Dx code: e11.65   metFORMIN (GLUCOPHAGE) 1000 MG tablet TAKE 1 TABLET TWICE DAILY WITH A MEAL   Multiple Vitamin (MULTIVITAMIN) tablet Take 1 tablet by mouth daily.   pantoprazole (PROTONIX) 40 MG tablet TAKE 1 TABLET ONE TIME DAILY   rosuvastatin (CRESTOR) 10 MG tablet Take 1 tablet (10 mg total) by mouth daily.   tamsulosin (FLOMAX) 0.4 MG CAPS capsule Take 1 capsule (0.4 mg total) by mouth 2 (two) times daily.   valsartan (DIOVAN) 160 MG tablet TAKE 1 TABLET EVERY DAY   VITAMIN D, CHOLECALCIFEROL, PO Take by mouth. (Patient not taking: Reported on 08/09/2020)   No facility-administered encounter medications on file as of 08/29/2020.    Patient Active Problem List   Diagnosis Date Noted   PSVT (paroxysmal supraventricular tachycardia) ([place]) 05/25/2020   Coronary artery disease involving native coronary artery of native heart without angina pectoris 05/25/2020   Class 1 obesity due to excess calories without serious comorbidity with body mass index (BMI) of 30.0 to 30.9 in adult 04/24/2020   DOE (dyspnea on exertion) 02/04/2020   Diabetes mellitus with coincident hypertension ([place]) 02/04/2020   Mixed hyperlipidemia 02/04/2020   Palpitations 02/04/2020   Personal history of colonic polyps 03/01/2019   Type 2 diabetes mellitus with stage 2 chronic kidney disease, without long-term current use of insulin ([place]) 03/01/2019   Diabetic mononeuropathy associated with type 2 diabetes mellitus ([place]) 11/24/2017   Hypertensive nephropathy 11/24/2017   Chronic renal disease, stage II 11/24/2017    Conditions to be addressed/monitored: DM, HTN, Vitamin  D deficiency, HLD   Care Plan : Hypertension (Adult)  Updates made by Lynne Logan, RN since 08/29/2020 12:00 AM     Problem: Hypertension (Hypertension)   Priority: High     Long-Range Goal: Hypertension Monitored   Start Date: 02/09/2020  Expected End Date: 02/08/2021  Recent Progress: On track   Priority: High  Note:   Objective:  Last practice recorded BP readings:  BP Readings from Last 3 Encounters:  08/09/20 108/68  06/07/20 112/70  05/25/20 120/78   Most recent eGFR/CrCl:  Lab Results  Component Value Date   EGFR 65 05/24/2020    No components found for: CRCL Current Barriers:  Knowledge Deficits related to basic understanding of hypertension pathophysiology and self care management Knowledge Deficits related to understanding of medications prescribed for management of hypertension Case Manager Clinical Goal(s):  patient will demonstrate improved adherence to prescribed treatment plan for hypertension as evidenced by taking all medications as prescribed, monitoring and recording blood pressure as directed, adhering to low sodium/DASH diet Interventions:  08/29/20 completed successful outbound call with patient  Collaboration with [name], MD regarding development and update of comprehensive plan of care as evidenced by provider attestation and co-signature Inter-disciplinary care team collaboration (see longitudinal plan of care) Evaluation of current treatment plan related to hypertension self management and patient's adherence to plan as established by provider. Provided education to patient re: stroke prevention, s/s of heart attack and stroke, DASH diet, complications of uncontrolled blood pressure Reviewed medications with patient and discussed importance of compliance Advised patient, providing education and rationale, to monitor blood pressure daily and record, calling PCP for findings outside established parameters.  Discussed plans with patient for ongoing care management follow up and provided patient with direct contact information for care management team Self-Care Activities: Self administers medications as prescribed Attends all scheduled provider appointments Calls provider office for new concerns, questions, or BP outside discussed  parameters Checks BP and records as discussed Follows a low sodium diet/DASH diet Patient Goals: - check blood pressure 3 times per week - learn about high blood pressure  Follow Up Plan: Telephone follow up appointment with care management team member scheduled for: 10/26/20     Care Plan : Diabetes Type 2 (Adult)  Updates made by Lynne Logan, RN since 08/29/2020 12:00 AM     Problem: Glycemic Management (Diabetes, Type 2)   Priority: High     Long-Range Goal: Glycemic Management Optimized   Start Date: 06/12/2020  Expected End Date: 06/12/2021  Recent Progress: On track  Priority: High  Note:   Objective:  Lab Results  Component Value Date   HGBA1C 7.4 (H) 06/07/2020   Lab Results  Component Value Date   CREATININE 1.21 05/24/2020   CREATININE 1.16 02/14/2020   CREATININE 1.03 12/08/2019   Lab Results  Component Value Date   EGFR 65 05/24/2020  Current Barriers:  Knowledge Deficits related to basic Diabetes pathophysiology and self care/management Knowledge Deficits related to medications used for management of diabetes Case Manager Clinical Goal(s):  patient will demonstrate improved adherence to prescribed treatment plan for diabetes self care/management as evidenced by: daily monitoring and recording of CBG  adherence to ADA/ carb modified diet exercise 5 days/week adherence to prescribed medication regimen contacting provider for new or worsened symptoms or questions Interventions:  08/29/20 completed successful outbound call with patient  Collaboration with [name], MD regarding development and update of comprehensive plan of care as evidenced by provider attestation and co-signature Inter-disciplinary  care team collaboration (see longitudinal plan of care) Provided education to patient about basic DM disease process Review of patient status, including review of consultants reports, relevant laboratory and other test results, and medications  completed. Reviewed medications with patient and discussed importance of medication adherence; Determined PCP is recommending Wilder Glade and patient is agreeable Advised patient, providing education and rationale, to check cbg daily before meals and at bedtime and record, calling PCP and or CCM team for findings outside established parameters.   Mailed printed educational materials related to Low Carb Smoothies; Mediterranean diet  Discussed plans with patient for ongoing care management follow up and provided patient with direct contact information for care management team Self-Care Activities Self administers oral medications as prescribed Attends all scheduled provider appointments Checks blood sugars as prescribed and utilize hyper and hypoglycemia protocol as needed Adheres to prescribed ADA/carb modified Patient Goals: - check blood sugar at prescribed times - check blood sugar before and after exercise - check blood sugar if I feel it is too high or too low - enter blood sugar readings and medication or insulin into daily log - take the blood sugar log to all doctor visits - take the blood sugar meter to all doctor visits - manage portion size - keep appointment with eye doctor - schedule appointment with eye doctor - check feet daily for cuts, sores or redness - do heel pump exercise 2 to 3 times each day - keep feet up while sitting - trim toenails straight across - wash and dry feet carefully every day - wear comfortable, cotton socks - wear comfortable, well-fitting shoes  Follow Up Plan: Telephone follow up appointment with care management team member scheduled for: 10/26/20     Care Plan : Vitamin D deficiency  Updates made by Lynne Logan, RN since 08/29/2020 12:00 AM     Problem: Vitamin D deficiency   Priority: High     Long-Range Goal: Vitamin D deficiency - treatment optimized   Start Date: 06/12/2020  Expected End Date: 06/12/2021  Recent Progress: On track   Priority: High  Note:   Current Barriers:  Ineffective Self Health Maintenance  Clinical Goal(s):  Collaboration with [name], MD regarding development and update of comprehensive plan of care as evidenced by provider attestation and co-signature Inter-disciplinary care team collaboration (see longitudinal plan of care) patient will work with care management team to address care coordination and chronic disease management needs related to Disease Management Educational Needs Care Coordination Medication Management and Education Psychosocial Support   Interventions:  08/29/20 completed successful outbound call with patient  Evaluation of current treatment plan related to  Vitamin D deficiency , self-management and patient's adherence to plan as established by provider. Collaboration with [name], MD regarding development and update of comprehensive plan of care as evidenced by provider attestation       and co-signature Inter-disciplinary care team collaboration (see longitudinal plan of care) Provided education to patient about basic disease process related to Vitamin D deficiency  Review of patient status, including review of consultant's reports, relevant laboratory and other test results, and medications completed. Reviewed medications with patient and discussed importance of medication adherence Educated on importance of eating Vitamin D rich foods and getting at least 15 minutes of natural sunlight when possible  Discussed plans with patient for ongoing care management follow up and provided patient with direct contact information for care management team Self Care Activities:  Continue to keep all scheduled follow up appointments Take  medications as directed  Let your healthcare team know if you are unable to take your medications Call your pharmacy for refills at least 7 days prior to running out of medication Eat Vitamin D rich foods Get atleast 15 minutes of  natural sunlight when possible  Patient Goals: - to improve Vitamin D level to optimal health   Follow Up Plan: Telephone follow up appointment with care management team member scheduled for: 10/26/20     Care Plan : Coronary Artery disease  Updates made by Lynne Logan, RN since 08/29/2020 12:00 AM     Problem: Coronary artery disease   Priority: High     Long-Range Goal: Coronary Artery disease complications prevented or minimized   Start Date: 08/29/2020  Expected End Date: 08/29/2020  This Visit's Progress: On track  Priority: High  Note:   Current Barriers:  Ineffective Self Health Maintenance in a patient with DM, HTN, Vitamin D deficiency, HLD  Clinical Goal(s):  Collaboration with [name], MD regarding development and update of comprehensive plan of care as evidenced by provider attestation and co-signature Inter-disciplinary care team collaboration (see longitudinal plan of care) patient will work with care management team to address care coordination and chronic disease management needs related to Disease Management Educational Needs Care Coordination Medication Management and Education Medication Reconciliation Psychosocial Support   Interventions: 08/29/20 completed successful outbound call with patient   Evaluation of current treatment plan related to DM, HTN, Vitamin D deficiency, HLD , self-management and patient's adherence to plan as established by provider. Collaboration with [name], MD regarding development and update of comprehensive plan of care as evidenced by provider attestation       and co-signature Inter-disciplinary care team collaboration (see longitudinal plan of care) Provided education to patient about basic disease process related to Hyperlipidemia Review of patient status, including review of consultant's reports, relevant laboratory and other test results, and medications completed. Reviewed medications with patient and  discussed importance of medication adherence Confirmed patient verbalizes understanding about his recent medication change per Dr. Gasper Sells regarding the switch from pravastatin to rosuvastatin 10 mg and lipids in three months Educated patient on dietary and exercise recommendations  Mailed printed educational materials related to Managing Cholesterol  Discussed plans with patient for ongoing care management follow up and provided patient with direct contact information for care management team Self Care Activities:  Self administers medications as prescribed Attends all scheduled provider appointments Calls pharmacy for medication refills Calls provider office for new concerns or questions Patient Goals: - adhere to dietary and exercise recommendations - take medicaitons exactly as prescribed  Follow Up Plan: Telephone follow up appointment with care management team member scheduled for: 10/26/20     Plan:Telephone follow up appointment with care management team member scheduled for:  10/26/20  Barb Merino, RN, BSN, CCM Care Management Coordinator [place] [facility]  Direct Phone: [phone]

## 2020-09-21 ENCOUNTER — Telehealth: Payer: Medicare HMO

## 2020-10-03 ENCOUNTER — Telehealth: Payer: Self-pay

## 2020-10-03 NOTE — Telephone Encounter (Signed)
I returned the pt's call and rescheduled his appt.

## 2020-10-12 ENCOUNTER — Encounter: Payer: Self-pay | Admitting: Nurse Practitioner

## 2020-10-12 ENCOUNTER — Other Ambulatory Visit: Payer: Self-pay

## 2020-10-12 ENCOUNTER — Ambulatory Visit (INDEPENDENT_AMBULATORY_CARE_PROVIDER_SITE_OTHER): Payer: Medicare HMO | Admitting: Nurse Practitioner

## 2020-10-12 VITALS — BP 128/70 | HR 70 | Temp 97.6°F | Ht 71.0 in | Wt 202.0 lb

## 2020-10-12 DIAGNOSIS — I129 Hypertensive chronic kidney disease with stage 1 through stage 4 chronic kidney disease, or unspecified chronic kidney disease: Secondary | ICD-10-CM | POA: Diagnosis not present

## 2020-10-12 DIAGNOSIS — E559 Vitamin D deficiency, unspecified: Secondary | ICD-10-CM

## 2020-10-12 DIAGNOSIS — Z6828 Body mass index (BMI) 28.0-28.9, adult: Secondary | ICD-10-CM | POA: Diagnosis not present

## 2020-10-12 DIAGNOSIS — N182 Chronic kidney disease, stage 2 (mild): Secondary | ICD-10-CM

## 2020-10-12 DIAGNOSIS — E1122 Type 2 diabetes mellitus with diabetic chronic kidney disease: Secondary | ICD-10-CM | POA: Diagnosis not present

## 2020-10-12 DIAGNOSIS — Z0000 Encounter for general adult medical examination without abnormal findings: Secondary | ICD-10-CM

## 2020-10-12 DIAGNOSIS — R634 Abnormal weight loss: Secondary | ICD-10-CM | POA: Diagnosis not present

## 2020-10-12 DIAGNOSIS — E663 Overweight: Secondary | ICD-10-CM

## 2020-10-12 NOTE — Progress Notes (Signed)
I,Tianna Badgett,acting as a Education administrator for [employer], NP.,have documented all relevant documentation on the behalf of [employer], NP,as directed by  Bary Castilla, NP while in the presence of Bary Castilla, NP.  This visit occurred during the SARS-CoV-2 public health emergency.  Safety protocols were in place, including screening questions prior to the visit, additional usage of staff PPE, and extensive cleaning of exam room while observing appropriate contact time as indicated for disinfecting solutions.  Subjective:     Patient ID: Kelly Lambert , male    DOB: 1950/02/17 , 70 y.o.   MRN: [id number]   Chief Complaint  Patient presents with   Annual Exam    HPI  He is here today for a full physical examination. He is followed by Urology for prostate exams. He has no specific concerns or complaints at this time. He is also sees a cardiologist.  Diet: He is working on his diet. He has the cereal and oatmeal. And during lunch he has glucerna shake. He eats healthy for dinner he likes the chicken and fish.  Exercise: He walks a lot  Drink or smoke: he does have a cigar once a while.   Diabetes He presents for his follow-up diabetic visit. He has type 2 diabetes mellitus. There are no hypoglycemic associated symptoms. Pertinent negatives for hypoglycemia include no headaches. Pertinent negatives for diabetes include no blurred vision, no chest pain and no weakness. There are no hypoglycemic complications. Diabetic complications include nephropathy. Risk factors for coronary artery disease include diabetes mellitus, dyslipidemia, hypertension and male sex. He is compliant with treatment most of the time. He is following a diabetic diet. Meal planning includes avoidance of concentrated sweets. He participates in exercise intermittently. His home blood glucose trend is fluctuating minimally. His breakfast blood glucose is taken between 9-10 am. His breakfast blood glucose range  is generally 110-130 mg/dl. An ACE inhibitor/angiotensin II receptor blocker is being taken. Eye exam is current.  Hypertension This is a chronic problem. The current episode started more than 1 year ago. The problem has been gradually improving since onset. The problem is controlled. Pertinent negatives include no blurred vision, chest pain, headaches, palpitations or shortness of breath. Risk factors for coronary artery disease include diabetes mellitus, dyslipidemia and male gender. Past treatments include angiotensin blockers and diuretics. The current treatment provides moderate improvement. Compliance problems include exercise.     Past Medical History:  Diagnosis Date   BPH (benign prostatic hyperplasia)    Diabetes mellitus without complication ([place])    Hyperlipidemia associated with type 2 diabetes mellitus ([place])    Hypertension      Family History  Problem Relation Age of Onset   Thyroid cancer Mother    Cancer Father    Cancer Brother      Current Outpatient Medications:    Ascorbic Acid (VITAMIN C PO), Take by mouth., Disp: , Rfl:    aspirin EC 81 MG tablet, Take 81 mg by mouth daily., Disp: , Rfl:    Blood Glucose Calibration (ACCU-CHEK GUIDE CONTROL) LIQD, Use as directed, Disp: 1 each, Rfl: 1   Blood Glucose Monitoring Suppl (ACCU-CHEK GUIDE) w/Device KIT, 1 kit by Does not apply route 3 (three) times daily., Disp: 1 kit, Rfl: 3   chlorthalidone (HYGROTON) 25 MG tablet, TAKE 1 TABLET(25 MG) BY MOUTH DAILY, Disp: 90 tablet, Rfl: 2   fluticasone (FLONASE) 50 MCG/ACT nasal spray, Place 1 spray into both nostrils daily., Disp: 16 g, Rfl: 2   gabapentin (NEURONTIN)  100 MG capsule, Take 1 capsule (100 mg total) by mouth at bedtime., Disp: 90 capsule, Rfl: 2   glucose blood (ACCU-CHEK GUIDE) test strip, TEST BLOOD SUGAR THREE TIMES DAILY, Disp: 300 strip, Rfl: 2   Lancets (ACCU-CHEK MULTICLIX) lancets, Use to check blood sugars 3 times a day. Dx code: e11.65, Disp: 300 each, Rfl:  2   metFORMIN (GLUCOPHAGE) 1000 MG tablet, TAKE 1 TABLET TWICE DAILY WITH A MEAL, Disp: 180 tablet, Rfl: 2   Multiple Vitamin (MULTIVITAMIN) tablet, Take 1 tablet by mouth daily., Disp: , Rfl:    pantoprazole (PROTONIX) 40 MG tablet, TAKE 1 TABLET ONE TIME DAILY, Disp: 90 tablet, Rfl: 2   rosuvastatin (CRESTOR) 10 MG tablet, Take 1 tablet (10 mg total) by mouth daily., Disp: 90 tablet, Rfl: 3   tamsulosin (FLOMAX) 0.4 MG CAPS capsule, Take 1 capsule (0.4 mg total) by mouth 2 (two) times daily., Disp: 160 capsule, Rfl: 2   valsartan (DIOVAN) 160 MG tablet, TAKE 1 TABLET EVERY DAY, Disp: 90 tablet, Rfl: 2   Allergies  Allergen Reactions   Amlodipine Other (See Comments)    Headaches    Farxiga [Dapagliflozin] Other (See Comments)    headaches     Men's preventive visit. Patient Health Questionnaire (PHQ-2) is  Flowsheet Row Clinical Support from 08/09/2020 in [facility]  PHQ-2 Total Score 0     . Patient is on a diabetic healthy diet. Marital status: Married. Relevant history for alcohol use is:  Social History   Substance and Sexual Activity  Alcohol Use Not Currently  . Relevant history for tobacco use is: cigar once in a while.  Social History   Tobacco Use  Smoking Status Former   Packs/day: 1.00   Years: 25.00   Pack years: 25.00   Types: Cigarettes   Quit date: 1999   Years since quitting: 23.7  Smokeless Tobacco Former   Types: Chew   Quit date: 2018  .   Review of Systems  Constitutional: Negative.  Negative for chills and fever.       Some weight loss   HENT: Negative.  Negative for rhinorrhea, sinus pressure and sinus pain.   Eyes: Negative.  Negative for blurred vision.  Respiratory: Negative.  Negative for shortness of breath and wheezing.   Cardiovascular: Negative.  Negative for chest pain and palpitations.  Gastrointestinal: Negative.  Negative for constipation, diarrhea and nausea.  Endocrine: Negative.   Genitourinary: Negative.   Negative for dysuria and urgency.  Musculoskeletal: Negative.  Negative for arthralgias and myalgias.  Skin: Negative.   Allergic/Immunologic: Negative.   Neurological: Negative.  Negative for weakness, numbness and headaches.  Hematological: Negative.   Psychiatric/Behavioral: Negative.      Today's Vitals   10/12/20 1027  BP: 128/70  Pulse: 70  Temp: 97.6 F (36.4 C)  TempSrc: Oral  Weight: 202 lb (91.6 kg)  Height: 5' 11" (1.803 m)   Body mass index is 28.17 kg/m.  Wt Readings from Last 3 Encounters:  10/12/20 202 lb (91.6 kg)  08/09/20 200 lb 3.2 oz (90.8 kg)  06/07/20 202 lb 9.6 oz (91.9 kg)    Objective:  Physical Exam Vitals and nursing note reviewed.  Constitutional:      Appearance: Normal appearance.  HENT:     Head: Normocephalic and atraumatic.     Right Ear: Tympanic membrane, ear canal and external ear normal. There is no impacted cerumen.     Left Ear: Tympanic membrane, ear canal and external  ear normal. There is no impacted cerumen.     Nose: Nose normal. No congestion.     Mouth/Throat:     Mouth: Mucous membranes are moist.     Pharynx: Oropharynx is clear.  Eyes:     Extraocular Movements: Extraocular movements intact.     Conjunctiva/sclera: Conjunctivae normal.     Pupils: Pupils are equal, round, and reactive to light.  Cardiovascular:     Rate and Rhythm: Normal rate and regular rhythm.     Pulses: Normal pulses.     Heart sounds: Normal heart sounds. No murmur heard. Pulmonary:     Effort: Pulmonary effort is normal. No respiratory distress.     Breath sounds: Normal breath sounds. No wheezing.  Chest:  Breasts:    Right: Normal. No swelling, bleeding, inverted nipple, mass or nipple discharge.     Left: Normal. No swelling, bleeding, inverted nipple, mass or nipple discharge.  Abdominal:     General: Abdomen is flat. Bowel sounds are normal.     Palpations: Abdomen is soft.     Tenderness: There is no abdominal tenderness.   Genitourinary:    Rectum: Guaiac result negative.     Comments: Deferred. He sees a urologist  Musculoskeletal:        General: Normal range of motion.     Cervical back: Normal range of motion and neck supple.  Skin:    General: Skin is warm.     Capillary Refill: Capillary refill takes less than 2 seconds.  Neurological:     General: No focal deficit present.     Mental Status: He is alert and oriented to person, place, and time.  Psychiatric:        Mood and Affect: Mood normal.        Behavior: Behavior normal.        Assessment And Plan:    1. Routine general medical examination at a health care facility -Patient is here for their annual physical exam and we discussed any changes to medication and medical history.  -Behavior modification was discussed as well as diet and exercise history  -Patient will continue to exercise regularly and modify their diet.  -Recommendation for yearly physical annuals, immunization and screenings including mammogram and colonoscopy were discussed with the patient.  -Recommended intake of multivitamin, vitamin D and calcium.  -Individualized advise was given to the patient pertaining to their own health history in regards to diet, exercise, medical condition and referrals.   2. Type 2 diabetes mellitus with stage 2 chronic kidney disease, without long-term current use of insulin (HCC) -Chronic, stable, he is complaint with his meds  -His BS at home runs in the 100's.  --Discussed with patient the importance of glycemic control and long term complications from uncontrolled diabetes. Discussed with the patient the importance of compliance with home glucose monitoring, diet which includes decrease amount of sugary drinks and foods. Importance of exercise was also discussed with the patient. Importance of eye exams, self foot care and compliance to office visits was also discussed with the patient.  - CBC - Hemoglobin A1c - CMP14+EGFR  3.  Hypertensive nephropathy -Continue with meds.  -He is followed by cardiologist.  - CBC - Hemoglobin A1c - CMP14+EGFR  4. Vitamin D deficiency disease - VITAMIN D 25 Hydroxy (Vit-D Deficiency, Fractures)  5. Weight loss -Will check thyroid panel and will assess if needed.  - TSH + free T4  6. Overweight with body mass index (BMI) of 28  to 28.9 in adult Advised patient on a healthy diet including avoiding fast food and red meats. Increase the intake of lean meats including grilled chicken and [place].  Drink a lot of water. Decrease intake of fatty foods. Exercise for 30-45 min. 4-5 a week to decrease the risk of cardiac event.   The patient was encouraged to call or send a message through [facility] for any questions or concerns.   Follow up: if symptoms persist or do not get better.   Side effects and appropriate use of all the medication(s) were discussed with the patient today. Patient advised to use the medication(s) as directed by their healthcare provider. The patient was encouraged to read, review, and understand all associated package inserts and contact our office with any questions or concerns. The patient accepts the risks of the treatment plan and had an opportunity to ask questions.   Staying healthy and adopting a healthy lifestyle for your overall health is important. You should eat 7 or more servings of fruits and vegetables per day. You should drink plenty of water to keep yourself hydrated and your kidneys healthy. This includes about 65-80+ fluid ounces of water. Limit your intake of animal fats especially for elevated cholesterol. Avoid highly processed food and limit your salt intake if you have hypertension. Avoid foods high in saturated/Trans fats. Along with a healthy diet it is also very important to maintain time for yourself to maintain a healthy mental health with low stress levels. You should get atleast 150 min of moderate intensity exercise weekly for a healthy heart.  Along with eating right and exercising, aim for at least 7-9 hours of sleep daily.  Eat more whole grains which includes barley, wheat berries, oats, brown rice and whole wheat pasta. Use healthy plant oils which include olive, soy, corn, sunflower and peanut. Limit your caffeine and sugary drinks. Limit your intake of fast foods. Limit milk and dairy products to one or two daily servings.   Patient was given opportunity to ask questions. Patient verbalized understanding of the plan and was able to repeat key elements of the plan. All questions were answered to their satisfaction.  Raman Ghumman, DNP   I, Raman Ghumman have reviewed all documentation for this visit. The documentation on 10/12/20 for the exam, diagnosis, procedures, and orders are all accurate and complete.   am, diagnosis, procedures, and orders are all accurate and complete.  THE PATIENT IS ENCOURAGED TO PRACTICE SOCIAL DISTANCING DUE TO THE COVID-19 PANDEMIC.

## 2020-10-12 NOTE — Patient Instructions (Signed)
Health Maintenance, Male Adopting a healthy lifestyle and getting preventive care are important in promoting health and wellness. Ask your health care provider about: The right schedule for you to have regular tests and exams. Things you can do on your own to prevent diseases and keep yourself healthy. What should I know about diet, weight, and exercise? Eat a healthy diet  Eat a diet that includes plenty of vegetables, fruits, low-fat dairy products, and lean protein. Do not eat a lot of foods that are high in solid fats, added sugars, or sodium. Maintain a healthy weight Body mass index (BMI) is a measurement that can be used to identify possible weight problems. It estimates body fat based on height and weight. Your health care provider can help determine your BMI and help you achieve or maintain a healthy weight. Get regular exercise Get regular exercise. This is one of the most important things you can do for your health. Most adults should: Exercise for at least 150 minutes each week. The exercise should increase your heart rate and make you sweat (moderate-intensity exercise). Do strengthening exercises at least twice a week. This is in addition to the moderate-intensity exercise. Spend less time sitting. Even light physical activity can be beneficial. Watch cholesterol and blood lipids Have your blood tested for lipids and cholesterol at 69 years of age, then have this test every 5 years. You may need to have your cholesterol levels checked more often if: Your lipid or cholesterol levels are high. You are older than 69 years of age. You are at high risk for heart disease. What should I know about cancer screening? Many types of cancers can be detected early and may often be prevented. Depending on your health history and family history, you may need to have cancer screening at various ages. This may include screening for: Colorectal cancer. Prostate cancer. Skin cancer. Lung  cancer. What should I know about heart disease, diabetes, and high blood pressure? Blood pressure and heart disease High blood pressure causes heart disease and increases the risk of stroke. This is more likely to develop in people who have high blood pressure readings, are of African descent, or are overweight. Talk with your health care provider about your target blood pressure readings. Have your blood pressure checked: Every 3-5 years if you are 18-39 years of age. Every year if you are 40 years old or older. If you are between the ages of 65 and 75 and are a current or former smoker, ask your health care provider if you should have a one-time screening for abdominal aortic aneurysm (AAA). Diabetes Have regular diabetes screenings. This checks your fasting blood sugar level. Have the screening done: Once every three years after age 45 if you are at a normal weight and have a low risk for diabetes. More often and at a younger age if you are overweight or have a high risk for diabetes. What should I know about preventing infection? Hepatitis B If you have a higher risk for hepatitis B, you should be screened for this virus. Talk with your health care provider to find out if you are at risk for hepatitis B infection. Hepatitis C Blood testing is recommended for: Everyone born from 1945 through 1965. Anyone with known risk factors for hepatitis C. Sexually transmitted infections (STIs) You should be screened each year for STIs, including gonorrhea and chlamydia, if: You are sexually active and are younger than 69 years of age. You are older than 69 years   of age and your health care provider tells you that you are at risk for this type of infection. Your sexual activity has changed since you were last screened, and you are at increased risk for chlamydia or gonorrhea. Ask your health care provider if you are at risk. Ask your health care provider about whether you are at high risk for HIV.  Your health care provider may recommend a prescription medicine to help prevent HIV infection. If you choose to take medicine to prevent HIV, you should first get tested for HIV. You should then be tested every 3 months for as long as you are taking the medicine. Follow these instructions at home: Lifestyle Do not use any products that contain nicotine or tobacco, such as cigarettes, e-cigarettes, and chewing tobacco. If you need help quitting, ask your health care provider. Do not use street drugs. Do not share needles. Ask your health care provider for help if you need support or information about quitting drugs. Alcohol use Do not drink alcohol if your health care provider tells you not to drink. If you drink alcohol: Limit how much you have to 0-2 drinks a day. Be aware of how much alcohol is in your drink. In the [place] one drink equals one 12 oz bottle of beer (355 mL), one 5 oz glass of wine (148 mL), or one 1 oz glass of hard liquor (44 mL). General instructions Schedule regular health, dental, and eye exams. Stay current with your vaccines. Tell your health care provider if: You often feel depressed. You have ever been abused or do not feel safe at home. Summary Adopting a healthy lifestyle and getting preventive care are important in promoting health and wellness. Follow your health care provider's instructions about healthy diet, exercising, and getting tested or screened for diseases. Follow your health care provider's instructions on monitoring your cholesterol and blood pressure. This information is not intended to replace advice given to you by your health care provider. Make sure you discuss any questions you have with your health care provider. Document Revised: 03/03/2020 Document Reviewed: 12/17/2017 Elsevier Patient Education  2022 Elsevier Inc.  

## 2020-10-13 LAB — CMP14+EGFR
ALT: 16 IU/L (ref 0–44)
AST: 18 IU/L (ref 0–40)
Albumin/Globulin Ratio: 1.5 (ref 1.2–2.2)
Albumin: 4.5 g/dL (ref 3.8–4.8)
Alkaline Phosphatase: 81 IU/L (ref 44–121)
BUN/Creatinine Ratio: 16 (ref 10–24)
BUN: 18 mg/dL (ref 8–27)
Bilirubin Total: <0.2 mg/dL (ref 0.0–1.2)
CO2: 24 mmol/L (ref 20–29)
Calcium: 10.3 mg/dL — ABNORMAL HIGH (ref 8.6–10.2)
Chloride: 100 mmol/L (ref 96–106)
Creatinine, Ser: 1.14 mg/dL (ref 0.76–1.27)
Globulin, Total: 3.1 g/dL (ref 1.5–4.5)
Glucose: 150 mg/dL — ABNORMAL HIGH (ref 70–99)
Potassium: 3.9 mmol/L (ref 3.5–5.2)
Sodium: 139 mmol/L (ref 134–144)
Total Protein: 7.6 g/dL (ref 6.0–8.5)
eGFR: 70 mL/min/{1.73_m2} (ref >59)

## 2020-10-13 LAB — CBC
Hematocrit: 39.5 % (ref 37.5–51.0)
Hemoglobin: 13.7 g/dL (ref 13.0–17.7)
MCH: 30.1 pg (ref 26.6–33.0)
MCHC: 34.7 g/dL (ref 31.5–35.7)
MCV: 87 fL (ref 79–97)
Platelets: 170 10*3/uL (ref 150–450)
RBC: 4.55 x10E6/uL (ref 4.14–5.80)
RDW: 13.6 % (ref 11.6–15.4)
WBC: 3.6 10*3/uL (ref 3.4–10.8)

## 2020-10-13 LAB — TSH+FREE T4
Free T4: 1.11 ng/dL (ref 0.82–1.77)
TSH: 2.11 u[IU]/mL (ref 0.450–4.500)

## 2020-10-13 LAB — HEMOGLOBIN A1C
Est. average glucose Bld gHb Est-mCnc: 157 mg/dL
Hgb A1c MFr Bld: 7.1 % — ABNORMAL HIGH (ref 4.8–5.6)

## 2020-10-13 LAB — VITAMIN D 25 HYDROXY (VIT D DEFICIENCY, FRACTURES): Vit D, 25-Hydroxy: 39.8 ng/mL (ref 30.0–100.0)

## 2020-10-24 ENCOUNTER — Encounter: Payer: Medicare HMO | Admitting: Internal Medicine

## 2020-10-26 ENCOUNTER — Telehealth: Payer: Medicare HMO

## 2020-10-26 ENCOUNTER — Telehealth: Payer: Self-pay

## 2020-10-26 NOTE — Telephone Encounter (Signed)
  Care Management   Follow Up Note   10/26/2020 Name: Kelly Lambert MRN: [id number] DOB: August 25, 1950   Referred by: [name], MD Reason for referral : Chronic Care Management (RN CM Follow up call )   An unsuccessful telephone outreach was attempted today. The patient was referred to the case management team for assistance with care management and care coordination.   Follow Up Plan: A HIPPA compliant phone message was left for the patient providing contact information and requesting a return call.   Barb Merino, RN, BSN, CCM Care Management Coordinator [facility] [facility]  Direct Phone: [phone]

## 2020-11-04 ENCOUNTER — Other Ambulatory Visit: Payer: Self-pay | Admitting: Internal Medicine

## 2020-11-14 ENCOUNTER — Telehealth: Payer: Self-pay

## 2020-11-14 ENCOUNTER — Telehealth: Payer: Medicare HMO

## 2020-11-14 NOTE — Telephone Encounter (Signed)
  Care Management   Follow Up Note   11/14/2020 Name: Kelly Lambert MRN: [id number] DOB: [birth year]   Referred by: [name], MD Reason for referral : No chief complaint on file.   A second unsuccessful telephone outreach was attempted today. The patient was referred to the case management team for assistance with care management and care coordination.   Follow Up Plan: A HIPPA compliant phone message was left for the patient providing contact information and requesting a return call.   Barb Merino, RN, BSN, CCM Care Management Coordinator [place] [facility]  Direct Phone: [phone]

## 2020-11-21 ENCOUNTER — Telehealth: Payer: Self-pay

## 2020-12-04 ENCOUNTER — Other Ambulatory Visit: Payer: Medicare HMO

## 2020-12-11 ENCOUNTER — Ambulatory Visit (INDEPENDENT_AMBULATORY_CARE_PROVIDER_SITE_OTHER): Payer: Medicare HMO

## 2020-12-11 ENCOUNTER — Telehealth: Payer: Medicare HMO

## 2020-12-11 DIAGNOSIS — I129 Hypertensive chronic kidney disease with stage 1 through stage 4 chronic kidney disease, or unspecified chronic kidney disease: Secondary | ICD-10-CM

## 2020-12-11 DIAGNOSIS — E1122 Type 2 diabetes mellitus with diabetic chronic kidney disease: Secondary | ICD-10-CM

## 2020-12-11 DIAGNOSIS — I2510 Atherosclerotic heart disease of native coronary artery without angina pectoris: Secondary | ICD-10-CM

## 2020-12-11 DIAGNOSIS — E559 Vitamin D deficiency, unspecified: Secondary | ICD-10-CM

## 2020-12-11 NOTE — Patient Instructions (Signed)
Visit Information   Thank you for taking time to visit with me today. Please don't hesitate to contact me if I can be of assistance to you before our next scheduled telephone appointment.  Following are the goals we discussed today:  (Copy and paste patient goals from clinical care plan here)  Our next appointment is by telephone on 01/16/21 at 12:55 PM   Please call the care guide team at [phone] if you need to cancel or reschedule your appointment.   Following is a copy of your full care plan:    Care Plan : Kelly of Care  Updates made by Lynne Logan, RN since 12/11/2020 12:00 AM     Problem: No Plan of Care established for management of chronic disease states (DM, HTN, Vitamin D deficiency, HLD)   Priority: High     Long-Range Goal: Development of plan of care for chronic disease management DM, HTN, Vitamin D deficiency, HLD   Start Date: 12/11/2020  Expected End Date: 12/11/2021  This Visit's Progress: On track  Priority: High  Note:   Current Barriers:  Knowledge Deficits related to plan of care for management of DM, HTN, Vitamin D deficiency, HLD   Chronic Disease Management support and education needs related to DM, HTN, Vitamin D deficiency, HLD    RNCM Clinical Goal(s):  Patient will verbalize basic understanding of  DM, HTN, Vitamin D deficiency, HLD  disease process and self health management plan as evidenced by patient will report having no disease exacerbations related to his chronic disease states listed above take all medications exactly as prescribed and will call provider for medication related questions as evidenced by patient will report having no missed doses of his prescribed medications demonstrate Improved health management independence as evidenced by patient will adhere to all treatment recommendations exactly as prescribed continue to work with RN Care Manager to address care management and care coordination needs related to  DM, HTN,  Vitamin D deficiency, HLD  as evidenced by adherence to CM Team Scheduled appointments demonstrate ongoing self health care management ability   as evidenced by    through collaboration with RN Care manager, provider, and care team.   Interventions: 1:1 collaboration with primary care provider regarding development and update of comprehensive plan of care as evidenced by provider attestation and co-signature Inter-disciplinary care team collaboration (see longitudinal plan of care) Evaluation of current treatment plan related to  self management and patient's adherence to plan as established by provider   CAD Interventions: (Status:  Goal on track:  Yes.) Long Term Goal Assessed understanding of CAD diagnosis Medications reviewed including medications utilized in CAD treatment plan Provided education on importance of blood pressure control in management of CAD Provided education on Importance of limiting foods high in cholesterol Counseled on importance of regular laboratory monitoring as prescribed Counseled on the importance of exercise goals with target of 150 minutes per week Reviewed Importance of taking all medications as prescribed Advised to report any changes in symptoms or exercise tolerance Assessed social determinant of health barriers Discussed plans with patient for ongoing care management follow up and provided patient with direct contact information for care management team  Diabetes Interventions:  (Status:  Goal on track:  Yes.) Long Term Goal Assessed patient's understanding of A1c goal: <7% Provided education to patient about basic DM disease process Reviewed medications with patient and discussed importance of medication adherence Counseled on importance of regular laboratory monitoring as prescribed Advised patient, providing  education and rationale, to check cbg daily before meals and at bedtime and record, calling PCP and or RN CM for findings outside established  parameters Review of patient status, including review of consultants reports, relevant laboratory and other test results, and medications completed Discussed plans with patient for ongoing care management follow up and provided patient with direct contact information for care management team Lab Results  Component Value Date   HGBA1C 7.1 (H) 10/12/2020   Vitamin D deficiency Interventions:  (Status:  Goal on track:  Yes.)  Long Term Goal Evaluation of current treatment plan related to  Vitamin D deficiency , self-management and patient's adherence to plan as established by provider Review of patient status, including review of consultant's reports, relevant laboratory and other test results, and medications completed. Reviewed medications with patient and discussed importance of medication adherence Discussed plans with patient for ongoing care management follow up and provided patient with direct contact information for care management team Vit D, 25-Hydroxy 30.0 - 100.0 ng/mL 39.8  23.9 Low  CM    Hypertension Interventions:  (Status:  Goal on track:  Yes.) Long Term Goal Last practice recorded BP readings:  BP Readings from Last 3 Encounters:  10/12/20 128/70  08/09/20 108/68  06/07/20 112/70  Most recent eGFR/CrCl:  Lab Results  Component Value Date   EGFR 70 10/12/2020    No components found for: CRCL  Evaluation of current treatment plan related to hypertension self management and patient's adherence to plan as established by provider Provided education to patient re: stroke prevention, s/s of heart attack and stroke Reviewed medications with patient and discussed importance of compliance Counseled on the importance of exercise goals with target of 150 minutes per week Advised patient, providing education and rationale, to monitor blood pressure daily and record, calling PCP for findings outside established parameters Provided education on prescribed diet low Sodium Discussed  complications of poorly controlled blood pressure such as heart disease, stroke, circulatory complications, vision complications, kidney impairment, sexual dysfunction Discussed plans with patient for ongoing care management follow up and provided patient with direct contact information for care management team  Patient Goals/Self-Care Activities: Take all medications as prescribed Attend all scheduled provider appointments Call pharmacy for medication refills 3-7 days in advance of running out of medications Perform all self care activities independently  Perform IADL's (shopping, preparing meals, housekeeping, managing finances) independently Call provider office for new concerns or questions  drink 6 to 8 glasses of water each day fill half of plate with vegetables limit fast food meals to no more than 1 per week manage portion size take medications for blood pressure exactly as prescribed  Follow Up Plan:  Telephone follow up appointment with care management team member scheduled for:  01/16/21      Consent to CCM Services: Mr. Lambert was given information about Chronic Care Management services including:  CCM service includes personalized support from designated clinical staff supervised by his physician, including individualized plan of care and coordination with other care providers 24/7 contact phone numbers for assistance for urgent and routine care needs. Service will only be billed when office clinical staff spend 20 minutes or more in a month to coordinate care. Only one practitioner may furnish and bill the service in a calendar month. The patient may stop CCM services at any time (effective at the end of the month) by phone call to the office staff. The patient will be responsible for cost sharing (co-pay) of up to 20% of  the service fee (after annual deductible is met).  Patient agreed to services and verbal consent obtained.   Patient verbalizes understanding of  instructions provided today and agrees to view in [place].   Telephone follow up appointment with care management team member scheduled for: 01/16/21

## 2020-12-11 NOTE — Chronic Care Management (AMB) (Signed)
Chronic Care Management   CCM RN Visit Note  12/11/2020 Name: Kelly Lambert MRN: [id number] DOB: 1950-02-25  Subjective: Kelly Lambert is a 70 y.o. year old male who is a primary care patient of [name], MD. The care management team was consulted for assistance with disease management and care coordination needs.    Engaged with patient by telephone for follow up visit in response to provider referral for case management and/or care coordination services.   Consent to Services:  The patient was given information about Chronic Care Management services, agreed to services, and gave verbal consent prior to initiation of services.  Please see initial visit note for detailed documentation.   Patient agreed to services and verbal consent obtained.   Assessment: Review of patient past medical history, allergies, medications, health status, including review of consultants reports, laboratory and other test data, was performed as part of comprehensive evaluation and provision of chronic care management services.   SDOH (Social Determinants of Health) assessments and interventions performed:  Yes, no acute challenges  CCM Care Plan  Allergies  Allergen Reactions   Amlodipine Other (See Comments)    Headaches    Farxiga [Dapagliflozin] Other (See Comments)    headaches    Outpatient Encounter Medications as of 12/11/2020  Medication Sig   Ascorbic Acid (VITAMIN C PO) Take by mouth.   aspirin EC 81 MG tablet Take 81 mg by mouth daily.   Blood Glucose Calibration (ACCU-CHEK GUIDE CONTROL) LIQD Use as directed   Blood Glucose Monitoring Suppl (ACCU-CHEK GUIDE) w/Device KIT 1 kit by Does not apply route 3 (three) times daily.   chlorthalidone (HYGROTON) 25 MG tablet TAKE 1 TABLET(25 MG) BY MOUTH DAILY   fluticasone (FLONASE) 50 MCG/ACT nasal spray Place 1 spray into both nostrils daily.   gabapentin (NEURONTIN) 100 MG capsule Take 1 capsule (100 mg total) by mouth at bedtime.    glucose blood (ACCU-CHEK GUIDE) test strip TEST BLOOD SUGAR THREE TIMES DAILY   Lancets (ACCU-CHEK MULTICLIX) lancets Use to check blood sugars 3 times a day. Dx code: e11.65   metFORMIN (GLUCOPHAGE) 1000 MG tablet TAKE 1 TABLET TWICE DAILY WITH A MEAL   Multiple Vitamin (MULTIVITAMIN) tablet Take 1 tablet by mouth daily.   pantoprazole (PROTONIX) 40 MG tablet TAKE 1 TABLET ONE TIME DAILY   rosuvastatin (CRESTOR) 10 MG tablet Take 1 tablet (10 mg total) by mouth daily.   tamsulosin (FLOMAX) 0.4 MG CAPS capsule Take 1 capsule (0.4 mg total) by mouth 2 (two) times daily.   valsartan (DIOVAN) 160 MG tablet TAKE 1 TABLET EVERY DAY   No facility-administered encounter medications on file as of 12/11/2020.    Patient Active Problem List   Diagnosis Date Noted   PSVT (paroxysmal supraventricular tachycardia) ([place]) 05/25/2020   Coronary artery disease involving native coronary artery of native heart without angina pectoris 05/25/2020   Class 1 obesity due to excess calories without serious comorbidity with body mass index (BMI) of 30.0 to 30.9 in adult 04/24/2020   DOE (dyspnea on exertion) 02/04/2020   Diabetes mellitus with coincident hypertension ([place]) 02/04/2020   Mixed hyperlipidemia 02/04/2020   Palpitations 02/04/2020   Personal history of colonic polyps 03/01/2019   Type 2 diabetes mellitus with stage 2 chronic kidney disease, without long-term current use of insulin ([place]) 03/01/2019   Diabetic mononeuropathy associated with type 2 diabetes mellitus ([place]) 11/24/2017   Hypertensive nephropathy 11/24/2017   Chronic renal disease, stage II 11/24/2017    Conditions to be addressed/monitored:  DM, HTN, Vitamin D deficiency, HLD   Care Plan : RN Care Manager Plan of Care  Updates made by Lynne Logan, RN since 12/11/2020 12:00 AM     Problem: No Plan of Care established for management of chronic disease states (DM, HTN, Vitamin D deficiency, HLD)   Priority: High     Long-Range Goal:  Development of plan of care for chronic disease management DM, HTN, Vitamin D deficiency, HLD   Start Date: 12/11/2020  Expected End Date: 12/11/2021  This Visit's Progress: On track  Priority: High  Note:   Current Barriers:  Knowledge Deficits related to plan of care for management of DM, HTN, Vitamin D deficiency, HLD   Chronic Disease Management support and education needs related to DM, HTN, Vitamin D deficiency, HLD    RNCM Clinical Goal(s):  Patient will verbalize basic understanding of  DM, HTN, Vitamin D deficiency, HLD  disease process and self health management plan as evidenced by patient will report having no disease exacerbations related to his chronic disease states listed above take all medications exactly as prescribed and will call provider for medication related questions as evidenced by patient will report having no missed doses of his prescribed medications demonstrate Improved health management independence as evidenced by patient will adhere to all treatment recommendations exactly as prescribed continue to work with RN Care Manager to address care management and care coordination needs related to  DM, HTN, Vitamin D deficiency, HLD  as evidenced by adherence to CM Team Scheduled appointments demonstrate ongoing self health care management ability   as evidenced by    through collaboration with RN Care manager, provider, and care team.   Interventions: 1:1 collaboration with primary care provider regarding development and update of comprehensive plan of care as evidenced by provider attestation and co-signature Inter-disciplinary care team collaboration (see longitudinal plan of care) Evaluation of current treatment plan related to  self management and patient's adherence to plan as established by provider   CAD Interventions: (Status:  Goal on track:  Yes.) Long Term Goal Assessed understanding of CAD diagnosis Medications reviewed including medications utilized in CAD  treatment plan Provided education on importance of blood pressure control in management of CAD Provided education on Importance of limiting foods high in cholesterol Counseled on importance of regular laboratory monitoring as prescribed Counseled on the importance of exercise goals with target of 150 minutes per week Reviewed Importance of taking all medications as prescribed Advised to report any changes in symptoms or exercise tolerance Assessed social determinant of health barriers Discussed plans with patient for ongoing care management follow up and provided patient with direct contact information for care management team  Diabetes Interventions:  (Status:  Goal on track:  Yes.) Long Term Goal Assessed patient's understanding of A1c goal: <7% Provided education to patient about basic DM disease process Reviewed medications with patient and discussed importance of medication adherence Counseled on importance of regular laboratory monitoring as prescribed Advised patient, providing education and rationale, to check cbg daily before meals and at bedtime and record, calling PCP and or RN CM for findings outside established parameters Review of patient status, including review of consultants reports, relevant laboratory and other test results, and medications completed Discussed plans with patient for ongoing care management follow up and provided patient with direct contact information for care management team Lab Results  Component Value Date   HGBA1C 7.1 (H) 10/12/2020   Vitamin D deficiency Interventions:  (Status:  Goal on track:  Yes.)  Long Term Goal Evaluation of current treatment plan related to  Vitamin D deficiency , self-management and patient's adherence to plan as established by provider Review of patient status, including review of consultant's reports, relevant laboratory and other test results, and medications completed. Reviewed medications with patient and discussed  importance of medication adherence Discussed plans with patient for ongoing care management follow up and provided patient with direct contact information for care management team Vit D, 25-Hydroxy 30.0 - 100.0 ng/mL 39.8  23.9 Low  CM    Hypertension Interventions:  (Status:  Goal on track:  Yes.) Long Term Goal Last practice recorded BP readings:  BP Readings from Last 3 Encounters:  10/12/20 128/70  08/09/20 108/68  06/07/20 112/70  Most recent eGFR/CrCl:  Lab Results  Component Value Date   EGFR 70 10/12/2020    No components found for: CRCL  Evaluation of current treatment plan related to hypertension self management and patient's adherence to plan as established by provider Provided education to patient re: stroke prevention, s/s of heart attack and stroke Reviewed medications with patient and discussed importance of compliance Counseled on the importance of exercise goals with target of 150 minutes per week Advised patient, providing education and rationale, to monitor blood pressure daily and record, calling PCP for findings outside established parameters Provided education on prescribed diet low Sodium Discussed complications of poorly controlled blood pressure such as heart disease, stroke, circulatory complications, vision complications, kidney impairment, sexual dysfunction Discussed plans with patient for ongoing care management follow up and provided patient with direct contact information for care management team  Patient Goals/Self-Care Activities: Take all medications as prescribed Attend all scheduled provider appointments Call pharmacy for medication refills 3-7 days in advance of running out of medications Perform all self care activities independently  Perform IADL's (shopping, preparing meals, housekeeping, managing finances) independently Call provider office for new concerns or questions  drink 6 to 8 glasses of water each day fill half of plate with  vegetables limit fast food meals to no more than 1 per week manage portion size take medications for blood pressure exactly as prescribed  Follow Up Plan:  Telephone follow up appointment with care management team member scheduled for:  01/16/21      Plan:Telephone follow up appointment with care management team member scheduled for:  01/16/21  Barb Merino, RN, BSN, CCM Care Management Coordinator [place] [facility]  Direct Phone: [phone]

## 2020-12-18 ENCOUNTER — Other Ambulatory Visit: Payer: Self-pay | Admitting: Internal Medicine

## 2020-12-21 ENCOUNTER — Other Ambulatory Visit: Payer: Self-pay

## 2021-01-06 DIAGNOSIS — I129 Hypertensive chronic kidney disease with stage 1 through stage 4 chronic kidney disease, or unspecified chronic kidney disease: Secondary | ICD-10-CM

## 2021-01-06 DIAGNOSIS — I2510 Atherosclerotic heart disease of native coronary artery without angina pectoris: Secondary | ICD-10-CM

## 2021-01-06 DIAGNOSIS — N182 Chronic kidney disease, stage 2 (mild): Secondary | ICD-10-CM | POA: Diagnosis not present

## 2021-01-06 DIAGNOSIS — E1122 Type 2 diabetes mellitus with diabetic chronic kidney disease: Secondary | ICD-10-CM | POA: Diagnosis not present

## 2021-01-11 ENCOUNTER — Ambulatory Visit: Payer: Medicare HMO | Admitting: Internal Medicine

## 2021-01-16 ENCOUNTER — Telehealth: Payer: Medicare HMO

## 2021-01-23 ENCOUNTER — Telehealth: Payer: Self-pay

## 2021-01-23 NOTE — Chronic Care Management (AMB) (Signed)
Chronic Care Management Pharmacy Assistant   Name: Kelly Lambert  MRN: [id number] DOB: 06-12-1950  Reason for Encounter: Disease State/ Hypertension  Recent office visits:  12-11-2020 LittleClaudette Stapler, RN (CCM)  10-12-2020 Bary Castilla, NP. STOP Vitamin D. Glucose= 150, Calcium= 10.3. A1c= 7.1.  08-29-2020 Little, Claudette Stapler, RN (CCM)  Recent consult visits:  09-25-2020 [facility]. Drug screening.  09-01-2020 Joya San, MD (Optometry). Unable to view encounter  08-31-2020 Festus Aloe, MD (Urology). Unable to view encounter,  Hospital visits:  None in previous 6 months  Medications: Outpatient Encounter Medications as of 01/23/2021  Medication Sig   Ascorbic Acid (VITAMIN C PO) Take by mouth.   aspirin EC 81 MG tablet Take 81 mg by mouth daily.   Blood Glucose Calibration (ACCU-CHEK GUIDE CONTROL) LIQD Use as directed   Blood Glucose Monitoring Suppl (ACCU-CHEK GUIDE) w/Device KIT 1 kit by Does not apply route 3 (three) times daily.   chlorthalidone (HYGROTON) 25 MG tablet TAKE 1 TABLET(25 MG) BY MOUTH DAILY   fluticasone (FLONASE) 50 MCG/ACT nasal spray Place 1 spray into both nostrils daily.   gabapentin (NEURONTIN) 100 MG capsule Take 1 capsule (100 mg total) by mouth at bedtime.   glucose blood (ACCU-CHEK GUIDE) test strip TEST BLOOD SUGAR THREE TIMES DAILY   Lancets (ACCU-CHEK MULTICLIX) lancets Use to check blood sugars 3 times a day. Dx code: e11.65   metFORMIN (GLUCOPHAGE) 1000 MG tablet TAKE 1 TABLET TWICE DAILY WITH A MEAL   Multiple Vitamin (MULTIVITAMIN) tablet Take 1 tablet by mouth daily.   pantoprazole (PROTONIX) 40 MG tablet TAKE 1 TABLET ONE TIME DAILY   rosuvastatin (CRESTOR) 10 MG tablet Take 1 tablet (10 mg total) by mouth daily.   tamsulosin (FLOMAX) 0.4 MG CAPS capsule Take 1 capsule (0.4 mg total) by mouth 2 (two) times daily.   valsartan (DIOVAN) 160 MG tablet TAKE 1 TABLET EVERY DAY   Vitamin D, Ergocalciferol, (DRISDOL) 1.25 MG  (50000 UNIT) CAPS capsule TAKE 1 CAPSULE TWICE WEEKLY ON TUESDAY AND FRIDAY   No facility-administered encounter medications on file as of 01/23/2021.   Reviewed chart prior to disease state call.   Recent Office Vitals: BP Readings from Last 3 Encounters:  10/12/20 128/70  08/09/20 108/68  06/07/20 112/70   Pulse Readings from Last 3 Encounters:  10/12/20 70  08/09/20 93  06/07/20 90    Wt Readings from Last 3 Encounters:  10/12/20 202 lb (91.6 kg)  08/09/20 200 lb 3.2 oz (90.8 kg)  06/07/20 202 lb 9.6 oz (91.9 kg)     Kidney Function Lab Results  Component Value Date/Time   CREATININE 1.14 10/12/2020 10:58 AM   CREATININE 1.21 05/24/2020 10:05 AM   GFRNONAA 64 02/14/2020 09:26 AM   GFRAA 74 02/14/2020 09:26 AM    BMP Latest Ref Rng & Units 10/12/2020 05/24/2020 02/14/2020  Glucose 70 - 99 mg/dL 150(H) 107(H) 116(H)  BUN 8 - 27 mg/dL 18 18 16   Creatinine 0.76 - 1.27 mg/dL 1.14 1.21 1.16  BUN/Creat Ratio 10 - 24 16 15 14   Sodium 134 - 144 mmol/L 139 139 137  Potassium 3.5 - 5.2 mmol/L 3.9 4.2 4.0  Chloride 96 - 106 mmol/L 100 100 99  CO2 20 - 29 mmol/L 24 23 24   Calcium 8.6 - 10.2 mg/dL 10.3(H) 10.1 10.2    01-23-2021: 1st attempt VM Full 01-25-2021: 2nd attempt VM full 01-26-2021: 3rd attempt VM Full  Care Gaps: PNA Vac overdue Shingrix overdue AWV 09-06-2021  Star Rating Drugs: Metformin 1000 mg- Last filled 11-08-2020 90 DS [facility] Valsartan 160 mg- Last filled 11-08-2020 90 DS [facility] Rosuvastatin 10 mg- Last filled 11-11-2020 90 DS [place] Clinical Pharmacist Assistant [phone]

## 2021-02-01 ENCOUNTER — Other Ambulatory Visit: Payer: Self-pay

## 2021-02-01 ENCOUNTER — Ambulatory Visit (INDEPENDENT_AMBULATORY_CARE_PROVIDER_SITE_OTHER): Payer: Medicare HMO | Admitting: Internal Medicine

## 2021-02-01 ENCOUNTER — Encounter: Payer: Self-pay | Admitting: Internal Medicine

## 2021-02-01 VITALS — BP 110/80 | HR 87 | Temp 97.9°F | Ht 67.6 in | Wt 190.4 lb

## 2021-02-01 DIAGNOSIS — Z2821 Immunization not carried out because of patient refusal: Secondary | ICD-10-CM

## 2021-02-01 DIAGNOSIS — E1122 Type 2 diabetes mellitus with diabetic chronic kidney disease: Secondary | ICD-10-CM | POA: Diagnosis not present

## 2021-02-01 DIAGNOSIS — I129 Hypertensive chronic kidney disease with stage 1 through stage 4 chronic kidney disease, or unspecified chronic kidney disease: Secondary | ICD-10-CM

## 2021-02-01 DIAGNOSIS — Z6829 Body mass index (BMI) 29.0-29.9, adult: Secondary | ICD-10-CM

## 2021-02-01 DIAGNOSIS — Z23 Encounter for immunization: Secondary | ICD-10-CM | POA: Diagnosis not present

## 2021-02-01 DIAGNOSIS — I471 Supraventricular tachycardia: Secondary | ICD-10-CM

## 2021-02-01 DIAGNOSIS — N182 Chronic kidney disease, stage 2 (mild): Secondary | ICD-10-CM | POA: Diagnosis not present

## 2021-02-01 MED ORDER — TETANUS-DIPHTH-ACELL PERTUSSIS 5-2.5-18.5 LF-MCG/0.5 IM SUSP: 0.5000 mL | Freq: Once | INTRAMUSCULAR | 0 refills | Status: AC

## 2021-02-01 NOTE — Patient Instructions (Signed)

## 2021-02-01 NOTE — Progress Notes (Signed)
Rich Brave Llittleton,acting as a Education administrator for Maximino Greenland, MD.,have documented all relevant documentation on the behalf of Maximino Greenland, MD,as directed by  Maximino Greenland, MD while in the presence of Maximino Greenland, MD.  This visit occurred during the SARS-CoV-2 public health emergency.  Safety protocols were in place, including screening questions prior to the visit, additional usage of staff PPE, and extensive cleaning of exam room while observing appropriate contact time as indicated for disinfecting solutions.  Subjective:     Patient ID: Kelly Lambert , male    DOB: [birth year] , 71 y.o.   MRN: [id number]   Chief Complaint  Patient presents with   Diabetes   Hypertension    HPI  The patient is here today for a diabetes/HTN f/u. He denies headaches, chest pain and shortness of breath. Patient reports he is concerned about his blood sugars.  On two occasions, his sugars have dropped in the mornings. The lowest it has been is 82. He reports he had to eat a piece of candy to bring it back up.  He states he has improved his eating habits. He adds that he has a new job and admits he doesn't always take a break to eat.   Diabetes He presents for his follow-up diabetic visit. He has type 2 diabetes mellitus. There are no hypoglycemic associated symptoms. Pertinent negatives for diabetes include no blurred vision and no chest pain. There are no hypoglycemic complications. Diabetic complications include nephropathy. Risk factors for coronary artery disease include diabetes mellitus, dyslipidemia, hypertension and male sex. He is compliant with treatment most of the time. He is following a diabetic diet. Meal planning includes avoidance of concentrated sweets. He participates in exercise intermittently. His home blood glucose trend is fluctuating minimally. His breakfast blood glucose is taken between 9-10 am. His breakfast blood glucose range is generally 110-130 mg/dl. An ACE  inhibitor/angiotensin II receptor blocker is being taken. Eye exam is current.  Hypertension This is a chronic problem. The current episode started more than 1 year ago. The problem has been gradually improving since onset. The problem is controlled. Pertinent negatives include no blurred vision, chest pain, palpitations or shortness of breath. Risk factors for coronary artery disease include diabetes mellitus, dyslipidemia and male gender. Past treatments include angiotensin blockers and diuretics. The current treatment provides moderate improvement. Compliance problems include exercise.  Hypertensive end-organ damage includes kidney disease.    Past Medical History:  Diagnosis Date   BPH (benign prostatic hyperplasia)    Diabetes mellitus without complication ([place])    Hyperlipidemia associated with type 2 diabetes mellitus ([place])    Hypertension      Family History  Problem Relation Age of Onset   Thyroid cancer Mother    Cancer Father    Cancer Brother      Current Outpatient Medications:    aspirin EC 81 MG tablet, Take 81 mg by mouth daily., Disp: , Rfl:    Blood Glucose Calibration (ACCU-CHEK GUIDE CONTROL) LIQD, Use as directed, Disp: 1 each, Rfl: 1   Blood Glucose Monitoring Suppl (ACCU-CHEK GUIDE) w/Device KIT, 1 kit by Does not apply route 3 (three) times daily., Disp: 1 kit, Rfl: 3   chlorthalidone (HYGROTON) 25 MG tablet, TAKE 1 TABLET(25 MG) BY MOUTH DAILY, Disp: 90 tablet, Rfl: 2   fluticasone (FLONASE) 50 MCG/ACT nasal spray, Place 1 spray into both nostrils daily., Disp: 16 g, Rfl: 2   gabapentin (NEURONTIN) 100 MG capsule, Take 1 capsule (  100 mg total) by mouth at bedtime., Disp: 90 capsule, Rfl: 2   glucose blood (ACCU-CHEK GUIDE) test strip, TEST BLOOD SUGAR THREE TIMES DAILY, Disp: 300 strip, Rfl: 2   Lancets (ACCU-CHEK MULTICLIX) lancets, Use to check blood sugars 3 times a day. Dx code: e11.65, Disp: 300 each, Rfl: 2   metFORMIN (GLUCOPHAGE) 1000 MG tablet, TAKE 1  TABLET TWICE DAILY WITH A MEAL, Disp: 180 tablet, Rfl: 2   Multiple Vitamin (MULTIVITAMIN) tablet, Take 1 tablet by mouth daily., Disp: , Rfl:    pantoprazole (PROTONIX) 40 MG tablet, TAKE 1 TABLET ONE TIME DAILY, Disp: 90 tablet, Rfl: 2   rosuvastatin (CRESTOR) 10 MG tablet, Take 1 tablet (10 mg total) by mouth daily., Disp: 90 tablet, Rfl: 3   tamsulosin (FLOMAX) 0.4 MG CAPS capsule, Take 1 capsule (0.4 mg total) by mouth 2 (two) times daily., Disp: 160 capsule, Rfl: 2   Tdap (BOOSTRIX) 5-2.5-18.5 LF-MCG/0.5 injection, Inject 0.5 mLs into the muscle once for 1 dose., Disp: 0.5 mL, Rfl: 0   valsartan (DIOVAN) 160 MG tablet, TAKE 1 TABLET EVERY DAY, Disp: 90 tablet, Rfl: 2   Vitamin D, Ergocalciferol, (DRISDOL) 1.25 MG (50000 UNIT) CAPS capsule, TAKE 1 CAPSULE TWICE WEEKLY ON TUESDAY AND FRIDAY, Disp: 24 capsule, Rfl: 0   Allergies  Allergen Reactions   Amlodipine Other (See Comments)    Headaches    Farxiga [Dapagliflozin] Other (See Comments)    headaches     Review of Systems  Constitutional: Negative.   Eyes:  Negative for blurred vision.  Respiratory: Negative.  Negative for shortness of breath.   Cardiovascular: Negative.  Negative for chest pain and palpitations.  Gastrointestinal: Negative.   Neurological: Negative.   Psychiatric/Behavioral: Negative.      Today's Vitals   02/01/21 1552  BP: 110/80  Pulse: 87  Temp: 97.9 F (36.6 C)  Weight: 190 lb 6.4 oz (86.4 kg)  Height: 5' 7.6" (1.717 m)  PainSc: 0-No pain   Body mass index is 29.29 kg/m.  Wt Readings from Last 3 Encounters:  02/01/21 190 lb 6.4 oz (86.4 kg)  10/12/20 202 lb (91.6 kg)  08/09/20 200 lb 3.2 oz (90.8 kg)     Objective:  Physical Exam Vitals and nursing note reviewed.  Constitutional:      Appearance: Normal appearance.  HENT:     Head: Normocephalic and atraumatic.     Nose:     Comments: Masked     Mouth/Throat:     Comments: Masked  Eyes:     Extraocular Movements: Extraocular  movements intact.  Cardiovascular:     Rate and Rhythm: Normal rate and regular rhythm.     Heart sounds: Normal heart sounds.  Pulmonary:     Effort: Pulmonary effort is normal.     Breath sounds: Normal breath sounds.  Musculoskeletal:     Cervical back: Normal range of motion.  Skin:    General: Skin is warm.  Neurological:     General: No focal deficit present.     Mental Status: He is alert.  Psychiatric:        Mood and Affect: Mood normal.        Assessment And Plan:     1. Type 2 diabetes mellitus with stage 2 chronic kidney disease, without long-term current use of insulin ([facility]) Comments: Chronic, I will check labs as below. Encouraged to eat regular, small meals throughout the day. I will adjust meds as needed.  - BMP8+EGFR - Hemoglobin  A1c  2. Hypertensive nephropathy Comments: Chronic, well controlled. NO med changes. Advised to follow low sodium diet.  3. PSVT (paroxysmal supraventricular tachycardia) ([place]) Comments: He is encouraged to avoid caffeine. Not currently on Bblocker. I appreciate Cardiology input.   4. BMI 29.0-29.9,adult Comments: HIs BMI is acceptable for his demographic. He is encouraged to aim for at least 150 minutes of exercise per week.   5. Immunization due Comments: I will send rx Tdap(Boostrix) to his local pharmacy.   6. Pneumococcal vaccination declined   Patient was given opportunity to ask questions. Patient verbalized understanding of the plan and was able to repeat key elements of the plan. All questions were answered to their satisfaction.   I, Maximino Greenland, MD, have reviewed all documentation for this visit. The documentation on 02/01/21 for the exam, diagnosis, procedures, and orders are all accurate and complete.   IF YOU HAVE BEEN REFERRED TO A SPECIALIST, IT MAY TAKE 1-2 WEEKS TO SCHEDULE/PROCESS THE REFERRAL. IF YOU HAVE NOT HEARD FROM US/SPECIALIST IN TWO WEEKS, PLEASE GIVE [place] A CALL AT [phone].   THE PATIENT  IS ENCOURAGED TO PRACTICE SOCIAL DISTANCING DUE TO THE COVID-19 PANDEMIC.

## 2021-02-02 LAB — BMP8+EGFR
BUN/Creatinine Ratio: 12 (ref 10–24)
BUN: 14 mg/dL (ref 8–27)
CO2: 27 mmol/L (ref 20–29)
Calcium: 10.3 mg/dL — ABNORMAL HIGH (ref 8.6–10.2)
Chloride: 100 mmol/L (ref 96–106)
Creatinine, Ser: 1.18 mg/dL (ref 0.76–1.27)
Glucose: 130 mg/dL — ABNORMAL HIGH (ref 70–99)
Potassium: 4.3 mmol/L (ref 3.5–5.2)
Sodium: 139 mmol/L (ref 134–144)
eGFR: 67 mL/min/{1.73_m2} (ref >59)

## 2021-02-02 LAB — HEMOGLOBIN A1C
Est. average glucose Bld gHb Est-mCnc: 157 mg/dL
Hgb A1c MFr Bld: 7.1 % — ABNORMAL HIGH (ref 4.8–5.6)

## 2021-02-05 ENCOUNTER — Other Ambulatory Visit: Payer: Self-pay | Admitting: Internal Medicine

## 2021-02-05 ENCOUNTER — Telehealth: Payer: Self-pay

## 2021-02-05 ENCOUNTER — Telehealth: Payer: Medicare HMO

## 2021-02-05 NOTE — Telephone Encounter (Signed)
°  Care Management   Follow Up Note   02/05/2021 Name: Kelly Lambert MRN: [id number] DOB: [birth year]   Referred by: [name], MD Reason for referral : Chronic Care Management (RN CM Follow up call )   An unsuccessful telephone outreach was attempted today. The patient was referred to the case management team for assistance with care management and care coordination.   Follow Up Plan: Telephone follow up appointment with care management team member scheduled for: 02/19/21  Barb Merino, RN, BSN, CCM Care Management Coordinator [place] [facility]  Direct Phone: [phone]

## 2021-02-19 ENCOUNTER — Telehealth: Payer: Self-pay

## 2021-02-19 ENCOUNTER — Telehealth: Payer: Medicare HMO

## 2021-02-19 NOTE — Telephone Encounter (Signed)
°  Care Management   Follow Up Note   02/19/2021 Name: Dontavian Marchi MRN: [id number] DOB: [birth year]   Referred by: [name], MD Reason for referral : Chronic Care Management (RN CM Follow up call - #2 attempt )   A second unsuccessful telephone outreach was attempted today. The patient was referred to the case management team for assistance with care management and care coordination.   Follow Up Plan: Telephone follow up appointment with care management team member scheduled for: 03/06/21   Barb Merino, RN, BSN, CCM Care Management Coordinator [place] [facility]  Direct Phone: [phone]

## 2021-03-06 ENCOUNTER — Ambulatory Visit: Payer: Self-pay

## 2021-03-06 ENCOUNTER — Telehealth: Payer: Medicare HMO

## 2021-03-06 DIAGNOSIS — E7800 Pure hypercholesterolemia, unspecified: Secondary | ICD-10-CM

## 2021-03-06 DIAGNOSIS — I129 Hypertensive chronic kidney disease with stage 1 through stage 4 chronic kidney disease, or unspecified chronic kidney disease: Secondary | ICD-10-CM

## 2021-03-06 DIAGNOSIS — E1122 Type 2 diabetes mellitus with diabetic chronic kidney disease: Secondary | ICD-10-CM

## 2021-03-06 DIAGNOSIS — E559 Vitamin D deficiency, unspecified: Secondary | ICD-10-CM

## 2021-03-06 DIAGNOSIS — N182 Chronic kidney disease, stage 2 (mild): Secondary | ICD-10-CM

## 2021-03-06 NOTE — Chronic Care Management (AMB) (Signed)
°  Care Management   Follow Up Note   03/06/2021 Name: Kelly Lambert MRN: [id number] DOB: 1950-07-26   Referred by: [name], MD Reason for referral : Chronic Care Management (RN CM follow up - #3 attempt)   Third unsuccessful telephone outreach was attempted today. The patient was referred to the case management team for assistance with care management and care coordination. The patient's primary care provider has been notified of our unsuccessful attempts to make or maintain contact with the patient. The care management team is pleased to engage with this patient at any time in the future should he/she be interested in assistance from the care management team.   Follow Up Plan: We have been unable to make contact with the patient for follow up. The care management team is available to follow up with the patient after provider conversation with the patient regarding recommendation for care management engagement and subsequent re-referral to the care management team.   Barb Merino, RN, BSN, CCM Care Management Coordinator [place] [facility]  Direct Phone: [phone]

## 2021-03-28 ENCOUNTER — Other Ambulatory Visit: Payer: Self-pay | Admitting: Internal Medicine

## 2021-04-21 ENCOUNTER — Other Ambulatory Visit: Payer: Self-pay | Admitting: Internal Medicine

## 2021-04-24 ENCOUNTER — Telehealth: Payer: Self-pay

## 2021-04-24 NOTE — Chronic Care Management (AMB) (Signed)
? ? ?Chronic Care Management ?Pharmacy Assistant  ? ?Name: Kelly Lambert  MRN: [id number] DOB: 1950-01-21 ? ?Reason for Encounter: Disease State/ Diabetes, Hypertension ? ?Recent office visits:  ?02-01-2021 [name], MD. Glucose= 130, Calcium= 10.3. A1C= 7.1. STOP vitamin C. Tdap ordered. ? ?12-11-2020 Little, Claudette Stapler, RN (CCM) ? ?Recent consult visits:  ?None ? ?Hospital visits:  ?None in previous 6 months ? ?Medications: ?Outpatient Encounter Medications as of 04/24/2021  ?Medication Sig  ? aspirin EC 81 MG tablet Take 81 mg by mouth daily.  ? Blood Glucose Calibration (ACCU-CHEK GUIDE CONTROL) LIQD USE AS DIRECTED  ? Blood Glucose Monitoring Suppl (ACCU-CHEK GUIDE) w/Device KIT 1 kit by Does not apply route 3 (three) times daily.  ? chlorthalidone (HYGROTON) 25 MG tablet TAKE 1 TABLET EVERY DAY  ? fluticasone (FLONASE) 50 MCG/ACT nasal spray Place 1 spray into both nostrils daily.  ? gabapentin (NEURONTIN) 100 MG capsule TAKE 1 CAPSULE EVERY DAY AT BEDTIME  ? glucose blood (ACCU-CHEK GUIDE) test strip TEST BLOOD SUGAR THREE TIMES DAILY  ? Lancets (ACCU-CHEK MULTICLIX) lancets Use to check blood sugars 3 times a day. Dx code: e11.65  ? metFORMIN (GLUCOPHAGE) 1000 MG tablet TAKE 1 TABLET TWICE DAILY WITH A MEAL  ? Multiple Vitamin (MULTIVITAMIN) tablet Take 1 tablet by mouth daily.  ? pantoprazole (PROTONIX) 40 MG tablet TAKE 1 TABLET ONE TIME DAILY  ? rosuvastatin (CRESTOR) 10 MG tablet Take 1 tablet (10 mg total) by mouth daily.  ? tamsulosin (FLOMAX) 0.4 MG CAPS capsule Take 1 capsule (0.4 mg total) by mouth 2 (two) times daily.  ? valsartan (DIOVAN) 160 MG tablet TAKE 1 TABLET EVERY DAY  ? Vitamin D, Ergocalciferol, (DRISDOL) 1.25 MG (50000 UNIT) CAPS capsule TAKE 1 CAPSULE TWICE WEEKLY ON TUESDAY AND FRIDAY  ? ?No facility-administered encounter medications on file as of 04/24/2021.  ?Recent Relevant Labs: ?Lab Results  ?Component Value Date/Time  ? HGBA1C 7.1 (H) 02/01/2021 04:47 PM  ? HGBA1C 7.1 (H)  10/12/2020 10:58 AM  ? MICROALBUR 10 08/09/2020 11:04 AM  ? MICROALBUR 30 07/28/2019 02:50 PM  ?  ?Kidney Function ?Lab Results  ?Component Value Date/Time  ? CREATININE 1.18 02/01/2021 04:47 PM  ? CREATININE 1.14 10/12/2020 10:58 AM  ? GFRNONAA 64 02/14/2020 09:26 AM  ? GFRAA 74 02/14/2020 09:26 AM  ? ? ?Reviewed chart prior to disease state call.  ? ?Recent Office Vitals: ?BP Readings from Last 3 Encounters:  ?02/01/21 110/80  ?10/12/20 128/70  ?08/09/20 108/68  ? ?Pulse Readings from Last 3 Encounters:  ?02/01/21 87  ?10/12/20 70  ?08/09/20 93  ?  ?Wt Readings from Last 3 Encounters:  ?02/01/21 190 lb 6.4 oz (86.4 kg)  ?10/12/20 202 lb (91.6 kg)  ?08/09/20 200 lb 3.2 oz (90.8 kg)  ?  ? ?Kidney Function ?Lab Results  ?Component Value Date/Time  ? CREATININE 1.18 02/01/2021 04:47 PM  ? CREATININE 1.14 10/12/2020 10:58 AM  ? GFRNONAA 64 02/14/2020 09:26 AM  ? GFRAA 74 02/14/2020 09:26 AM  ? ? ? ?  Latest Ref Rng & Units 02/01/2021  ?  4:47 PM 10/12/2020  ? 10:58 AM 05/24/2020  ? 10:05 AM  ?BMP  ?Glucose 70 - 99 mg/dL 130   150   107    ?BUN 8 - 27 mg/dL _0 ?Creatinine 0.76 - 1.27 mg/dL 1.18   1.14   1.21    ?BUN/Creat Ratio 10 - 24 12   16  15    ?Sodium 134 - 144 mmol/L 139   139   139    ?Potassium 3.5 - 5.2 mmol/L 4.3   3.9   4.2    ?Chloride 96 - 106 mmol/L 100   100   100    ?CO2 20 - 29 mmol/L _0 ?Calcium 8.6 - 10.2 mg/dL 10.3   10.3   10.1    ? ? ?04-24-2021: 1st attempt VM full ?04-25-2021: 2nd attempt VM full. Wife number on file is the wrong number ?05-01-2021: 3rd attempt VM full ? ?Care Gaps: ?Tdap overdue ?AWV 09-06-2021 ? ?Star Rating Drugs: ?Valsartan 160 mg- Last filled 04-07-2021 90 DS [facility] ?Rosuvastatin 10 mg- Last filled 04-07-2021 90 DS [facility] ?Metformin 1000 mg- Last filled 04-07-2021 90 DS [facility] ? ?Malecca Hicks CMA ?Clinical Pharmacist Assistant ?[phone] ? ?

## 2021-04-30 ENCOUNTER — Other Ambulatory Visit: Payer: Medicare HMO | Admitting: *Deleted

## 2021-04-30 DIAGNOSIS — E782 Mixed hyperlipidemia: Secondary | ICD-10-CM | POA: Diagnosis not present

## 2021-04-30 DIAGNOSIS — E119 Type 2 diabetes mellitus without complications: Secondary | ICD-10-CM | POA: Diagnosis not present

## 2021-04-30 DIAGNOSIS — I10 Essential (primary) hypertension: Secondary | ICD-10-CM | POA: Diagnosis not present

## 2021-05-01 LAB — HEPATIC FUNCTION PANEL
ALT: 14 IU/L (ref 0–44)
AST: 10 IU/L (ref 0–40)
Albumin: 4.1 g/dL (ref 3.8–4.8)
Alkaline Phosphatase: 78 IU/L (ref 44–121)
Bilirubin Total: 0.2 mg/dL (ref 0.0–1.2)
Bilirubin, Direct: <0.1 mg/dL (ref 0.00–0.40)
Total Protein: 6.9 g/dL (ref 6.0–8.5)

## 2021-05-01 LAB — LIPID PANEL
Chol/HDL Ratio: 2.7 ratio (ref 0.0–5.0)
Cholesterol, Total: 122 mg/dL (ref 100–199)
HDL: 45 mg/dL (ref >39)
LDL Chol Calc (NIH): 59 mg/dL (ref 0–99)
Triglycerides: 96 mg/dL (ref 0–149)
VLDL Cholesterol Cal: 18 mg/dL (ref 5–40)

## 2021-05-01 LAB — BASIC METABOLIC PANEL
BUN/Creatinine Ratio: 12 (ref 10–24)
BUN: 13 mg/dL (ref 8–27)
CO2: 24 mmol/L (ref 20–29)
Calcium: 10.3 mg/dL — ABNORMAL HIGH (ref 8.6–10.2)
Chloride: 103 mmol/L (ref 96–106)
Creatinine, Ser: 1.13 mg/dL (ref 0.76–1.27)
Glucose: 168 mg/dL — ABNORMAL HIGH (ref 70–99)
Potassium: 4.1 mmol/L (ref 3.5–5.2)
Sodium: 140 mmol/L (ref 134–144)
eGFR: 70 mL/min/{1.73_m2} (ref >59)

## 2021-05-02 ENCOUNTER — Inpatient Hospital Stay: Admission: RE | Admit: 2021-05-02 | Payer: Medicare HMO | Source: Ambulatory Visit

## 2021-05-10 ENCOUNTER — Ambulatory Visit (INDEPENDENT_AMBULATORY_CARE_PROVIDER_SITE_OTHER)
Admission: RE | Admit: 2021-05-10 | Discharge: 2021-05-10 | Disposition: A | Discharge status: Home / Self Care | Payer: Medicare HMO | Source: Ambulatory Visit | Attending: Internal Medicine | Admitting: Internal Medicine

## 2021-05-10 DIAGNOSIS — I712 Thoracic aortic aneurysm, without rupture, unspecified: Secondary | ICD-10-CM | POA: Diagnosis not present

## 2021-05-10 DIAGNOSIS — I719 Aortic aneurysm of unspecified site, without rupture: Secondary | ICD-10-CM | POA: Diagnosis not present

## 2021-05-10 DIAGNOSIS — I7121 Aneurysm of the ascending aorta, without rupture: Secondary | ICD-10-CM | POA: Diagnosis not present

## 2021-05-10 MED ORDER — IOHEXOL 350 MG/ML SOLN
100.0000 mL | Freq: Once | INTRAVENOUS | Status: AC | PRN
  Administered 2021-05-10: 100 mL via INTRAVENOUS

## 2021-05-29 ENCOUNTER — Other Ambulatory Visit: Payer: Self-pay | Admitting: Internal Medicine

## 2021-06-06 ENCOUNTER — Other Ambulatory Visit: Payer: Self-pay | Admitting: Internal Medicine

## 2021-06-07 ENCOUNTER — Encounter: Payer: Self-pay | Admitting: Internal Medicine

## 2021-06-07 ENCOUNTER — Ambulatory Visit (INDEPENDENT_AMBULATORY_CARE_PROVIDER_SITE_OTHER): Payer: Medicare HMO | Admitting: Internal Medicine

## 2021-06-07 VITALS — BP 122/64 | HR 82 | Temp 98.0°F | Ht 67.6 in | Wt 187.2 lb

## 2021-06-07 DIAGNOSIS — E1122 Type 2 diabetes mellitus with diabetic chronic kidney disease: Secondary | ICD-10-CM | POA: Diagnosis not present

## 2021-06-07 DIAGNOSIS — I129 Hypertensive chronic kidney disease with stage 1 through stage 4 chronic kidney disease, or unspecified chronic kidney disease: Secondary | ICD-10-CM

## 2021-06-07 DIAGNOSIS — Z6828 Body mass index (BMI) 28.0-28.9, adult: Secondary | ICD-10-CM

## 2021-06-07 DIAGNOSIS — N182 Chronic kidney disease, stage 2 (mild): Secondary | ICD-10-CM

## 2021-06-07 DIAGNOSIS — Z2821 Immunization not carried out because of patient refusal: Secondary | ICD-10-CM | POA: Diagnosis not present

## 2021-06-07 NOTE — Patient Instructions (Signed)

## 2021-06-07 NOTE — Progress Notes (Unsigned)
Rich Brave Llittleton,acting as a Education administrator for Maximino Greenland, MD.,have documented all relevant documentation on the behalf of Maximino Greenland, MD,as directed by  Maximino Greenland, MD while in the presence of Maximino Greenland, MD.  This visit occurred during the SARS-CoV-2 public health emergency.  Safety protocols were in place, including screening questions prior to the visit, additional usage of staff PPE, and extensive cleaning of exam room while observing appropriate contact time as indicated for disinfecting solutions.  Subjective:     Patient ID: Kelly Lambert , male    DOB: 04-10-1950 , 71 y.o.   MRN: [id number]   Chief Complaint  Patient presents with   Diabetes   Hypertension    HPI  The patient is here today for a diabetes/HTN f/u. He reports compliance with meds. He denies headaches, chest pain and shortness of breath. He is working regularly and has no complaints.   Diabetes He presents for his follow-up diabetic visit. He has type 2 diabetes mellitus. There are no hypoglycemic associated symptoms. Pertinent negatives for diabetes include no blurred vision, no chest pain, no polydipsia, no polyphagia and no polyuria. There are no hypoglycemic complications. Diabetic complications include nephropathy. Risk factors for coronary artery disease include diabetes mellitus, dyslipidemia, hypertension and male sex. He is compliant with treatment most of the time. He is following a diabetic diet. Meal planning includes avoidance of concentrated sweets. He participates in exercise intermittently. His home blood glucose trend is fluctuating minimally. His breakfast blood glucose is taken between 9-10 am. His breakfast blood glucose range is generally 110-130 mg/dl. An ACE inhibitor/angiotensin II receptor blocker is being taken. Eye exam is current.  Hypertension This is a chronic problem. The current episode started more than 1 year ago. The problem has been gradually improving since onset.  The problem is controlled. Pertinent negatives include no blurred vision, chest pain, palpitations or shortness of breath. Risk factors for coronary artery disease include diabetes mellitus, dyslipidemia and male gender. Past treatments include angiotensin blockers and diuretics. The current treatment provides moderate improvement. Compliance problems include exercise.  Hypertensive end-organ damage includes kidney disease.    Past Medical History:  Diagnosis Date   BPH (benign prostatic hyperplasia)    Diabetes mellitus without complication ([place])    Hyperlipidemia associated with type 2 diabetes mellitus ([place])    Hypertension      Family History  Problem Relation Age of Onset   Thyroid cancer Mother    Cancer Father    Cancer Brother      Current Outpatient Medications:    ACCU-CHEK GUIDE test strip, TEST BLOOD SUGAR THREE TIMES DAILY, Disp: 300 strip, Rfl: 2   aspirin EC 81 MG tablet, Take 81 mg by mouth daily., Disp: , Rfl:    Blood Glucose Calibration (ACCU-CHEK GUIDE CONTROL) LIQD, USE AS DIRECTED, Disp: 1 each, Rfl: 1   Blood Glucose Monitoring Suppl (ACCU-CHEK GUIDE) w/Device KIT, 1 kit by Does not apply route 3 (three) times daily., Disp: 1 kit, Rfl: 3   chlorthalidone (HYGROTON) 25 MG tablet, TAKE 1 TABLET EVERY DAY, Disp: 90 tablet, Rfl: 2   fluticasone (FLONASE) 50 MCG/ACT nasal spray, Place 1 spray into both nostrils daily., Disp: 16 g, Rfl: 2   gabapentin (NEURONTIN) 100 MG capsule, TAKE 1 CAPSULE EVERY DAY AT BEDTIME, Disp: 90 capsule, Rfl: 2   Lancets (ACCU-CHEK MULTICLIX) lancets, Use to check blood sugars 3 times a day. Dx code: e11.65, Disp: 300 each, Rfl: 2   metFORMIN (GLUCOPHAGE)  1000 MG tablet, TAKE 1 TABLET TWICE DAILY WITH A MEAL, Disp: 180 tablet, Rfl: 2   Multiple Vitamin (MULTIVITAMIN) tablet, Take 1 tablet by mouth daily., Disp: , Rfl:    pantoprazole (PROTONIX) 40 MG tablet, TAKE 1 TABLET EVERY DAY, Disp: 90 tablet, Rfl: 2   rosuvastatin (CRESTOR) 10 MG  tablet, Take 1 tablet (10 mg total) by mouth daily., Disp: 90 tablet, Rfl: 3   tamsulosin (FLOMAX) 0.4 MG CAPS capsule, Take 1 capsule (0.4 mg total) by mouth 2 (two) times daily., Disp: 160 capsule, Rfl: 2   valsartan (DIOVAN) 160 MG tablet, TAKE 1 TABLET EVERY DAY, Disp: 90 tablet, Rfl: 2   Vitamin D, Ergocalciferol, (DRISDOL) 1.25 MG (50000 UNIT) CAPS capsule, TAKE 1 CAPSULE TWICE WEEKLY ON TUESDAY AND FRIDAY, Disp: 24 capsule, Rfl: 0   Allergies  Allergen Reactions   Amlodipine Other (See Comments)    Headaches    Farxiga [Dapagliflozin] Other (See Comments)    headaches     Review of Systems  Constitutional: Negative.   Eyes: Negative.  Negative for blurred vision.  Respiratory: Negative.  Negative for shortness of breath.   Cardiovascular: Negative.  Negative for chest pain and palpitations.  Gastrointestinal: Negative.   Endocrine: Negative for polydipsia, polyphagia and polyuria.  Neurological: Negative.   Psychiatric/Behavioral: Negative.      Today's Vitals   06/07/21 1605  BP: 122/64  Pulse: 82  Temp: 98 F (36.7 C)  Weight: 187 lb 3.2 oz (84.9 kg)  Height: 5' 7.6" (1.717 m)  PainSc: 0-No pain   Body mass index is 28.8 kg/m.  Wt Readings from Last 3 Encounters:  06/07/21 187 lb 3.2 oz (84.9 kg)  02/01/21 190 lb 6.4 oz (86.4 kg)  10/12/20 202 lb (91.6 kg)     Objective:  Physical Exam Vitals and nursing note reviewed.  Constitutional:      Appearance: Normal appearance.  Eyes:     Extraocular Movements: Extraocular movements intact.  Cardiovascular:     Rate and Rhythm: Normal rate and regular rhythm.     Heart sounds: Normal heart sounds.  Pulmonary:     Effort: Pulmonary effort is normal.     Breath sounds: Normal breath sounds.  Musculoskeletal:     Cervical back: Normal range of motion.  Skin:    General: Skin is warm.  Neurological:     General: No focal deficit present.     Mental Status: He is alert.  Psychiatric:        Mood and Affect:  Mood normal.     Assessment And Plan:     1. Type 2 diabetes mellitus with stage 2 chronic kidney disease, without long-term current use of insulin ([place]) Comments: He declines use of Farxiga due to h/o headaches. He is aware of cardiac/renal benefits. May consider Jardiance in the future. He will c/w metformin for now.  - Hemoglobin A1c  2. Hypertensive nephropathy Comments: Chronic, well controlled. He is encouraged to follow low sodium diet.  He will c/w valsartan and chlorthalidone daily. Recent labs reviewed, incl. renal fxn.   3. BMI 28.0-28.9,adult Comments: He was congratulated on his 13 lb weight loss since Oct 2022.  4. Herpes zoster vaccination declined   Patient was given opportunity to ask questions. Patient verbalized understanding of the plan and was able to repeat key elements of the plan. All questions were answered to their satisfaction.   I, Maximino Greenland, MD, have reviewed all documentation for this visit. The documentation on  06/07/21 for the exam, diagnosis, procedures, and orders are all accurate and complete.   IF YOU HAVE BEEN REFERRED TO A SPECIALIST, IT MAY TAKE 1-2 WEEKS TO SCHEDULE/PROCESS THE REFERRAL. IF YOU HAVE NOT HEARD FROM US/SPECIALIST IN TWO WEEKS, PLEASE GIVE [place] A CALL AT [phone].   THE PATIENT IS ENCOURAGED TO PRACTICE SOCIAL DISTANCING DUE TO THE COVID-19 PANDEMIC.

## 2021-06-08 LAB — HEMOGLOBIN A1C
Est. average glucose Bld gHb Est-mCnc: 148 mg/dL
Hgb A1c MFr Bld: 6.8 % — ABNORMAL HIGH (ref 4.8–5.6)

## 2021-06-20 ENCOUNTER — Other Ambulatory Visit: Payer: Self-pay | Admitting: Internal Medicine

## 2021-07-30 NOTE — Progress Notes (Signed)
Cardiology Office Note:    Date:  08/02/2021   ID:  Kelly Lambert, DOB 06-Jan-1951, MRN [id number]  PCP:  Dorothyann Peng, MD  [facility] Cardiologist:  Christell Constant, MD  [facility] Electrophysiologist:  None   Referring MD: Dorothyann Peng, MD   CC: CAD f/u  History of Present Illness:    Kelly Lambert is a 71 y.o. male with a hx of DM with HTN, HLD who presented for evaluation 02/04/20. In interim of this visit, patient had Ziopatch and CCTA. 2022: BP at goal, LDL < 70  Patient notes that he is doing well since going back to work.   Since last visit notes no chest pain; has lost 20 Lbs since going back (almost a year ago . There are no interval hospital/ED visit.    No chest pain or pressure .  No SOB/DOE and no PND/Orthopnea.  No weight gain or leg swelling.  No palpitations or syncope.   Past Medical History:  Diagnosis Date   BPH (benign prostatic hyperplasia)    Diabetes mellitus without complication (HCC)    Hyperlipidemia associated with type 2 diabetes mellitus (HCC)    Hypertension     Past Surgical History:  Procedure Laterality Date   JOINT REPLACEMENT Bilateral 2011   knee    Current Medications: Current Meds  Medication Sig   ACCU-CHEK GUIDE test strip TEST BLOOD SUGAR THREE TIMES DAILY   aspirin EC 81 MG tablet Take 81 mg by mouth daily.   Blood Glucose Calibration (ACCU-CHEK GUIDE CONTROL) LIQD USE AS DIRECTED   Blood Glucose Monitoring Suppl (ACCU-CHEK GUIDE) w/Device KIT 1 kit by Does not apply route 3 (three) times daily.   chlorthalidone (HYGROTON) 25 MG tablet TAKE 1 TABLET EVERY DAY   fluticasone (FLONASE) 50 MCG/ACT nasal spray Place 1 spray into both nostrils daily.   gabapentin (NEURONTIN) 100 MG capsule TAKE 1 CAPSULE EVERY DAY AT BEDTIME   Lancets (ACCU-CHEK MULTICLIX) lancets Use to check blood sugars 3 times a day. Dx code: e11.65   metFORMIN (GLUCOPHAGE) 1000 MG tablet TAKE 1 TABLET TWICE DAILY WITH A MEAL    Multiple Vitamin (MULTIVITAMIN) tablet Take 1 tablet by mouth daily.   pantoprazole (PROTONIX) 40 MG tablet TAKE 1 TABLET EVERY DAY   rosuvastatin (CRESTOR) 10 MG tablet TAKE 1 TABLET (10 MG TOTAL) BY MOUTH DAILY.   tamsulosin (FLOMAX) 0.4 MG CAPS capsule Take 1 capsule (0.4 mg total) by mouth 2 (two) times daily.   valsartan (DIOVAN) 160 MG tablet TAKE 1 TABLET EVERY DAY   Vitamin D, Ergocalciferol, (DRISDOL) 1.25 MG (50000 UNIT) CAPS capsule TAKE 1 CAPSULE TWICE WEEKLY ON TUESDAY AND FRIDAY     Allergies:   Amlodipine and Farxiga [dapagliflozin]   Social History   Socioeconomic History   Marital status: Married    Spouse name: Not on file   Number of children: 3   Years of education: Not on file   Highest education level: Not on file  Occupational History   Occupation: part time work  Tobacco Use   Smoking status: Former    Packs/day: 1.00    Years: 25.00    Total pack years: 25.00    Types: Cigarettes    Quit date: 1999    Years since quitting: 24.5   Smokeless tobacco: Former    Types: Chew    Quit date: 2018  Vaping Use   Vaping Use: Never used  Substance and Sexual Activity   Alcohol use: Not Currently  Drug use: Not Currently   Sexual activity: Yes  Other Topics Concern   Not on file  Social History Narrative   Patient reports he works 7 days per week; currently out of work at his M-F job at [employer] due to [employer] pandemic. 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 Patient participates in daily exercise walking 7-8 miles per day.   Social Determinants of Health   Financial Resource Strain: Low Risk  (08/09/2020)   Overall Financial Resource Strain (CARDIA)    Difficulty of Paying Living Expenses: Not hard at all  Food Insecurity: No Food Insecurity (08/09/2020)   Hunger Vital Sign    Worried About Running Out of Food in the Last Year: Never true    Ran Out of Food in the Last Year: Never true  Transportation Needs: No  Transportation Needs (08/09/2020)   PRAPARE - Administrator, Civil Service (Medical): No    Lack of Transportation (Non-Medical): No  Physical Activity: Sufficiently Active (08/09/2020)   Exercise Vital Sign    Days of Exercise per Week: 3 days    Minutes of Exercise per Session: 60 min  Stress: No Stress Concern Present (08/09/2020)   [employer] of Occupational Health - Occupational Stress Questionnaire    Feeling of Stress : Not at all  Social Connections: Socially Integrated (04/07/2018)   Social Connection and Isolation Panel [NHANES]    Frequency of Communication with Friends and Family: More than three times a week    Frequency of Social Gatherings with Friends and Family: More than three times a week    Attends Religious Services: More than 4 times per year    Active Member of [employer] or Organizations: Yes    Attends Engineer, structural: More than 4 times per year    Marital Status: Married    Social: Working at [employer]  Family History: The patient's family history includes Cancer in his brother and father; Thyroid cancer in his mother. History of coronary artery disease notable for father. History of heart failure notable for no members. History of arrhythmia notable for no members.  ROS:   Please see the history of present illness.    All other systems reviewed and are negative.  EKGs/Labs/Other Studies Reviewed:    The following studies were reviewed today:  EKG:   08/02/21: SR rate 69 02/04/19:  SR Rate 75 WNL  Cardiac Event Monitoring: Date: 03/01/20 Results: Patient had a minimum heart rate of 51 bpm, maximum heart rate of 218 bpm, and average heart rate of 84 bpm. Predominant underlying rhythm was sinus rhythm. One run of non-sustained ventricular tachycardia occurred lasting 4 beats at longest with a max rate of 174 bpm at fastest. 627 runs of supraventricular tachycardia (SVT) occurred lasting 15 beats at longest with a max  rate of 218 bpm at fastest. Isolated PACs were frequent (7.4%), with occasional couplets (1.7%) and rare (<1.0%) triplets present. Isolated PVCs were rare (<1.0%), with rare couplets or triplets present. No evidence of complete heart block . No triggered and diary events.   Asymptomatic SVT.    Cardiac CT: Date:02/22/20 Results: IMPRESSION: 1. Coronary calcium score of 94. This was 68th percentile for age, sex, and race matched control.   2. Normal coronary origin with co-dominance.   3. Atypical pulmonary vein drainage into the left atrium: left upper and lower pulmonary veins merge to a common vessel prior to entering left  atrium.   4. CAD-RADS 2. Mild non-obstructive CAD (25-49%). Consider non-atherosclerotic causes of chest pain. Consider preventive therapy and risk factor modification. CT-FFR being sent for RCA evaluation  IMPRESSION: 1. CT FFR analysis shows no evidence of significant functional Stenosis.    IMPRESSION: No acute or significant extracardiac abnormality.    Recent Labs: 10/12/2020: Hemoglobin 13.7; Platelets 170; TSH 2.110 04/30/2021: ALT 14; BUN 13; Creatinine, Ser 1.13; Potassium 4.1; Sodium 140  Recent Lipid Panel    Component Value Date/Time   CHOL 122 04/30/2021 0718   TRIG 96 04/30/2021 0718   HDL 45 04/30/2021 0718   CHOLHDL 2.7 04/30/2021 0718   LDLCALC 59 04/30/2021 0718   Physical Exam:    VS:  BP 126/68   Pulse 69   Ht 5\' 11"  (1.803 m)   Wt 189 lb (85.7 kg)   SpO2 98%   BMI 26.36 kg/m     Wt Readings from Last 3 Encounters:  08/02/21 189 lb (85.7 kg)  06/07/21 187 lb 3.2 oz (84.9 kg)  02/01/21 190 lb 6.4 oz (86.4 kg)    Gen: No distress   Neck: No JVD Cardiac: No Rubs or Gallops, no murmur, RRR +2 radial pulses Respiratory: Clear to auscultation bilaterally, normal effort, normal  respiratory rate GI: Soft, nontender, non-distended  MS: No  edema;  moves all extremities Integument: Skin feels well Neuro:  At time of  evaluation, alert and oriented to person/place/time/situation  Psych: Normal affect, patient feels well  ASSESSMENT:    No diagnosis found.  PLAN:    Mild non-obstructive CAD Hypertension with Diabetes Hyperlipidemia (mixed) P-SVT - LDL 59 in 2023, continue statin  - asymptomatic - continue ASA 81 mg - Valsartan 160 mg PO CTZ 25 mg - We discussed diet and sugars and length; if LDL above 55 in 2024 we will increase in rosuvastatin to 20 mg  One year follow up unless new symptoms or abnormal test results warranting change in plan  Medication Adjustments/Labs and Tests Ordered: Current medicines are reviewed at length with the patient today.  Concerns regarding medicines are outlined above.  No orders of the defined types were placed in this encounter.  No orders of the defined types were placed in this encounter.   There are no Patient Instructions on file for this visit.   Signed, Christell Constant, MD  08/02/2021 9:41 AM    [facility]  Reviewed Prior notes from PA:  No reference to stress testing.  Records comprise 2011-2014.

## 2021-08-02 ENCOUNTER — Encounter: Payer: Self-pay | Admitting: Internal Medicine

## 2021-08-02 ENCOUNTER — Ambulatory Visit: Payer: Medicare HMO | Admitting: Internal Medicine

## 2021-08-02 VITALS — BP 126/68 | HR 69 | Ht 71.0 in | Wt 189.0 lb

## 2021-08-02 DIAGNOSIS — E782 Mixed hyperlipidemia: Secondary | ICD-10-CM | POA: Diagnosis not present

## 2021-08-02 NOTE — Patient Instructions (Signed)
Medication Instructions:  Your physician recommends that you continue on your current medications as directed. Please refer to the Current Medication list given to you today.  *If you need a refill on your cardiac medications before your next appointment, please call your pharmacy*   Lab Work: NONE If you have labs (blood work) drawn today and your tests are completely normal, you will receive your results only by: MyChart Message (if you have MyChart) OR A paper copy in the mail If you have any lab test that is abnormal or we need to change your treatment, we will call you to review the results.   Testing/Procedures: NONE   Follow-Up: At [facility], you and your health needs are our priority.  As part of our continuing mission to provide you with exceptional heart care, we have created designated Provider Care Teams.  These Care Teams include your primary Cardiologist (physician) and Advanced Practice Providers (APPs -  Physician Assistants and Nurse Practitioners) who all work together to provide you with the care you need, when you need it.  Your next appointment:   1 year(s)  The format for your next appointment:   In Person  Provider:   Mahesh A Chandrasekhar, MD    Important Information About Sugar       

## 2021-08-24 DIAGNOSIS — R972 Elevated prostate specific antigen [PSA]: Secondary | ICD-10-CM | POA: Diagnosis not present

## 2021-09-02 ENCOUNTER — Other Ambulatory Visit: Payer: Self-pay | Admitting: Internal Medicine

## 2021-09-06 ENCOUNTER — Ambulatory Visit: Payer: Medicare HMO | Admitting: Internal Medicine

## 2021-09-06 ENCOUNTER — Ambulatory Visit: Payer: Medicare HMO

## 2021-09-22 DIAGNOSIS — E119 Type 2 diabetes mellitus without complications: Secondary | ICD-10-CM | POA: Diagnosis not present

## 2021-09-22 LAB — HM DIABETES EYE EXAM

## 2021-10-17 ENCOUNTER — Encounter: Payer: Self-pay | Admitting: Internal Medicine

## 2021-10-17 ENCOUNTER — Ambulatory Visit (INDEPENDENT_AMBULATORY_CARE_PROVIDER_SITE_OTHER): Payer: Medicare HMO | Admitting: Internal Medicine

## 2021-10-17 ENCOUNTER — Other Ambulatory Visit: Payer: Self-pay | Admitting: Internal Medicine

## 2021-10-17 ENCOUNTER — Ambulatory Visit (INDEPENDENT_AMBULATORY_CARE_PROVIDER_SITE_OTHER): Payer: Medicare HMO

## 2021-10-17 VITALS — BP 110/50 | HR 82 | Temp 98.1°F | Ht 68.2 in | Wt 186.5 lb

## 2021-10-17 VITALS — BP 110/50 | HR 82 | Temp 98.1°F | Ht 68.2 in | Wt 186.6 lb

## 2021-10-17 DIAGNOSIS — Z6828 Body mass index (BMI) 28.0-28.9, adult: Secondary | ICD-10-CM | POA: Diagnosis not present

## 2021-10-17 DIAGNOSIS — E1169 Type 2 diabetes mellitus with other specified complication: Secondary | ICD-10-CM

## 2021-10-17 DIAGNOSIS — I10 Essential (primary) hypertension: Secondary | ICD-10-CM | POA: Diagnosis not present

## 2021-10-17 DIAGNOSIS — E119 Type 2 diabetes mellitus without complications: Secondary | ICD-10-CM

## 2021-10-17 DIAGNOSIS — E1122 Type 2 diabetes mellitus with diabetic chronic kidney disease: Secondary | ICD-10-CM

## 2021-10-17 DIAGNOSIS — I129 Hypertensive chronic kidney disease with stage 1 through stage 4 chronic kidney disease, or unspecified chronic kidney disease: Secondary | ICD-10-CM | POA: Diagnosis not present

## 2021-10-17 DIAGNOSIS — Z23 Encounter for immunization: Secondary | ICD-10-CM

## 2021-10-17 DIAGNOSIS — E7800 Pure hypercholesterolemia, unspecified: Secondary | ICD-10-CM | POA: Diagnosis not present

## 2021-10-17 DIAGNOSIS — Z0000 Encounter for general adult medical examination without abnormal findings: Secondary | ICD-10-CM

## 2021-10-17 DIAGNOSIS — N182 Chronic kidney disease, stage 2 (mild): Secondary | ICD-10-CM

## 2021-10-17 LAB — POCT URINALYSIS DIPSTICK
Bilirubin, UA: NEGATIVE
Blood, UA: NEGATIVE
Glucose, UA: NEGATIVE
Ketones, UA: NEGATIVE
Leukocytes, UA: NEGATIVE
Nitrite, UA: NEGATIVE
Protein, UA: NEGATIVE
Spec Grav, UA: 1.02 (ref 1.010–1.025)
Urobilinogen, UA: 0.2 E.U./dL
pH, UA: 7 (ref 5.0–8.0)

## 2021-10-17 NOTE — Progress Notes (Signed)
Subjective:   Kelly Lambert is a 71 y.o. male who presents for Medicare Annual/Subsequent preventive examination.  Review of Systems     Cardiac Risk Factors include: advanced age (>8mn, >>68women);diabetes mellitus;dyslipidemia;hypertension;male gender     Objective:    Today's Vitals   10/17/21 1532 10/17/21 1538  BP: (!) 110/50   Pulse: 82   Temp: 98.1 F (36.7 C)   TempSrc: Oral   SpO2: 96%   Weight: 186 lb 9.6 oz (84.6 kg)   Height: 5' 8.2" (1.732 m)   PainSc:  4    Body mass index is 28.21 kg/m.     10/17/2021    3:43 PM 08/09/2020   10:43 AM 07/28/2019    2:59 PM 05/21/2018    2:50 PM 04/07/2018    3:11 PM  Advanced Directives  Does Patient Have a Medical Advance Directive? _0   Would patient like information on creating a medical advance directive? No - Patient declined Yes (MAU/Ambulatory/Procedural Areas - Information given)   No - Patient declined    Current Medications (verified) Outpatient Encounter Medications as of 10/17/2021  Medication Sig   ACCU-CHEK GUIDE test strip TEST BLOOD SUGAR THREE TIMES DAILY   aspirin EC 81 MG tablet Take 81 mg by mouth daily.   Blood Glucose Calibration (ACCU-CHEK GUIDE CONTROL) LIQD USE AS DIRECTED   Blood Glucose Monitoring Suppl (ACCU-CHEK GUIDE) w/Device KIT 1 kit by Does not apply route 3 (three) times daily.   chlorthalidone (HYGROTON) 25 MG tablet TAKE 1 TABLET EVERY DAY   gabapentin (NEURONTIN) 100 MG capsule TAKE 1 CAPSULE EVERY DAY AT BEDTIME   Lancets (ACCU-CHEK MULTICLIX) lancets Use to check blood sugars 3 times a day. Dx code: e11.65   metFORMIN (GLUCOPHAGE) 1000 MG tablet TAKE 1 TABLET TWICE DAILY WITH MEALS   pantoprazole (PROTONIX) 40 MG tablet TAKE 1 TABLET EVERY DAY   rosuvastatin (CRESTOR) 10 MG tablet TAKE 1 TABLET (10 MG TOTAL) BY MOUTH DAILY.   tamsulosin (FLOMAX) 0.4 MG CAPS capsule Take 1 capsule (0.4 mg total) by mouth 2 (two) times daily.   valsartan (DIOVAN) 160 MG tablet TAKE  1 TABLET EVERY DAY   Vitamin D, Ergocalciferol, (DRISDOL) 1.25 MG (50000 UNIT) CAPS capsule TAKE 1 CAPSULE TWICE WEEKLY ON TUESDAY AND FRIDAY   fluticasone (FLONASE) 50 MCG/ACT nasal spray Place 1 spray into both nostrils daily.   Multiple Vitamin (MULTIVITAMIN) tablet Take 1 tablet by mouth daily. (Patient not taking: Reported on 10/17/2021)   No facility-administered encounter medications on file as of 10/17/2021.    Allergies (verified) Amlodipine and Farxiga [dapagliflozin]   History: Past Medical History:  Diagnosis Date   BPH (benign prostatic hyperplasia)    Diabetes mellitus without complication (HCC)    Hyperlipidemia associated with type 2 diabetes mellitus ([place]    Hypertension    Past Surgical History:  Procedure Laterality Date   JOINT REPLACEMENT Bilateral 2011   knee   Family History  Problem Relation Age of Onset   Thyroid cancer Mother    Cancer Father    Cancer Brother    Social History   Socioeconomic History   Marital status: Married    Spouse name: Not on file   Number of children: 3   Years of education: Not on file   Highest education level: Not on file  Occupational History   Occupation: part time work  Tobacco Use   Smoking status: Former    Packs/day: 1.00    Years:  25.00    Total pack years: 25.00    Types: Cigarettes    Quit date: 1999    Years since quitting: 24.7   Smokeless tobacco: Former    Types: Chew    Quit date: 2018  Vaping Use   Vaping Use: Never used  Substance and Sexual Activity   Alcohol use: Not Currently   Drug use: Not Currently   Sexual activity: Yes  Other Topics Concern   Not on file  Social History Narrative   Patient reports he works 7 days per week; currently out of work at his M-F job at [employer] due to [employer] pandemic. 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 Patient participates in daily exercise walking 7-8 miles per day.   Social Determinants of Health    Financial Resource Strain: Low Risk  (10/17/2021)   Overall Financial Resource Strain (CARDIA)    Difficulty of Paying Living Expenses: Not hard at all  Food Insecurity: No Food Insecurity (10/17/2021)   Hunger Vital Sign    Worried About Running Out of Food in the Last Year: Never true    Ran Out of Food in the Last Year: Never true  Transportation Needs: No Transportation Needs (10/17/2021)   PRAPARE - Hydrologist (Medical): No    Lack of Transportation (Non-Medical): No  Physical Activity: Inactive (10/17/2021)   Exercise Vital Sign    Days of Exercise per Week: 0 days    Minutes of Exercise per Session: 0 min  Stress: No Stress Concern Present (10/17/2021)   [place]    Feeling of Stress : Not at all  Social Connections: [place] (04/07/2018)   Social Connection and Isolation Panel [NHANES]    Frequency of Communication with Friends and Family: More than three times a week    Frequency of Social Gatherings with Friends and Family: More than three times a week    Attends Religious Services: More than 4 times per year    Active Member of [employer] or Organizations: Yes    Attends Music therapist: More than 4 times per year    Marital Status: Married    Tobacco Counseling Counseling given: Not Answered   Clinical Intake:  Pre-visit preparation completed: Yes  Pain : 0-10 Pain Score: 4  Pain Type: Chronic pain Pain Location: Wrist Pain Orientation: Left, Right Pain Descriptors / Indicators: Aching Pain Onset: More than a month ago Pain Frequency: Constant     Nutritional Status: BMI 25 -29 Overweight Nutritional Risks: Nausea/ vomitting/ diarrhea (with metformin a little) Diabetes: Yes  How often do you need to have someone help you when you read instructions, pamphlets, or other written materials from your doctor or pharmacy?: 1 - Never What is  the last grade level you completed in school?: 12th grade  Diabetic? Yes Nutrition Risk Assessment:  Has the patient had any N/V/D within the last 2 months?  Yes  Does the patient have any non-healing wounds?  No  Has the patient had any unintentional weight loss or weight gain?  No   Diabetes:  Is the patient diabetic?  Yes  If diabetic, was a CBG obtained today?  No  Did the patient bring in their glucometer from home?  No  How often do you monitor your CBG's? Every other day.   Financial Strains and Diabetes Management:  Are you having any financial  strains with the device, your supplies or your medication? No .  Does the patient want to be seen by Chronic Care Management for management of their diabetes?  No  Would the patient like to be referred to a Nutritionist or for Diabetic Management?  No   Diabetic Exams:  Diabetic Eye Exam: Completed 2023 Diabetic Foot Exam: Completed 10/12/2020   Interpreter Needed?: No  Information entered by :: NAllen LPN   Activities of Daily Living    10/17/2021    3:45 PM  In your present state of health, do you have any difficulty performing the following activities:  Hearing? 0  Vision? 0  Difficulty concentrating or making decisions? 0  Walking or climbing stairs? 0  Dressing or bathing? 0  Doing errands, shopping? 0  Preparing Food and eating ? N  Using the Toilet? N  In the past six months, have you accidently leaked urine? N  Do you have problems with loss of bowel control? N  Managing your Medications? N  Managing your Finances? N  Housekeeping or managing your Housekeeping? N    Patient Care Team: [name], MD as PCP - General (Internal Medicine) Werner Lean, MD as PCP - Cardiology (Cardiology) Mayford Knife, [facility] (Pharmacist)  Indicate any recent Medical Services you may have received from other than [facility] providers in the past year (date may be approximate).     Assessment:   This is a  routine wellness examination for [place].  Hearing/Vision screen Vision Screening - Comments:: Regular eye exams, [facility]  Dietary issues and exercise activities discussed: Current Exercise Habits: The patient has a physically strenuous job, but has no regular exercise apart from work.   Goals Addressed             This Visit's Progress    Patient Stated       10/17/2021, no goals       Depression Screen    10/17/2021    3:44 PM 08/09/2020   10:46 AM 07/28/2019    3:01 PM 07/27/2019    1:39 PM 05/21/2018    2:51 PM 04/07/2018    3:10 PM 03/16/2018    9:57 AM  PHQ 2/9 Scores  PHQ - 2 Score 0 0 0 0 0 0 0  PHQ- 9 Score   1  0      Fall Risk    10/17/2021    3:44 PM 08/09/2020   10:45 AM 07/28/2019    3:00 PM 07/27/2019    1:39 PM 05/21/2018    2:51 PM  [place] in the past year? 1 0 0 0 1  Comment chasing dog      Number falls in past yr: 0    0  Comment     tripped over dog's toy  Injury with Fall? 0    0  Risk for fall due to : Medication side effect Medication side effect Medication side effect  History of fall(s);Medication side effect  Follow up Falls prevention discussed Falls evaluation completed;Education provided;Falls prevention discussed Falls evaluation completed;Education provided;Falls prevention discussed  Falls prevention discussed    FALL RISK PREVENTION PERTAINING TO THE HOME:  Any stairs in or around the home? No  If so, are there any without handrails? N/a Home free of loose throw rugs in walkways, pet beds, electrical cords, etc? Yes  Adequate lighting in your home to reduce risk of falls? Yes   ASSISTIVE DEVICES UTILIZED TO PREVENT FALLS:  Life alert? No  Use of a cane, walker or w/c? No  Grab bars in the bathroom? No  Shower chair or bench in shower? No  Elevated toilet seat or a handicapped toilet? Yes   TIMED UP AND GO:  Was the test performed? Yes .  Length of time to ambulate 10 feet: 5 sec.   Gait steady and fast  without use of assistive device  Cognitive Function:        10/17/2021    3:45 PM 08/09/2020   10:47 AM 07/28/2019    3:02 PM 05/21/2018    2:55 PM  6CIT Screen  What Year? 0 points 0 points 0 points 0 points  What month? 0 points 0 points 0 points 0 points  What time? 0 points 0 points 0 points 0 points  Count back from 20 0 points 0 points 0 points 0 points  Months in reverse 2 points 0 points 0 points 0 points  Repeat phrase 2 points 4 points 2 points 0 points  Total Score 4 points 4 points 2 points 0 points    Immunizations Immunization History  Administered Date(s) Administered   Fluad Quad(high Dose 65+) 09/18/2018, 04/05/2020   Influenza-Unspecified 09/30/2018, 11/02/2019, 10/09/2020   PFIZER(Purple Top)SARS-COV-2 Vaccination 02/13/2019, 03/06/2019, 10/11/2019, 05/05/2020   Pfizer Covid-19 Vaccine Bivalent Booster 30yr & up 10/09/2020   Tdap 04/07/2021    TDAP status: Up to date  Flu Vaccine status: Completed at today's visit  Pneumococcal vaccine status: Declined,  Education has been provided regarding the importance of this vaccine but patient still declined. Advised may receive this vaccine at local pharmacy or Health Dept. Aware to provide a copy of the vaccination record if obtained from local pharmacy or Health Dept. Verbalized acceptance and understanding.   Covid-19 vaccine status: Completed vaccines  Qualifies for Shingles Vaccine? Yes   Zostavax completed No   Shingrix Completed?: No.    Education has been provided regarding the importance of this vaccine. Patient has been advised to call insurance company to determine out of pocket expense if they have not yet received this vaccine. Advised may also receive vaccine at local pharmacy or Health Dept. Verbalized acceptance and understanding.  Screening Tests Health Maintenance  Topic Date Due   Zoster Vaccines- Shingrix (1 of 2) Never done   COVID-19 Vaccine (6 - Pfizer risk series) 12/04/2020   INFLUENZA  VACCINE  08/07/2021   Diabetic kidney evaluation - Urine ACR  08/09/2021   OPHTHALMOLOGY EXAM  09/02/2021   FOOT EXAM  10/12/2021   Pneumonia Vaccine 70 Years old (1 - PCV) 02/01/2022 (Originally 02/28/2016)   HEMOGLOBIN A1C  12/07/2021   Diabetic kidney evaluation - GFR measurement  05/01/2022   COLONOSCOPY (Pts 45-471yrInsurance coverage will need to be confirmed)  03/18/2023   TETANUS/TDAP  04/08/2031   Hepatitis C Screening  Completed   HPV VACCINES  Aged Out    Health Maintenance  Health Maintenance Due  Topic Date Due   Zoster Vaccines- Shingrix (1 of 2) Never done   COVID-19 Vaccine (6 - Pfizer risk series) 12/04/2020   INFLUENZA VACCINE  08/07/2021   Diabetic kidney evaluation - Urine ACR  08/09/2021   OPHTHALMOLOGY EXAM  09/02/2021   FOOT EXAM  10/12/2021    Colorectal cancer screening: Type of screening: Colonoscopy. Completed 03/18/2019. Repeat every 5 years  Lung Cancer Screening: (Low Dose CT Chest recommended if Age 70-80ears, 30 pack-year currently smoking OR have quit w/in 15years.) does not qualify.  Lung Cancer Screening Referral: no  Additional Screening:  Hepatitis C Screening: does qualify; Completed 03/16/2018  Vision Screening: Recommended annual ophthalmology exams for early detection of glaucoma and other disorders of the eye. Is the patient up to date with their annual eye exam?  Yes  Who is the provider or what is the name of the office in which the patient attends annual eye exams? [facility] If pt is not established with a provider, would they like to be referred to a provider to establish care? No .   Dental Screening: Recommended annual dental exams for proper oral hygiene  Community Resource Referral / Chronic Care Management: CRR required this visit?  No   CCM required this visit?  No      Plan:     I have personally reviewed and noted the following in the patient's chart:   Medical and social history Use of alcohol, tobacco  or illicit drugs  Current medications and supplements including opioid prescriptions. Patient is not currently taking opioid prescriptions. Functional ability and status Nutritional status Physical activity Advanced directives List of other physicians Hospitalizations, surgeries, and ER visits in previous 12 months Vitals Screenings to include cognitive, depression, and falls Referrals and appointments  In addition, I have reviewed and discussed with patient certain preventive protocols, quality metrics, and best practice recommendations. A written personalized care plan for preventive services as well as general preventive health recommendations were provided to patient.     Kellie Simmering, LPN   [id number]   Nurse Notes: none

## 2021-10-17 NOTE — Patient Instructions (Signed)

## 2021-10-17 NOTE — Patient Instructions (Signed)
[name] , Thank you for taking time to come for your Medicare Wellness Visit. I appreciate your ongoing commitment to your health goals. Please review the following plan we discussed and let me know if I can assist you in the future.   Screening recommendations/referrals: Colonoscopy: completed 03/18/2019, due 03/17/2024 Recommended yearly ophthalmology/optometry visit for glaucoma screening and checkup Recommended yearly dental visit for hygiene and checkup  Vaccinations: Influenza vaccine: today Pneumococcal vaccine:  declined Tdap vaccine: completed 04/07/2021, due 04/08/2031 Shingles vaccine: discussed   Covid-19:  10/09/2020, 05/05/2020, 10/11/2019, 03/06/2019, 02/13/2019  Advanced directives: Advance directive discussed with you today. Even though you declined this today please call our office should you change your mind and we can give you the proper paperwork for you to fill out.  Conditions/risks identified: none  Next appointment: Follow up in one year for your annual wellness visit.   Preventive Care 52 Years and Older, Male Preventive care refers to lifestyle choices and visits with your health care provider that can promote health and wellness. What does preventive care include? A yearly physical exam. This is also called an annual well check. Dental exams once or twice a year. Routine eye exams. Ask your health care provider how often you should have your eyes checked. Personal lifestyle choices, including: Daily care of your teeth and gums. Regular physical activity. Eating a healthy diet. Avoiding tobacco and drug use. Limiting alcohol use. Practicing safe sex. Taking low doses of aspirin every day. Taking vitamin and mineral supplements as recommended by your health care provider. What happens during an annual well check? The services and screenings done by your health care provider during your annual well check will depend on your age, overall health, lifestyle risk  factors, and family history of disease. Counseling  Your health care provider may ask you questions about your: Alcohol use. Tobacco use. Drug use. Emotional well-being. Home and relationship well-being. Sexual activity. Eating habits. History of falls. Memory and ability to understand (cognition). Work and work Statistician. Screening  You may have the following tests or measurements: Height, weight, and BMI. Blood pressure. Lipid and cholesterol levels. These may be checked every 5 years, or more frequently if you are over 36 years old. Skin check. Lung cancer screening. You may have this screening every year starting at age 8 if you have a 30-pack-year history of smoking and currently smoke or have quit within the past 15 years. Fecal occult blood test (FOBT) of the stool. You may have this test every year starting at age 13. Flexible sigmoidoscopy or colonoscopy. You may have a sigmoidoscopy every 5 years or a colonoscopy every 10 years starting at age 37. Prostate cancer screening. Recommendations will vary depending on your family history and other risks. Hepatitis C blood test. Hepatitis B blood test. Sexually transmitted disease (STD) testing. Diabetes screening. This is done by checking your blood sugar (glucose) after you have not eaten for a while (fasting). You may have this done every 1-3 years. Abdominal aortic aneurysm (AAA) screening. You may need this if you are a current or former smoker. Osteoporosis. You may be screened starting at age 10 if you are at high risk. Talk with your health care provider about your test results, treatment options, and if necessary, the need for more tests. Vaccines  Your health care provider may recommend certain vaccines, such as: Influenza vaccine. This is recommended every year. Tetanus, diphtheria, and acellular pertussis (Tdap, Td) vaccine. You may need a Td booster every 10  years. Zoster vaccine. You may need this after age  15. Pneumococcal 13-valent conjugate (PCV13) vaccine. One dose is recommended after age 63. Pneumococcal polysaccharide (PPSV23) vaccine. One dose is recommended after age 49. Talk to your health care provider about which screenings and vaccines you need and how often you need them. This information is not intended to replace advice given to you by your health care provider. Make sure you discuss any questions you have with your health care provider. Document Released: 01/20/2015 Document Revised: 09/13/2015 Document Reviewed: 10/25/2014 Elsevier Interactive Patient Education  2017 [place] Prevention in the Home Falls can cause injuries. They can happen to people of all ages. There are many things you can do to make your home safe and to help prevent falls. What can I do on the outside of my home? Regularly fix the edges of walkways and driveways and fix any cracks. Remove anything that might make you trip as you walk through a door, such as a raised step or threshold. Trim any bushes or trees on the path to your home. Use bright outdoor lighting. Clear any walking paths of anything that might make someone trip, such as rocks or tools. Regularly check to see if handrails are loose or broken. Make sure that both sides of any steps have handrails. Any raised decks and porches should have guardrails on the edges. Have any leaves, snow, or ice cleared regularly. Use sand or salt on walking paths during winter. Clean up any spills in your garage right away. This includes oil or grease spills. What can I do in the bathroom? Use night lights. Install grab bars by the toilet and in the tub and shower. Do not use towel bars as grab bars. Use non-skid mats or decals in the tub or shower. If you need to sit down in the shower, use a plastic, non-slip stool. Keep the floor dry. Clean up any water that spills on the floor as soon as it happens. Remove soap buildup in the tub or shower  regularly. Attach bath mats securely with double-sided non-slip rug tape. Do not have throw rugs and other things on the floor that can make you trip. What can I do in the bedroom? Use night lights. Make sure that you have a light by your bed that is easy to reach. Do not use any sheets or blankets that are too big for your bed. They should not hang down onto the floor. Have a firm chair that has side arms. You can use this for support while you get dressed. Do not have throw rugs and other things on the floor that can make you trip. What can I do in the kitchen? Clean up any spills right away. Avoid walking on wet floors. Keep items that you use a lot in easy-to-reach places. If you need to reach something above you, use a strong step stool that has a grab bar. Keep electrical cords out of the way. Do not use floor polish or wax that makes floors slippery. If you must use wax, use non-skid floor wax. Do not have throw rugs and other things on the floor that can make you trip. What can I do with my stairs? Do not leave any items on the stairs. Make sure that there are handrails on both sides of the stairs and use them. Fix handrails that are broken or loose. Make sure that handrails are as long as the stairways. Check any carpeting to make sure  that it is firmly attached to the stairs. Fix any carpet that is loose or worn. Avoid having throw rugs at the top or bottom of the stairs. If you do have throw rugs, attach them to the floor with carpet tape. Make sure that you have a light switch at the top of the stairs and the bottom of the stairs. If you do not have them, ask someone to add them for you. What else can I do to help prevent falls? Wear shoes that: Do not have high heels. Have rubber bottoms. Are comfortable and fit you well. Are closed at the toe. Do not wear sandals. If you use a stepladder: Make sure that it is fully opened. Do not climb a closed stepladder. Make sure that  both sides of the stepladder are locked into place. Ask someone to hold it for you, if possible. Clearly mark and make sure that you can see: Any grab bars or handrails. First and last steps. Where the edge of each step is. Use tools that help you move around (mobility aids) if they are needed. These include: Canes. Walkers. Scooters. Crutches. Turn on the lights when you go into a dark area. Replace any light bulbs as soon as they burn out. Set up your furniture so you have a clear path. Avoid moving your furniture around. If any of your floors are uneven, fix them. If there are any pets around you, be aware of where they are. Review your medicines with your doctor. Some medicines can make you feel dizzy. This can increase your chance of falling. Ask your doctor what other things that you can do to help prevent falls. This information is not intended to replace advice given to you by your health care provider. Make sure you discuss any questions you have with your health care provider. Document Released: 10/20/2008 Document Revised: 06/01/2015 Document Reviewed: 01/28/2014 Elsevier Interactive Patient Education  2017 [employer].

## 2021-10-17 NOTE — Progress Notes (Signed)
Rich Brave Llittleton,acting as a Education administrator for Maximino Greenland, MD.,have documented all relevant documentation on the behalf of Maximino Greenland, MD,as directed by  Maximino Greenland, MD while in the presence of Maximino Greenland, MD.    Subjective:     Patient ID: Kelly Lambert , male    DOB: [birth year] , 71 y.o.   MRN: [id number]   Chief Complaint  Patient presents with   Diabetes   Hypertension    HPI  The patient is here today for a diabetes/HTN f/u. He reports compliance with meds. He denies headaches, chest pain and shortness of breath. He is working regularly and has no complaints.   Diabetes He presents for his follow-up diabetic visit. He has type 2 diabetes mellitus. His disease course has been stable. There are no hypoglycemic associated symptoms. Pertinent negatives for diabetes include no blurred vision, no chest pain, no polydipsia, no polyphagia and no polyuria. There are no hypoglycemic complications. Diabetic complications include nephropathy. Risk factors for coronary artery disease include diabetes mellitus, dyslipidemia, hypertension and male sex. He is compliant with treatment most of the time. He is following a diabetic diet. Meal planning includes avoidance of concentrated sweets. He participates in exercise intermittently. His home blood glucose trend is fluctuating minimally. His breakfast blood glucose is taken between 9-10 am. His breakfast blood glucose range is generally 110-130 mg/dl. An ACE inhibitor/angiotensin II receptor blocker is being taken. Eye exam is current.  Hypertension This is a chronic problem. The current episode started more than 1 year ago. The problem has been gradually improving since onset. The problem is controlled. Pertinent negatives include no blurred vision, chest pain, palpitations or shortness of breath. Risk factors for coronary artery disease include diabetes mellitus, dyslipidemia and male gender. Past treatments include angiotensin  blockers and diuretics. The current treatment provides moderate improvement. Compliance problems include exercise.  Hypertensive end-organ damage includes kidney disease.     Past Medical History:  Diagnosis Date   BPH (benign prostatic hyperplasia)    Diabetes mellitus without complication ([place])    Hyperlipidemia associated with type 2 diabetes mellitus ([place])    Hypertension      Family History  Problem Relation Age of Onset   Thyroid cancer Mother    Cancer Father    Cancer Brother      Current Outpatient Medications:    ACCU-CHEK GUIDE test strip, TEST BLOOD SUGAR THREE TIMES DAILY, Disp: 300 strip, Rfl: 2   aspirin EC 81 MG tablet, Take 81 mg by mouth daily., Disp: , Rfl:    Blood Glucose Calibration (ACCU-CHEK GUIDE CONTROL) LIQD, USE AS DIRECTED, Disp: 1 each, Rfl: 1   chlorthalidone (HYGROTON) 25 MG tablet, TAKE 1 TABLET EVERY DAY, Disp: 90 tablet, Rfl: 2   fluticasone (FLONASE) 50 MCG/ACT nasal spray, Place 1 spray into both nostrils daily., Disp: 16 g, Rfl: 2   gabapentin (NEURONTIN) 100 MG capsule, TAKE 1 CAPSULE EVERY DAY AT BEDTIME, Disp: 90 capsule, Rfl: 2   Lancets (ACCU-CHEK MULTICLIX) lancets, Use to check blood sugars 3 times a day. Dx code: e11.65, Disp: 300 each, Rfl: 2   metFORMIN (GLUCOPHAGE) 1000 MG tablet, TAKE 1 TABLET TWICE DAILY WITH MEALS, Disp: 180 tablet, Rfl: 2   Multiple Vitamin (MULTIVITAMIN) tablet, Take 1 tablet by mouth daily. (Patient not taking: Reported on 10/17/2021), Disp: , Rfl:    pantoprazole (PROTONIX) 40 MG tablet, TAKE 1 TABLET EVERY DAY, Disp: 90 tablet, Rfl: 2   rosuvastatin (CRESTOR) 10 MG tablet,  TAKE 1 TABLET (10 MG TOTAL) BY MOUTH DAILY., Disp: 90 tablet, Rfl: 0   tamsulosin (FLOMAX) 0.4 MG CAPS capsule, Take 1 capsule (0.4 mg total) by mouth 2 (two) times daily., Disp: 160 capsule, Rfl: 2   valsartan (DIOVAN) 160 MG tablet, TAKE 1 TABLET EVERY DAY, Disp: 90 tablet, Rfl: 2   Vitamin D, Ergocalciferol, (DRISDOL) 1.25 MG (50000 UNIT)  CAPS capsule, TAKE 1 CAPSULE TWICE WEEKLY ON TUESDAY AND FRIDAY, Disp: 24 capsule, Rfl: 10   Allergies  Allergen Reactions   Amlodipine Other (See Comments)    Headaches    Farxiga [Dapagliflozin] Other (See Comments)    headaches     Review of Systems  Constitutional: Negative.   Eyes: Negative.  Negative for blurred vision.  Respiratory: Negative.  Negative for shortness of breath.   Cardiovascular: Negative.  Negative for chest pain and palpitations.  Gastrointestinal: Negative.   Endocrine: Negative for polydipsia, polyphagia and polyuria.  Musculoskeletal: Negative.   Skin: Negative.   Neurological: Negative.   Psychiatric/Behavioral: Negative.       Today's Vitals   10/17/21 1601  BP: (!) 110/50  Pulse: 82  Temp: 98.1 F (36.7 C)  Weight: 186 lb 8.2 oz (84.6 kg)  Height: 5' 8.2" (1.732 m)   Body mass index is 28.19 kg/m.  Wt Readings from Last 3 Encounters:  10/17/21 186 lb 8.2 oz (84.6 kg)  10/17/21 186 lb 9.6 oz (84.6 kg)  08/02/21 189 lb (85.7 kg)    BP Readings from Last 3 Encounters:  10/17/21 (!) 110/50  10/17/21 (!) 110/50  08/02/21 126/68     Objective:  Physical Exam Vitals and nursing note reviewed.  Constitutional:      Appearance: Normal appearance.  HENT:     Head: Normocephalic and atraumatic.     Nose:     Comments: Masked     Mouth/Throat:     Comments: Masked  Eyes:     Extraocular Movements: Extraocular movements intact.  Cardiovascular:     Rate and Rhythm: Normal rate and regular rhythm.     Heart sounds: Normal heart sounds.  Pulmonary:     Effort: Pulmonary effort is normal.     Breath sounds: Normal breath sounds.  Musculoskeletal:     Cervical back: Normal range of motion.  Skin:    General: Skin is warm.  Neurological:     General: No focal deficit present.     Mental Status: He is alert.  Psychiatric:        Mood and Affect: Mood normal.         Assessment And Plan:     1. Type 2 diabetes mellitus with  stage 2 chronic kidney disease, without long-term current use of insulin (HCC) Comments: Chronic, I will check labs as below. Importance of dietary and medication compliance was d/w patient. He will rto in 4 months for re-evaluation.  - CMP14+EGFR - CBC no Diff - Hemoglobin A1c  2. Hypertensive nephropathy Comments: Chronic, controlled. He is on ARB therapy for renal protection. He will c/w valsartan 190m and chlorthalidone 274mdaily.  3. Pure hypercholesterolemia Comments: Chronic, LDL goal <70.  He will c/w rosuvastatin 1046maily.   4. BMI 28.0-28.9,adult Comments: He is encouraged to aim for at least 150 minutes of exercise per week.    Patient was given opportunity to ask questions. Patient verbalized understanding of the plan and was able to repeat key elements of the plan. All questions were answered to their satisfaction.  I, Maximino Greenland, MD, have reviewed all documentation for this visit. The documentation on 10/17/21 for the exam, diagnosis, procedures, and orders are all accurate and complete.   IF YOU HAVE BEEN REFERRED TO A SPECIALIST, IT MAY TAKE 1-2 WEEKS TO SCHEDULE/PROCESS THE REFERRAL. IF YOU HAVE NOT HEARD FROM US/SPECIALIST IN TWO WEEKS, PLEASE GIVE [place] A CALL AT [phone].   THE PATIENT IS ENCOURAGED TO PRACTICE SOCIAL DISTANCING DUE TO THE COVID-19 PANDEMIC.

## 2021-10-18 LAB — CMP14+EGFR
ALT: 12 IU/L (ref 0–44)
AST: 14 IU/L (ref 0–40)
Albumin/Globulin Ratio: 1.4 (ref 1.2–2.2)
Albumin: 4.4 g/dL (ref 3.9–4.9)
Alkaline Phosphatase: 80 IU/L (ref 44–121)
BUN/Creatinine Ratio: 10 (ref 10–24)
BUN: 12 mg/dL (ref 8–27)
Bilirubin Total: 0.2 mg/dL (ref 0.0–1.2)
CO2: 25 mmol/L (ref 20–29)
Calcium: 10.4 mg/dL — ABNORMAL HIGH (ref 8.6–10.2)
Chloride: 106 mmol/L (ref 96–106)
Creatinine, Ser: 1.18 mg/dL (ref 0.76–1.27)
Globulin, Total: 3.2 g/dL (ref 1.5–4.5)
Glucose: 91 mg/dL (ref 70–99)
Potassium: 3.9 mmol/L (ref 3.5–5.2)
Sodium: 142 mmol/L (ref 134–144)
Total Protein: 7.6 g/dL (ref 6.0–8.5)
eGFR: 66 mL/min/{1.73_m2} (ref >59)

## 2021-10-18 LAB — CBC
Hematocrit: 38.7 % (ref 37.5–51.0)
Hemoglobin: 13.1 g/dL (ref 13.0–17.7)
MCH: 31.3 pg (ref 26.6–33.0)
MCHC: 33.9 g/dL (ref 31.5–35.7)
MCV: 93 fL (ref 79–97)
Platelets: 191 10*3/uL (ref 150–450)
RBC: 4.18 x10E6/uL (ref 4.14–5.80)
RDW: 14.8 % (ref 11.6–15.4)
WBC: 4.4 10*3/uL (ref 3.4–10.8)

## 2021-10-18 LAB — HEMOGLOBIN A1C
Est. average glucose Bld gHb Est-mCnc: 148 mg/dL
Hgb A1c MFr Bld: 6.8 % — ABNORMAL HIGH (ref 4.8–5.6)

## 2021-10-18 LAB — MICROALBUMIN / CREATININE URINE RATIO
Creatinine, Urine: 81.2 mg/dL
Microalb/Creat Ratio: <4 mg/g creat (ref 0–29)
Microalbumin, Urine: <3 ug/mL

## 2021-10-22 ENCOUNTER — Other Ambulatory Visit: Payer: Self-pay

## 2021-10-23 ENCOUNTER — Other Ambulatory Visit: Payer: Medicare HMO

## 2021-10-25 LAB — PTH, INTACT AND CALCIUM
Calcium: 10.2 mg/dL (ref 8.6–10.2)
PTH: 47 pg/mL (ref 15–65)

## 2021-10-25 LAB — CALCIUM, IONIZED: Calcium, Ion: 5.7 mg/dL — ABNORMAL HIGH (ref 4.5–5.6)

## 2021-10-31 ENCOUNTER — Telehealth: Payer: Self-pay

## 2021-10-31 NOTE — Chronic Care Management (AMB) (Signed)
10-31-2021: Left patient a VM to call back and schedule telephone visit with Orlando Penner. 11-06-2021:  VM too full to leave message.  [place] Pharmacist Assistant [phone]

## 2021-11-12 ENCOUNTER — Other Ambulatory Visit: Payer: Self-pay | Admitting: Internal Medicine

## 2021-11-23 DIAGNOSIS — R972 Elevated prostate specific antigen [PSA]: Secondary | ICD-10-CM | POA: Diagnosis not present

## 2021-12-19 DIAGNOSIS — R3912 Poor urinary stream: Secondary | ICD-10-CM | POA: Diagnosis not present

## 2021-12-19 DIAGNOSIS — N401 Enlarged prostate with lower urinary tract symptoms: Secondary | ICD-10-CM | POA: Diagnosis not present

## 2021-12-19 DIAGNOSIS — R972 Elevated prostate specific antigen [PSA]: Secondary | ICD-10-CM | POA: Diagnosis not present

## 2022-01-09 ENCOUNTER — Encounter: Payer: Self-pay | Admitting: Internal Medicine

## 2022-01-09 ENCOUNTER — Ambulatory Visit (INDEPENDENT_AMBULATORY_CARE_PROVIDER_SITE_OTHER): Payer: Medicare HMO | Admitting: Internal Medicine

## 2022-01-09 VITALS — BP 114/60 | HR 82 | Temp 98.4°F | Ht 68.2 in | Wt 194.4 lb

## 2022-01-09 DIAGNOSIS — I10 Essential (primary) hypertension: Secondary | ICD-10-CM

## 2022-01-09 DIAGNOSIS — Z7984 Long term (current) use of oral hypoglycemic drugs: Secondary | ICD-10-CM

## 2022-01-09 DIAGNOSIS — E1169 Type 2 diabetes mellitus with other specified complication: Secondary | ICD-10-CM

## 2022-01-09 DIAGNOSIS — J029 Acute pharyngitis, unspecified: Secondary | ICD-10-CM | POA: Diagnosis not present

## 2022-01-09 DIAGNOSIS — Z6829 Body mass index (BMI) 29.0-29.9, adult: Secondary | ICD-10-CM | POA: Diagnosis not present

## 2022-01-09 DIAGNOSIS — E119 Type 2 diabetes mellitus without complications: Secondary | ICD-10-CM

## 2022-01-09 NOTE — Patient Instructions (Signed)

## 2022-01-09 NOTE — Progress Notes (Signed)
Rich Brave Llittleton,acting as a Education administrator for Maximino Greenland, MD.,have documented all relevant documentation on the behalf of Maximino Greenland, MD,as directed by  Maximino Greenland, MD while in the presence of Maximino Greenland, MD.    Subjective:     Patient ID: Kelly Lambert , male    DOB: 1950-07-21 , 72 y.o.   MRN: [id number]   Chief Complaint  Patient presents with   Sore Throat    HPI  Patient presents today for sore throat. He reports the pain has been there  on and off for the past month. He does admit to smoking a Black and Mild cigar every so often. He denies hoarseness.   Sore Throat  This is a recurrent problem. The current episode started 1 to 4 weeks ago. The problem has been unchanged. The pain is worse on the right side. There has been no fever. The pain is mild. Associated symptoms include congestion and a hoarse voice. Pertinent negatives include no coughing. Treatments tried: allergy med. The treatment provided mild relief.     Past Medical History:  Diagnosis Date   BPH (benign prostatic hyperplasia)    Diabetes mellitus without complication ([place])    Hyperlipidemia associated with type 2 diabetes mellitus ([place])    Hypertension      Family History  Problem Relation Age of Onset   Thyroid cancer Mother    Cancer Father    Cancer Brother      Current Outpatient Medications:    ACCU-CHEK GUIDE test strip, TEST BLOOD SUGAR THREE TIMES DAILY, Disp: 300 strip, Rfl: 2   aspirin EC 81 MG tablet, Take 81 mg by mouth daily., Disp: , Rfl:    Blood Glucose Calibration (ACCU-CHEK GUIDE CONTROL) LIQD, USE AS DIRECTED, Disp: 1 each, Rfl: 1   chlorthalidone (HYGROTON) 25 MG tablet, TAKE 1 TABLET EVERY DAY, Disp: 90 tablet, Rfl: 3   fluticasone (FLONASE) 50 MCG/ACT nasal spray, Place 1 spray into both nostrils daily., Disp: 16 g, Rfl: 2   gabapentin (NEURONTIN) 100 MG capsule, TAKE 1 CAPSULE EVERY DAY AT BEDTIME, Disp: 90 capsule, Rfl: 2   Lancets (ACCU-CHEK MULTICLIX)  lancets, Use to check blood sugars 3 times a day. Dx code: e11.65, Disp: 300 each, Rfl: 2   metFORMIN (GLUCOPHAGE) 1000 MG tablet, TAKE 1 TABLET TWICE DAILY WITH MEALS, Disp: 180 tablet, Rfl: 2   Multiple Vitamin (MULTIVITAMIN) tablet, Take 1 tablet by mouth daily. (Patient not taking: Reported on 10/17/2021), Disp: , Rfl:    pantoprazole (PROTONIX) 40 MG tablet, TAKE 1 TABLET EVERY DAY, Disp: 90 tablet, Rfl: 2   rosuvastatin (CRESTOR) 10 MG tablet, Take 1 tablet (10 mg total) by mouth daily., Disp: 90 tablet, Rfl: 2   tamsulosin (FLOMAX) 0.4 MG CAPS capsule, Take 1 capsule (0.4 mg total) by mouth 2 (two) times daily., Disp: 160 capsule, Rfl: 2   valsartan (DIOVAN) 160 MG tablet, TAKE 1 TABLET EVERY DAY, Disp: 90 tablet, Rfl: 2   Vitamin D, Ergocalciferol, (DRISDOL) 1.25 MG (50000 UNIT) CAPS capsule, TAKE 1 CAPSULE TWICE WEEKLY ON TUESDAY AND FRIDAY, Disp: 24 capsule, Rfl: 10   Allergies  Allergen Reactions   Amlodipine Other (See Comments)    Headaches    Farxiga [Dapagliflozin] Other (See Comments)    headaches     Review of Systems  Constitutional: Negative.  Negative for chills, fatigue and fever.  HENT:  Positive for congestion, hoarse voice and sore throat.        He  c/o sore throat, but states it does not hurt to swallow. He tried some Vicks Formula 44 which did not provide relief.   Respiratory: Negative.  Negative for cough.   Cardiovascular: Negative.   Gastrointestinal: Negative.   Neurological: Negative.   Psychiatric/Behavioral: Negative.       Today's Vitals   01/09/22 1535  BP: 114/60  Pulse: 82  Temp: 98.4 F (36.9 C)  Weight: 194 lb 6.4 oz (88.2 kg)  Height: 5' 8.2" (1.732 m)  PainSc: 0-No pain   Body mass index is 29.39 kg/m.  Wt Readings from Last 3 Encounters:  01/09/22 194 lb 6.4 oz (88.2 kg)  10/17/21 186 lb 8.2 oz (84.6 kg)  10/17/21 186 lb 9.6 oz (84.6 kg)     Objective:  Physical Exam Vitals and nursing note reviewed.  Constitutional:       Appearance: Normal appearance.  HENT:     Head: Normocephalic and atraumatic.     Mouth/Throat:     Mouth: Mucous membranes are moist.     Pharynx: Uvula midline. No pharyngeal swelling or posterior oropharyngeal erythema.  Eyes:     Conjunctiva/sclera: Conjunctivae normal.     Pupils: Pupils are equal, round, and reactive to light.  Cardiovascular:     Rate and Rhythm: Normal rate and regular rhythm.     Heart sounds: Normal heart sounds.  Pulmonary:     Effort: Pulmonary effort is normal.     Breath sounds: Normal breath sounds.  Musculoskeletal:     Cervical back: Normal range of motion.  Skin:    General: Skin is warm.  Neurological:     General: No focal deficit present.     Mental Status: He is alert.  Psychiatric:        Mood and Affect: Mood normal.      Assessment And Plan:     1. Sore throat Comments: Sx have since resolved. Possibly due to PND. Will consider ENT referral if recurs due to tobacco use.  2. Diabetes mellitus with coincident hypertension ([place]) Comments: Chronic,  I will check labs as below. He will f/u in 4 months. Importance of medication/dietary/OV compliance was d/w patient in detail. - CMP14+EGFR - Hemoglobin A1c  3. BMI 29.0-29.9,adult Comments: He is encouraged to aim for at least 150 minutes of exercise per week.   Patient was given opportunity to ask questions. Patient verbalized understanding of the plan and was able to repeat key elements of the plan. All questions were answered to their satisfaction.   I, Maximino Greenland, MD, have reviewed all documentation for this visit. The documentation on 01/09/22 for the exam, diagnosis, procedures, and orders are all accurate and complete.   IF YOU HAVE BEEN REFERRED TO A SPECIALIST, IT MAY TAKE 1-2 WEEKS TO SCHEDULE/PROCESS THE REFERRAL. IF YOU HAVE NOT HEARD FROM US/SPECIALIST IN TWO WEEKS, PLEASE GIVE [place] A CALL AT [phone].   THE PATIENT IS ENCOURAGED TO PRACTICE SOCIAL DISTANCING DUE  TO THE COVID-19 PANDEMIC.

## 2022-01-10 LAB — CMP14+EGFR
ALT: 10 IU/L (ref 0–44)
AST: 12 IU/L (ref 0–40)
Albumin/Globulin Ratio: 1.6 (ref 1.2–2.2)
Albumin: 4.5 g/dL (ref 3.9–4.9)
Alkaline Phosphatase: 88 IU/L (ref 44–121)
BUN/Creatinine Ratio: 14 (ref 10–24)
BUN: 17 mg/dL (ref 8–27)
Bilirubin Total: <0.2 mg/dL (ref 0.0–1.2)
CO2: 25 mmol/L (ref 20–29)
Calcium: 10.3 mg/dL — ABNORMAL HIGH (ref 8.6–10.2)
Chloride: 103 mmol/L (ref 96–106)
Creatinine, Ser: 1.19 mg/dL (ref 0.76–1.27)
Globulin, Total: 2.8 g/dL (ref 1.5–4.5)
Glucose: 130 mg/dL — ABNORMAL HIGH (ref 70–99)
Potassium: 4.2 mmol/L (ref 3.5–5.2)
Sodium: 141 mmol/L (ref 134–144)
Total Protein: 7.3 g/dL (ref 6.0–8.5)
eGFR: 66 mL/min/{1.73_m2} (ref >59)

## 2022-01-10 LAB — HEMOGLOBIN A1C
Est. average glucose Bld gHb Est-mCnc: 160 mg/dL
Hgb A1c MFr Bld: 7.2 % — ABNORMAL HIGH (ref 4.8–5.6)

## 2022-01-12 ENCOUNTER — Other Ambulatory Visit: Payer: Self-pay | Admitting: Internal Medicine

## 2022-03-04 ENCOUNTER — Other Ambulatory Visit: Payer: Self-pay | Admitting: Internal Medicine

## 2022-03-28 ENCOUNTER — Other Ambulatory Visit: Payer: Self-pay | Admitting: Internal Medicine

## 2022-05-22 ENCOUNTER — Ambulatory Visit (INDEPENDENT_AMBULATORY_CARE_PROVIDER_SITE_OTHER): Payer: Medicare HMO | Admitting: Internal Medicine

## 2022-05-22 ENCOUNTER — Encounter: Payer: Self-pay | Admitting: Internal Medicine

## 2022-05-22 VITALS — BP 108/76 | HR 77 | Temp 98.0°F | Ht 68.0 in | Wt 198.4 lb

## 2022-05-22 DIAGNOSIS — Z0000 Encounter for general adult medical examination without abnormal findings: Secondary | ICD-10-CM

## 2022-05-22 DIAGNOSIS — N182 Chronic kidney disease, stage 2 (mild): Secondary | ICD-10-CM

## 2022-05-22 DIAGNOSIS — Z0001 Encounter for general adult medical examination with abnormal findings: Secondary | ICD-10-CM | POA: Diagnosis not present

## 2022-05-22 DIAGNOSIS — E7800 Pure hypercholesterolemia, unspecified: Secondary | ICD-10-CM | POA: Diagnosis not present

## 2022-05-22 DIAGNOSIS — E1122 Type 2 diabetes mellitus with diabetic chronic kidney disease: Secondary | ICD-10-CM | POA: Diagnosis not present

## 2022-05-22 DIAGNOSIS — F17211 Nicotine dependence, cigarettes, in remission: Secondary | ICD-10-CM | POA: Diagnosis not present

## 2022-05-22 DIAGNOSIS — Z6830 Body mass index (BMI) 30.0-30.9, adult: Secondary | ICD-10-CM | POA: Diagnosis not present

## 2022-05-22 DIAGNOSIS — E6609 Other obesity due to excess calories: Secondary | ICD-10-CM | POA: Diagnosis not present

## 2022-05-22 DIAGNOSIS — I712 Thoracic aortic aneurysm, without rupture, unspecified: Secondary | ICD-10-CM | POA: Insufficient documentation

## 2022-05-22 DIAGNOSIS — R42 Dizziness and giddiness: Secondary | ICD-10-CM

## 2022-05-22 DIAGNOSIS — E559 Vitamin D deficiency, unspecified: Secondary | ICD-10-CM

## 2022-05-22 DIAGNOSIS — I7121 Aneurysm of the ascending aorta, without rupture: Secondary | ICD-10-CM | POA: Diagnosis not present

## 2022-05-22 DIAGNOSIS — I129 Hypertensive chronic kidney disease with stage 1 through stage 4 chronic kidney disease, or unspecified chronic kidney disease: Secondary | ICD-10-CM | POA: Diagnosis not present

## 2022-05-22 NOTE — Progress Notes (Signed)
I,Victoria T Hamilton,acting as a scribe for [name], MD.,have documented all relevant documentation on the behalf of [name], MD,as directed by  [name], MD while in the presence of [name], MD.   Subjective:     Patient ID: Kelly Lambert , male    DOB: 08/14/1950 , 72 y.o.   MRN: [id number]   Chief Complaint  Patient presents with   Annual Exam   Diabetes   Hypertension   Hyperlipidemia    HPI  He is here today for a full physical examination. He is followed by Urology for his prostate exams. He reports compliance with medication.  He has no specific concerns or complaints at this time. He also sees a cardiologist. He denies headache, chest pain, and SOB.    Diabetes He presents for his follow-up diabetic visit. He has type 2 diabetes mellitus. There are no hypoglycemic associated symptoms. Pertinent negatives for hypoglycemia include no headaches. Pertinent negatives for diabetes include no blurred vision, no chest pain and no weakness. There are no hypoglycemic complications. Diabetic complications include nephropathy. Risk factors for coronary artery disease include diabetes mellitus, dyslipidemia, hypertension and male sex. He is compliant with treatment most of the time. He is following a diabetic diet. Meal planning includes avoidance of concentrated sweets. He participates in exercise intermittently. His home blood glucose trend is fluctuating minimally. His breakfast blood glucose is taken between 9-10 am. His breakfast blood glucose range is generally 110-130 mg/dl. An ACE inhibitor/angiotensin II receptor blocker is being taken. Eye exam is current.  Hypertension This is a chronic problem. The current episode started more than 1 year ago. The problem has been gradually improving since onset. The problem is controlled. Pertinent negatives include no blurred vision, chest pain, headaches, palpitations or shortness of breath. Risk factors for  coronary artery disease include diabetes mellitus, dyslipidemia and male gender. Past treatments include angiotensin blockers and diuretics. The current treatment provides moderate improvement. Compliance problems include exercise.   Hyperlipidemia Pertinent negatives include no chest pain or shortness of breath.     Past Medical History:  Diagnosis Date   BPH (benign prostatic hyperplasia)    Diabetes mellitus without complication (HCC)    Hyperlipidemia associated with type 2 diabetes mellitus (HCC)    Hypertension      Family History  Problem Relation Age of Onset   Thyroid cancer Mother    Cancer Father    Cancer Brother      Current Outpatient Medications:    ACCU-CHEK GUIDE test strip, TEST BLOOD SUGAR THREE TIMES DAILY, Disp: 300 strip, Rfl: 2   aspirin EC 81 MG tablet, Take 81 mg by mouth daily., Disp: , Rfl:    Blood Glucose Calibration (ACCU-CHEK GUIDE CONTROL) LIQD, USE AS DIRECTED, Disp: 1 each, Rfl: 1   chlorthalidone (HYGROTON) 25 MG tablet, TAKE 1 TABLET EVERY DAY, Disp: 90 tablet, Rfl: 3   gabapentin (NEURONTIN) 100 MG capsule, TAKE 1 CAPSULE AT BEDTIME, Disp: 90 capsule, Rfl: 3   Lancets (ACCU-CHEK MULTICLIX) lancets, Use to check blood sugars 3 times a day. Dx code: e11.65, Disp: 300 each, Rfl: 2   metFORMIN (GLUCOPHAGE) 1000 MG tablet, TAKE 1 TABLET TWICE DAILY WITH MEALS, Disp: 180 tablet, Rfl: 2   pantoprazole (PROTONIX) 40 MG tablet, TAKE 1 TABLET EVERY DAY, Disp: 90 tablet, Rfl: 3   rosuvastatin (CRESTOR) 10 MG tablet, Take 1 tablet (10 mg total) by mouth daily., Disp: 90 tablet, Rfl: 2   tamsulosin (FLOMAX)  0.4 MG CAPS capsule, Take 1 capsule (0.4 mg total) by mouth 2 (two) times daily., Disp: 160 capsule, Rfl: 2   valsartan (DIOVAN) 160 MG tablet, TAKE 1 TABLET EVERY DAY, Disp: 90 tablet, Rfl: 2   Vitamin D, Ergocalciferol, (DRISDOL) 1.25 MG (50000 UNIT) CAPS capsule, TAKE 1 CAPSULE TWICE WEEKLY ON TUESDAY AND FRIDAY, Disp: 24 capsule, Rfl: 10   fluticasone  (FLONASE) 50 MCG/ACT nasal spray, Place 1 spray into both nostrils daily., Disp: 16 g, Rfl: 2   Multiple Vitamin (MULTIVITAMIN) tablet, Take 1 tablet by mouth daily. (Patient not taking: Reported on 05/22/2022), Disp: , Rfl:    Allergies  Allergen Reactions   Amlodipine Other (See Comments)    Headaches    Farxiga [Dapagliflozin] Other (See Comments)    headaches     Men's preventive visit. Patient Health Questionnaire (PHQ-2) is  Flowsheet Row Office Visit from 05/22/2022 in [facility]  PHQ-2 Total Score 0     . Patient is on a diabetic, low sodium diet. Marital status: Married. Relevant history for alcohol use is:  Social History   Substance and Sexual Activity  Alcohol Use Not Currently  . Relevant history for tobacco use is:  Social History   Tobacco Use  Smoking Status Every Day   Packs/day: 1.00   Years: 25.00   Additional pack years: 0.00   Total pack years: 25.00   Types: Cigarettes, Cigars   Start date: 12/31/2019   Last attempt to quit: 1999   Years since quitting: 25.4  Smokeless Tobacco Former   Types: Chew   Quit date: 2018  Tobacco Comments   Quit cigarettes in 1999. He started to smoke cigars 2 years ago. 2 cigs/daily.   .   Review of Systems  Constitutional: Negative.   HENT: Negative.    Eyes: Negative.  Negative for blurred vision.  Respiratory: Negative.  Negative for shortness of breath.   Cardiovascular: Negative.  Negative for chest pain and palpitations.  Gastrointestinal: Negative.   Endocrine: Negative.   Genitourinary: Negative.   Musculoskeletal: Negative.   Skin: Negative.   Allergic/Immunologic: Negative.   Neurological: Negative.  Negative for weakness and headaches.  Hematological: Negative.   Psychiatric/Behavioral: Negative.       Today's Vitals   05/22/22 1509  BP: 108/76  Pulse: 77  Temp: 98 F (36.7 C)  SpO2: 98%  Weight: 198 lb 6.4 oz (90 kg)  Height: 5\' 8"  (1.727 m)   Body mass  index is 30.17 kg/m.  Wt Readings from Last 3 Encounters:  05/22/22 198 lb 6.4 oz (90 kg)  01/09/22 194 lb 6.4 oz (88.2 kg)  10/17/21 186 lb 8.2 oz (84.6 kg)    Objective:  Physical Exam Vitals and nursing note reviewed.  Constitutional:      Appearance: Normal appearance.  HENT:     Head: Normocephalic and atraumatic.     Right Ear: Tympanic membrane, ear canal and external ear normal.     Left Ear: Tympanic membrane, ear canal and external ear normal.     Nose: Nose normal.     Mouth/Throat:     Mouth: Mucous membranes are moist.     Pharynx: Oropharynx is clear.  Eyes:     Extraocular Movements: Extraocular movements intact.     Conjunctiva/sclera: Conjunctivae normal.     Pupils: Pupils are equal, round, and reactive to light.  Cardiovascular:     Rate and Rhythm: Normal rate and regular rhythm.  Pulses: Normal pulses.          Dorsalis pedis pulses are 2+ on the right side and 2+ on the left side.     Heart sounds: Normal heart sounds.  Pulmonary:     Effort: Pulmonary effort is normal.     Breath sounds: Normal breath sounds.  Chest:  Breasts:    Right: Normal. No swelling, bleeding, inverted nipple, mass or nipple discharge.     Left: Normal. No swelling, bleeding, inverted nipple, mass or nipple discharge.  Abdominal:     General: Abdomen is flat. Bowel sounds are normal.     Palpations: Abdomen is soft.  Genitourinary:    Comments: Deferred  Musculoskeletal:        General: Normal range of motion.     Cervical back: Normal range of motion and neck supple.  Feet:     Right foot:     Protective Sensation: 5 sites tested.  5 sites sensed.     Skin integrity: Dry skin present.     Toenail Condition: Right toenails are normal.     Left foot:     Protective Sensation: 5 sites tested.  5 sites sensed.     Skin integrity: Dry skin present.     Toenail Condition: Left toenails are normal.  Skin:    General: Skin is warm.  Neurological:     General: No focal  deficit present.     Mental Status: He is alert.  Psychiatric:        Mood and Affect: Mood normal.        Behavior: Behavior normal.         Assessment And Plan:    1. Routine general medical examination at a health care facility Comments: A full exam was performed. He is seen by Urology for his prostate exams, last seen Summer 2023.  DRE deferred.  PATIENT IS ADVISED TO GET 30-45 MINUTES REGULAR EXERCISE NO LESS THAN FOUR TO FIVE DAYS PER WEEK - BOTH WEIGHTBEARING EXERCISES AND AEROBIC ARE RECOMMENDED.  PATIENT IS ADVISED TO FOLLOW A HEALTHY DIET WITH AT LEAST SIX FRUITS/VEGGIES PER DAY, DECREASE INTAKE OF RED MEAT, AND TO INCREASE FISH INTAKE TO TWO DAYS PER WEEK.  MEATS/FISH SHOULD NOT BE FRIED, BAKED OR BROILED IS PREFERABLE.  IT IS ALSO IMPORTANT TO CUT BACK ON YOUR SUGAR INTAKE. PLEASE AVOID ANYTHING WITH ADDED SUGAR, CORN SYRUP OR OTHER SWEETENERS. IF YOU MUST USE A SWEETENER, YOU CAN TRY STEVIA. IT IS ALSO IMPORTANT TO AVOID ARTIFICIALLY SWEETENERS AND DIET BEVERAGES. LASTLY, I SUGGEST WEARING SPF 50 SUNSCREEN ON EXPOSED PARTS AND ESPECIALLY WHEN IN THE DIRECT SUNLIGHT FOR AN EXTENDED PERIOD OF TIME.  PLEASE AVOID FAST FOOD RESTAURANTS AND INCREASE YOUR WATER INTAKE.  2. Type 2 diabetes mellitus with stage 2 chronic kidney disease, without long-term current use of insulin (HCC) Comments: Chronic, diabetic foot exam was performed. Importance of dietary/medication compliance was d/w patient. He agrees to rto in 4 months for re-evaluation.  I DISCUSSED WITH THE PATIENT AT LENGTH REGARDING THE GOALS OF GLYCEMIC CONTROL AND POSSIBLE LONG-TERM COMPLICATIONS.  I  ALSO STRESSED THE IMPORTANCE OF COMPLIANCE WITH HOME GLUCOSE MONITORING, DIETARY RESTRICTIONS INCLUDING AVOIDANCE OF SUGARY DRINKS/PROCESSED FOODS,  ALONG WITH REGULAR EXERCISE.  I  ALSO STRESSED THE IMPORTANCE OF ANNUAL EYE EXAMS, SELF FOOT CARE AND COMPLIANCE WITH OFFICE VISITS.  - CMP14+EGFR - Lipid panel - Hemoglobin A1c - CBC  3.  Hypertensive nephropathy Comments: Chronic, well controlled. EKG performed, NSR w/ left  axis. He will c/w valsartan 160mg  and chlorthalidone 25mg  daily. He will f/u in 4months. - POCT Urinalysis Dipstick (81002) - Microalbumin / Creatinine Urine Ratio - EKG 12-Lead - CMP14+EGFR - Lipid panel  4. Pure hypercholesterolemia Comments: Chronic, LDL goal < 70. Advised to follow w/ ASA and rosuvastatin. - CMP14+EGFR - Lipid panel  5. Aneurysm of ascending aorta without rupture [facility]) Comments: 4.0 cm ascending thoracic aortic aneurysm seen on CT May 2023. He is now due for repeat study. - CT ANGIO CHEST AORTA W/CM & OR WO/CM; Future  6. Cigarette nicotine dependence in remission Comments: He does not qualify for LDCT since he quit smoking cigarettes in 1999. He began smoking cigars in the past two  years.  7. Class 1 obesity due to excess calories with serious comorbidity and body mass index (BMI) of 30.0 to 30.9 in adult He is encouraged to strive for BMI less than 30 to decrease cardiac risk. Advised to aim for at least 150 minutes of exercise per week.    Return for 1 year physical, 4 month DM. Patient was given opportunity to ask questions. Patient verbalized understanding of the plan and was able to repeat key elements of the plan. All questions were answered to their satisfaction.   I, [name], MD, have reviewed all documentation for this visit. The documentation on 05/22/22 for the exam, diagnosis, procedures, and orders are all accurate and complete.   THE PATIENT IS ENCOURAGED TO PRACTICE SOCIAL DISTANCING DUE TO THE COVID-19 PANDEMIC.

## 2022-05-22 NOTE — Patient Instructions (Signed)
Health Maintenance, Male Adopting a healthy lifestyle and getting preventive care are important in promoting health and wellness. Ask your health care provider about: The right schedule for you to have regular tests and exams. Things you can do on your own to prevent diseases and keep yourself healthy. What should I know about diet, weight, and exercise? Eat a healthy diet  Eat a diet that includes plenty of vegetables, fruits, low-fat dairy products, and lean protein. Do not eat a lot of foods that are high in solid fats, added sugars, or sodium. Maintain a healthy weight Body mass index (BMI) is a measurement that can be used to identify possible weight problems. It estimates body fat based on height and weight. Your health care provider can help determine your BMI and help you achieve or maintain a healthy weight. Get regular exercise Get regular exercise. This is one of the most important things you can do for your health. Most adults should: Exercise for at least 150 minutes each week. The exercise should increase your heart rate and make you sweat (moderate-intensity exercise). Do strengthening exercises at least twice a week. This is in addition to the moderate-intensity exercise. Spend less time sitting. Even light physical activity can be beneficial. Watch cholesterol and blood lipids Have your blood tested for lipids and cholesterol at 71 years of age, then have this test every 5 years. You may need to have your cholesterol levels checked more often if: Your lipid or cholesterol levels are high. You are older than 71 years of age. You are at high risk for heart disease. What should I know about cancer screening? Many types of cancers can be detected early and may often be prevented. Depending on your health history and family history, you may need to have cancer screening at various ages. This may include screening for: Colorectal cancer. Prostate cancer. Skin cancer. Lung  cancer. What should I know about heart disease, diabetes, and high blood pressure? Blood pressure and heart disease High blood pressure causes heart disease and increases the risk of stroke. This is more likely to develop in people who have high blood pressure readings or are overweight. Talk with your health care provider about your target blood pressure readings. Have your blood pressure checked: Every 3-5 years if you are 18-39 years of age. Every year if you are 40 years old or older. If you are between the ages of 65 and 75 and are a current or former smoker, ask your health care provider if you should have a one-time screening for abdominal aortic aneurysm (AAA). Diabetes Have regular diabetes screenings. This checks your fasting blood sugar level. Have the screening done: Once every three years after age 45 if you are at a normal weight and have a low risk for diabetes. More often and at a younger age if you are overweight or have a high risk for diabetes. What should I know about preventing infection? Hepatitis B If you have a higher risk for hepatitis B, you should be screened for this virus. Talk with your health care provider to find out if you are at risk for hepatitis B infection. Hepatitis C Blood testing is recommended for: Everyone born from 1945 through 1965. Anyone with known risk factors for hepatitis C. Sexually transmitted infections (STIs) You should be screened each year for STIs, including gonorrhea and chlamydia, if: You are sexually active and are younger than 71 years of age. You are older than 71 years of age and your   health care provider tells you that you are at risk for this type of infection. Your sexual activity has changed since you were last screened, and you are at increased risk for chlamydia or gonorrhea. Ask your health care provider if you are at risk. Ask your health care provider about whether you are at high risk for HIV. Your health care provider  may recommend a prescription medicine to help prevent HIV infection. If you choose to take medicine to prevent HIV, you should first get tested for HIV. You should then be tested every 3 months for as long as you are taking the medicine. Follow these instructions at home: Alcohol use Do not drink alcohol if your health care provider tells you not to drink. If you drink alcohol: Limit how much you have to 0-2 drinks a day. Know how much alcohol is in your drink. In the [place] one drink equals one 12 oz bottle of beer (355 mL), one 5 oz glass of wine (148 mL), or one 1 oz glass of hard liquor (44 mL). Lifestyle Do not use any products that contain nicotine or tobacco. These products include cigarettes, chewing tobacco, and vaping devices, such as e-cigarettes. If you need help quitting, ask your health care provider. Do not use street drugs. Do not share needles. Ask your health care provider for help if you need support or information about quitting drugs. General instructions Schedule regular health, dental, and eye exams. Stay current with your vaccines. Tell your health care provider if: You often feel depressed. You have ever been abused or do not feel safe at home. Summary Adopting a healthy lifestyle and getting preventive care are important in promoting health and wellness. Follow your health care provider's instructions about healthy diet, exercising, and getting tested or screened for diseases. Follow your health care provider's instructions on monitoring your cholesterol and blood pressure. This information is not intended to replace advice given to you by your health care provider. Make sure you discuss any questions you have with your health care provider. Document Revised: 05/15/2020 Document Reviewed: 05/15/2020 Elsevier Patient Education  2023 Elsevier Inc.  

## 2022-05-23 LAB — CBC
Hematocrit: 38.4 % (ref 37.5–51.0)
Hemoglobin: 13.2 g/dL (ref 13.0–17.7)
MCH: 30.8 pg (ref 26.6–33.0)
MCHC: 34.4 g/dL (ref 31.5–35.7)
MCV: 90 fL (ref 79–97)
Platelets: 194 10*3/uL (ref 150–450)
RBC: 4.29 x10E6/uL (ref 4.14–5.80)
RDW: 13.9 % (ref 11.6–15.4)
WBC: 4.3 10*3/uL (ref 3.4–10.8)

## 2022-05-23 LAB — HEMOGLOBIN A1C
Est. average glucose Bld gHb Est-mCnc: 171 mg/dL
Hgb A1c MFr Bld: 7.6 % — ABNORMAL HIGH (ref 4.8–5.6)

## 2022-05-23 LAB — CMP14+EGFR
ALT: 21 IU/L (ref 0–44)
AST: 29 IU/L (ref 0–40)
Albumin/Globulin Ratio: 1.5 (ref 1.2–2.2)
Albumin: 4.6 g/dL (ref 3.8–4.8)
Alkaline Phosphatase: 79 IU/L (ref 44–121)
BUN/Creatinine Ratio: 13 (ref 10–24)
BUN: 15 mg/dL (ref 8–27)
Bilirubin Total: 0.3 mg/dL (ref 0.0–1.2)
CO2: 25 mmol/L (ref 20–29)
Calcium: 10.2 mg/dL (ref 8.6–10.2)
Chloride: 100 mmol/L (ref 96–106)
Creatinine, Ser: 1.2 mg/dL (ref 0.76–1.27)
Globulin, Total: 3 g/dL (ref 1.5–4.5)
Glucose: 85 mg/dL (ref 70–99)
Potassium: 4 mmol/L (ref 3.5–5.2)
Sodium: 138 mmol/L (ref 134–144)
Total Protein: 7.6 g/dL (ref 6.0–8.5)
eGFR: 65 mL/min/{1.73_m2} (ref >59)

## 2022-05-23 LAB — LIPID PANEL
Chol/HDL Ratio: 3.4 ratio (ref 0.0–5.0)
Cholesterol, Total: 128 mg/dL (ref 100–199)
HDL: 38 mg/dL — ABNORMAL LOW (ref >39)
LDL Chol Calc (NIH): 70 mg/dL (ref 0–99)
Triglycerides: 108 mg/dL (ref 0–149)
VLDL Cholesterol Cal: 20 mg/dL (ref 5–40)

## 2022-05-23 LAB — MICROALBUMIN / CREATININE URINE RATIO
Creatinine, Urine: 72.8 mg/dL
Microalb/Creat Ratio: <4 mg/g creat (ref 0–29)
Microalbumin, Urine: <3 ug/mL

## 2022-06-02 DIAGNOSIS — E7800 Pure hypercholesterolemia, unspecified: Secondary | ICD-10-CM | POA: Insufficient documentation

## 2022-06-02 DIAGNOSIS — F17211 Nicotine dependence, cigarettes, in remission: Secondary | ICD-10-CM | POA: Insufficient documentation

## 2022-06-04 LAB — POCT URINALYSIS DIPSTICK
Bilirubin, UA: NEGATIVE
Blood, UA: NEGATIVE
Glucose, UA: POSITIVE — AB
Ketones, UA: NEGATIVE
Leukocytes, UA: NEGATIVE
Nitrite, UA: NEGATIVE
Protein, UA: NEGATIVE
Spec Grav, UA: 1.02 (ref 1.010–1.025)
Urobilinogen, UA: 0.2 E.U./dL
pH, UA: 5.5 (ref 5.0–8.0)

## 2022-06-17 ENCOUNTER — Other Ambulatory Visit: Payer: Self-pay | Admitting: Internal Medicine

## 2022-06-25 ENCOUNTER — Encounter: Payer: Self-pay | Admitting: Internal Medicine

## 2022-07-10 ENCOUNTER — Other Ambulatory Visit: Payer: Medicare HMO

## 2022-07-23 ENCOUNTER — Ambulatory Visit
Admission: RE | Admit: 2022-07-23 | Discharge: 2022-07-23 | Disposition: A | Discharge status: Home / Self Care | Payer: Medicare HMO | Source: Ambulatory Visit | Attending: Internal Medicine | Admitting: Internal Medicine

## 2022-07-23 DIAGNOSIS — I700 Atherosclerosis of aorta: Secondary | ICD-10-CM | POA: Diagnosis not present

## 2022-07-23 DIAGNOSIS — I7121 Aneurysm of the ascending aorta, without rupture: Secondary | ICD-10-CM

## 2022-07-23 DIAGNOSIS — J439 Emphysema, unspecified: Secondary | ICD-10-CM | POA: Diagnosis not present

## 2022-07-23 DIAGNOSIS — R911 Solitary pulmonary nodule: Secondary | ICD-10-CM | POA: Diagnosis not present

## 2022-07-23 MED ORDER — IOPAMIDOL (ISOVUE-370) INJECTION 76%
75.0000 mL | Freq: Once | INTRAVENOUS | Status: AC | PRN
  Administered 2022-07-23: 75 mL via INTRAVENOUS

## 2022-08-01 ENCOUNTER — Encounter: Payer: Self-pay | Admitting: Internal Medicine

## 2022-08-01 ENCOUNTER — Encounter: Payer: Self-pay | Admitting: Family Medicine

## 2022-08-01 ENCOUNTER — Telehealth (INDEPENDENT_AMBULATORY_CARE_PROVIDER_SITE_OTHER): Payer: Medicare HMO | Admitting: Family Medicine

## 2022-08-01 VITALS — BP 125/73 | HR 74 | Ht 68.0 in | Wt 190.0 lb

## 2022-08-01 DIAGNOSIS — U071 COVID-19: Secondary | ICD-10-CM | POA: Diagnosis not present

## 2022-08-01 NOTE — Assessment & Plan Note (Signed)
Home kit 07/30/2022

## 2022-08-01 NOTE — Progress Notes (Signed)
Virtual Visit via Video     This visit type was conducted due to national recommendations for restrictions regarding the COVID-19 Pandemic (e.g. social distancing) in an effort to limit this patient's exposure and mitigate transmission in our community.  Patients identity confirmed using two different identifiers.  This format is felt to be most appropriate for this patient at this time.  All issues noted in this document were discussed and addressed.  No physical exam was performed (except for noted visual exam findings with Video Visits).    Date:  08/01/2022   ID:  Kelly Lambert, DOB 25-Oct-1950, MRN [id number]  Patient Location: Home  Provider location:   Office    Chief Complaint:  Positive Covid Test  History of Present Illness:    Kelly Lambert is a 72 y.o. male who presents via video conferencing for a telehealth visit today.    The patient  have symptoms concerning for COVID-19 infection (chills, nasal congestion,cough).   Patient states he started having symptoms of chills, nasal congestion and dry cough four days ago, on Monday. On Tuesday, he took a home COVID test which came back positive. Since he has been resting, using nasal spray,taking Tylenol at home. Today, patient states he feels better, and ready to go back to work on Monday.He  does not want to get any treatment.     Past Medical History:  Diagnosis Date   BPH (benign prostatic hyperplasia)    Diabetes mellitus without complication (HCC)    Hyperlipidemia associated with type 2 diabetes mellitus (HCC)    Hypertension    Past Surgical History:  Procedure Laterality Date   JOINT REPLACEMENT Bilateral 2011   knee     No outpatient medications have been marked as taking for the 08/01/22 encounter (Video Visit) with Ellender Hose, NP.     Allergies:   Amlodipine and Farxiga [dapagliflozin]   Social History   Tobacco Use   Smoking status: Every Day    Current packs/day: 0.00    Average  packs/day: 1 pack/day for 25.0 years (25.0 ttl pk-yrs)    Types: Cigarettes, Cigars    Start date: 18    Last attempt to quit: 1999    Years since quitting: 25.5   Smokeless tobacco: Former    Types: Chew    Quit date: 2018   Tobacco comments:    Quit cigarettes in 1999. He started to smoke cigars 2 years ago. 2 cigs/daily.   Vaping Use   Vaping status: Never Used  Substance Use Topics   Alcohol use: Not Currently   Drug use: Not Currently     Family Hx: The patient's family history includes Cancer in his brother and father; Thyroid cancer in his mother.  ROS:   Please see the history of present illness.    Review of Systems  Constitutional:  Positive for chills. Negative for fever.  HENT:  Positive for congestion.   Respiratory:  Negative for shortness of breath and wheezing.     All other systems reviewed and are negative.   Labs/Other Tests and Data Reviewed:    Recent Labs: 05/22/2022: ALT 21; BUN 15; Creatinine, Ser 1.20; Hemoglobin 13.2; Platelets 194; Potassium 4.0; Sodium 138   Recent Lipid Panel Lab Results  Component Value Date/Time   CHOL 128 05/22/2022 04:21 PM   TRIG 108 05/22/2022 04:21 PM   HDL 38 (L) 05/22/2022 04:21 PM   CHOLHDL 3.4 05/22/2022 04:21 PM   LDLCALC 70 05/22/2022 04:21 PM  Wt Readings from Last 3 Encounters:  08/01/22 190 lb (86.2 kg)  05/22/22 198 lb 6.4 oz (90 kg)  01/09/22 194 lb 6.4 oz (88.2 kg)     Exam:    Vital Signs:  BP 125/73   Pulse 74   Ht 5\' 8"  (1.727 m)   Wt 190 lb (86.2 kg)   BMI 28.89 kg/m     Physical Exam Vitals reviewed: virtual visit.     ASSESSMENT & PLAN:     There are no diagnoses linked to this encounter.   COVID-19 Education: The signs and symptoms of COVID-19 were discussed with the patient and how to seek care for testing (follow up with PCP or arrange E-visit).  The importance of social distancing was discussed today.  Patient Risk:   After full review of this patients clinical  status, I feel that they are at least moderate risk at this time.  Time:   Today, I have spent 9 minutes/ seconds with the patient with telehealth technology discussing above diagnoses.  Medication Changes: No orders of the defined types were placed in this encounter.   Disposition:  Follow up     as previously scheduled  Signed, Lillionna Nabi Moshe Salisbury, NP

## 2022-08-06 ENCOUNTER — Encounter: Payer: Self-pay | Admitting: Internal Medicine

## 2022-08-06 ENCOUNTER — Ambulatory Visit: Payer: Medicare HMO | Admitting: Internal Medicine

## 2022-08-06 VITALS — BP 110/68 | HR 72 | Ht 70.0 in | Wt 197.0 lb

## 2022-08-06 DIAGNOSIS — I2510 Atherosclerotic heart disease of native coronary artery without angina pectoris: Secondary | ICD-10-CM | POA: Diagnosis not present

## 2022-08-06 DIAGNOSIS — I77810 Thoracic aortic ectasia: Secondary | ICD-10-CM

## 2022-08-06 DIAGNOSIS — I471 Supraventricular tachycardia, unspecified: Secondary | ICD-10-CM | POA: Diagnosis not present

## 2022-08-06 DIAGNOSIS — E782 Mixed hyperlipidemia: Secondary | ICD-10-CM

## 2022-08-06 DIAGNOSIS — F17211 Nicotine dependence, cigarettes, in remission: Secondary | ICD-10-CM

## 2022-08-06 NOTE — Progress Notes (Signed)
Cardiology Office Note:    Date:  08/06/2022   ID:  Kelly Lambert, DOB [birth year], MRN [id number]  PCP:  Dorothyann Peng, MD  [facility] Cardiologist:  Christell Constant, MD  [facility] Electrophysiologist:  None   Referring MD: Dorothyann Peng, MD   CC: CAD f/u  History of Present Illness:    Kelly Lambert is a 72 y.o. male with a hx of DM with HTN, HLD who presented for evaluation 02/04/20. In interim of this visit, patient had Ziop[place] and CCTA. 2022: BP at goal, LDL < 70 2023: Lost 20 lbs. Went back to work.  Patient notes that he is doing well.   Since last visit notes that he is still at work.. There are no interval hospital/ED visit.   EKG showed NSR. He stopped smoking this year: had a spell with he had sinus issues after smoking a Black and Mild and stopped smoking.  Tries to eat healthy.  No chest pain or pressure .  No SOB/DOE and no PND/Orthopnea.  No weight gain or leg swelling.  No palpitations or syncope .    Past Medical History:  Diagnosis Date   BPH (benign prostatic hyperplasia)    Diabetes mellitus without complication (HCC)    Hyperlipidemia associated with type 2 diabetes mellitus (HCC)    Hypertension     Past Surgical History:  Procedure Laterality Date   JOINT REPLACEMENT Bilateral 2011   knee    Current Medications: Current Meds  Medication Sig   ACCU-CHEK GUIDE test strip TEST BLOOD SUGAR THREE TIMES DAILY   aspirin EC 81 MG tablet Take 81 mg by mouth daily.   Blood Glucose Calibration (ACCU-CHEK GUIDE CONTROL) LIQD USE AS DIRECTED   chlorthalidone (HYGROTON) 25 MG tablet TAKE 1 TABLET EVERY DAY   fluticasone (FLONASE) 50 MCG/ACT nasal spray Place 1 spray into both nostrils daily.   gabapentin (NEURONTIN) 100 MG capsule TAKE 1 CAPSULE AT BEDTIME   Lancets (ACCU-CHEK MULTICLIX) lancets Use to check blood sugars 3 times a day. Dx code: e11.65   metFORMIN (GLUCOPHAGE) 1000 MG tablet TAKE 1 TABLET TWICE DAILY WITH MEALS    Multiple Vitamin (MULTIVITAMIN) tablet Take 1 tablet by mouth daily.   pantoprazole (PROTONIX) 40 MG tablet TAKE 1 TABLET EVERY DAY   rosuvastatin (CRESTOR) 10 MG tablet TAKE 1 TABLET EVERY DAY   tamsulosin (FLOMAX) 0.4 MG CAPS capsule Take 1 capsule (0.4 mg total) by mouth 2 (two) times daily.   valsartan (DIOVAN) 160 MG tablet TAKE 1 TABLET EVERY DAY   Vitamin D, Ergocalciferol, (DRISDOL) 1.25 MG (50000 UNIT) CAPS capsule TAKE 1 CAPSULE TWICE WEEKLY ON TUESDAY AND FRIDAY     Allergies:   Amlodipine and Farxiga [dapagliflozin]   Social History   Socioeconomic History   Marital status: Married    Spouse name: Not on file   Number of children: 3   Years of education: Not on file   Highest education level: Not on file  Occupational History   Occupation: part time work  Tobacco Use   Smoking status: Former    Current packs/day: 0.00    Average packs/day: 1 pack/day for 25.0 years (25.0 ttl pk-yrs)    Types: Cigarettes, Cigars    Start date: 59    Quit date: 1999    Years since quitting: 25.5   Smokeless tobacco: Former    Types: Chew    Quit date: 2018   Tobacco comments:    Quit cigarettes in 1999. He started to  smoke cigars 2 years ago. 2 cigs/daily.   Vaping Use   Vaping status: Never Used  Substance and Sexual Activity   Alcohol use: Not Currently   Drug use: Not Currently   Sexual activity: Yes  Other Topics Concern   Not on file  Social History Narrative   Patient reports he works 7 days per week; currently out of work at his M-F job at [employer] due to [employer] pandemic. 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 Patient participates in daily exercise walking 7-8 miles per day.   Social Determinants of Health   Financial Resource Strain: Low Risk  (10/17/2021)   Overall Financial Resource Strain (CARDIA)    Difficulty of Paying Living Expenses: Not hard at all  Food Insecurity: No Food Insecurity (10/17/2021)   Hunger  Vital Sign    Worried About Running Out of Food in the Last Year: Never true    Ran Out of Food in the Last Year: Never true  Transportation Needs: No Transportation Needs (10/17/2021)   PRAPARE - Administrator, Civil Service (Medical): No    Lack of Transportation (Non-Medical): No  Physical Activity: Inactive (10/17/2021)   Exercise Vital Sign    Days of Exercise per Week: 0 days    Minutes of Exercise per Session: 0 min  Stress: No Stress Concern Present (10/17/2021)   [employer] of Occupational Health - Occupational Stress Questionnaire    Feeling of Stress : Not at all  Social Connections: Socially Integrated (04/07/2018)   Social Connection and Isolation Panel [NHANES]    Frequency of Communication with Friends and Family: More than three times a week    Frequency of Social Gatherings with Friends and Family: More than three times a week    Attends Religious Services: More than 4 times per year    Active Member of [employer] or Organizations: Yes    Attends Engineer, structural: More than 4 times per year    Marital Status: Married    Social: Working at [employer]  Family History: The patient's family history includes Cancer in his brother and father; Thyroid cancer in his mother. History of coronary artery disease notable for father. History of heart failure notable for no members. History of arrhythmia notable for no members.  ROS:   Please see the history of present illness.     EKGs/Labs/Other Studies Reviewed:    The following studies were reviewed today:  Cardiac Studies & Procedures       ECHOCARDIOGRAM  ECHOCARDIOGRAM COMPLETE 06/23/2020  Narrative ECHOCARDIOGRAM REPORT    Patient Name:   Kelly Lambert Date of Exam: 06/23/2020 Medical Rec #:  [id number]         Height:       71.0 in Accession #:    [id number]        Weight:       202.6 lb Date of Birth:  August 04, 1950         BSA:          2.120 m Patient Age:    70  years          BP:           112/70 mmHg Patient Gender: M                 HR:           71 bpm. Exam Location:  [facility]  Procedure: 2D Echo, Cardiac Doppler and Color Doppler  Indications:    I47.1 SVT  History:        Patient has no prior history of Echocardiogram examinations. Risk Factors:Former Smoker, Dyslipidemia, Diabetes and Hypertension. Chronic Kidney Disease.  Sonographer:    Farrel Conners RDCS Referring Phys: [id number] [facility] A Lilah Mijangos  IMPRESSIONS   1. Aortic root moderately dilated (4.6 cm); suggest CTA to further assess. 2. Left ventricular ejection fraction, by estimation, is 60 to 65%. The left ventricle has normal function. The left ventricle has no regional wall motion abnormalities. Left ventricular diastolic parameters are consistent with Grade I diastolic dysfunction (impaired relaxation). 3. Right ventricular systolic function is normal. The right ventricular size is normal. There is normal pulmonary artery systolic pressure. 4. Left atrial size was mildly dilated. 5. The mitral valve is normal in structure. Trivial mitral valve regurgitation. No evidence of mitral stenosis. 6. The aortic valve is tricuspid. Aortic valve regurgitation is not visualized. No aortic stenosis is present. 7. Aortic dilatation noted. There is moderate dilatation of the aortic root, measuring 46 mm. 8. The inferior vena cava is normal in size with greater than 50% respiratory variability, suggesting right atrial pressure of 3 mmHg.  FINDINGS Left Ventricle: Left ventricular ejection fraction, by estimation, is 60 to 65%. The left ventricle has normal function. The left ventricle has no regional wall motion abnormalities. The left ventricular internal cavity size was normal in size. There is no left ventricular hypertrophy. Left ventricular diastolic parameters are consistent with Grade I diastolic dysfunction (impaired relaxation).  Right Ventricle: The right ventricular  size is normal. . Right ventricular systolic function is normal. There is normal pulmonary artery systolic pressure. The tricuspid regurgitant velocity is 2.26 m/s, and with an assumed right atrial pressure of 3 mmHg, the estimated right ventricular systolic pressure is 23.4 mmHg.  Left Atrium: Left atrial size was mildly dilated.  Right Atrium: Right atrial size was normal in size.  Pericardium: There is no evidence of pericardial effusion.  Mitral Valve: The mitral valve is normal in structure. Trivial mitral valve regurgitation. No evidence of mitral valve stenosis.  Tricuspid Valve: The tricuspid valve is normal in structure. Tricuspid valve regurgitation is trivial. No evidence of tricuspid stenosis.  Aortic Valve: The aortic valve is tricuspid. Aortic valve regurgitation is not visualized. No aortic stenosis is present.  Pulmonic Valve: The pulmonic valve was normal in structure. Pulmonic valve regurgitation is not visualized. No evidence of pulmonic stenosis.  Aorta: Aortic dilatation noted. There is moderate dilatation of the aortic root, measuring 46 mm.  Venous: The inferior vena cava is normal in size with greater than 50% respiratory variability, suggesting right atrial pressure of 3 mmHg.  IAS/Shunts: No atrial level shunt detected by color flow Doppler.  Additional Comments: Aortic root moderately dilated (4.6 cm); suggest CTA to further assess.   LEFT VENTRICLE PLAX 2D LVIDd:         4.10 cm  Diastology LVIDs:         2.40 cm  LV e' medial:    5.87 cm/s LV PW:         0.90 cm  LV E/e' medial:  9.9 LV IVS:        0.80 cm  LV e' lateral:   9.03 cm/s LVOT diam:     2.50 cm  LV E/e' lateral: 6.4 LV SV:         103 LV SV Index:   48 LVOT Area:  4.91 cm   RIGHT VENTRICLE RV S prime:     12.30 cm/s TAPSE (M-mode): 1.8 cm  LEFT ATRIUM             Index       RIGHT ATRIUM           Index LA diam:        4.10 cm 1.93 cm/m  RA Area:     22.40 cm LA Vol (A2C):    85.2 ml 40.19 ml/m RA Volume:   65.30 ml  30.80 ml/m LA Vol (A4C):   84.8 ml 40.00 ml/m LA Biplane Vol: 84.6 ml 39.90 ml/m AORTIC VALVE LVOT Vmax:   94.20 cm/s LVOT Vmean:  54.200 cm/s LVOT VTI:    0.209 m  AORTA Ao Root diam: 4.60 cm Ao Asc diam:  4.10 cm  MITRAL VALVE               TRICUSPID VALVE MV Area (PHT): cm         TR Peak grad:   20.4 mmHg MV Decel Time: 315 msec    TR Vmax:        226.00 cm/s MV E velocity: 57.90 cm/s MV A velocity: 61.80 cm/s  SHUNTS MV E/A ratio:  0.94        Systemic VTI:  0.21 m Systemic Diam: 2.50 cm  Olga Millers MD Electronically signed by Olga Millers MD Signature Date/Time: 06/23/2020/3:53:09 PM    Final    MONITORS  LONG TERM MONITOR (3-14 DAYS) 03/01/2020  Narrative  Patient had a minimum heart rate of 51 bpm, maximum heart rate of 218 bpm, and average heart rate of 84 bpm.  Predominant underlying rhythm was sinus rhythm.  One run of non-sustained ventricular tachycardia occurred lasting 4 beats at longest with a max rate of 174 bpm at fastest.  627 runs of supraventricular tachycardia (SVT) occurred lasting 15 beats at longest with a max rate of 218 bpm at fastest.  Isolated PACs were frequent (7.4%), with occasional couplets (1.7%) and rare (<1.0%) triplets present.  Isolated PVCs were rare (<1.0%), with rare couplets or triplets present.  No evidence of complete heart block .  No triggered and diary events.  Asymptomatic SVT.   CT SCANS  CT CORONARY MORPH W/CTA COR W/SCORE 02/22/2020  Addendum 02/22/2020  4:35 PM ADDENDUM REPORT: 02/22/2020 16:33  CLINICAL DATA:  72 Year-old African American Male  EXAM: Cardiac/Coronary  CTA  TECHNIQUE: The patient was scanned on a [employer].  FINDINGS: A 100 kV prospective scan was triggered in the descending thoracic aorta at 111 HU's. Axial non-contrast 3 mm slices were carried out through the heart. The data set was analyzed on a dedicated  work station and scored using the Agatson method. Gantry rotation speed was 250 msecs and collimation was .6 mm. No beta blockade and 0.8 mg of sl NTG was given. The 3D data set was reconstructed in 5% intervals of the 67-82 % of the R-R cycle. Diastolic phases were analyzed on a dedicated work station using MPR, MIP and VRT modes. The patient received 80 cc of contrast.  Aorta:  Normal size.  No calcifications.  No dissection.  Aortic Valve:  Tri-leaflet.  No calcifications.  Coronary Arteries:  Normal coronary origin.  Right dominance.  Coronary calcium score of 94. This was 68th percentile for age, sex, and race matched control.  RCA is a co-dominant artery that gives rise to PDA and PLA. There is a minimal non-obstructive  1-24% calcified plaque in the proximal vessel. There is a minimal non-obstructive 1-24% soft plaque in the mid vessel. There is a mild non-obstructive 25-49% soft plaque in the mid-distal vessel with a napkin ring's sign.  Left main is a large artery that gives rise to LAD and LCX arteries. There is no plaque  LAD is a large vessel that gives rise to a large D1 branch and smaller D1 and D3 vessels; the vessel wraps around the apex. There are minimal non-obstructive 1-24% calcified plaques in the proximal vessel. There is a minimal non-obstructive 1-24% calcified plaque in the mid vessel.  LCX is a non-dominant artery that gives rise to one large OM1 branch. There is a mild non-obstructive 25-49% soft plaque in the distal vessel.  Other findings:  Atypical pulmonary vein drainage into the left atrium: left upper and lower pulmonary veins merge to a common vessel prior to entering left atrium.  Normal left atrial appendage without a thrombus.  Normal size of the pulmonary artery.  Extra-cardiac findings: See attached radiology report for non-cardiac structures.  IMPRESSION: 1. Coronary calcium score of 94. This was 68th percentile for age, sex,  and race matched control.  2. Normal coronary origin with co-dominance.  3. Atypical pulmonary vein drainage into the left atrium: left upper and lower pulmonary veins merge to a common vessel prior to entering left atrium.  4. CAD-RADS 2. Mild non-obstructive CAD (25-49%). Consider non-atherosclerotic causes of chest pain. Consider preventive therapy and risk factor modification. CT-FFR being sent for RCA evaluation   Electronically Signed By: Riley Lam MD On: 02/22/2020 16:33  Narrative EXAM: OVER-READ INTERPRETATION  CT CHEST  The following report is an over-read performed by radiologist Dr. Charlett Nose of [facility] on 02/22/2020. This over-read does not include interpretation of cardiac or coronary anatomy or pathology. The coronary CTA interpretation by the cardiologist is attached.  COMPARISON:  None.  FINDINGS: Vascular: Heart is normal size.  Aorta normal caliber.  Mediastinum/Nodes: No adenopathy  Lungs/Pleura: No confluent opacities or effusions.  Upper Abdomen: Imaging into the upper abdomen demonstrates no acute findings.  Musculoskeletal: Chest wall soft tissues are unremarkable. No acute bony abnormality.  IMPRESSION: No acute or significant extracardiac abnormality.  Electronically Signed: By: Charlett Nose M.D. On: 02/22/2020 14:10           Recent Labs: 05/22/2022: ALT 21; BUN 15; Creatinine, Ser 1.20; Hemoglobin 13.2; Platelets 194; Potassium 4.0; Sodium 138  Recent Lipid Panel    Component Value Date/Time   CHOL 128 05/22/2022 1621   TRIG 108 05/22/2022 1621   HDL 38 (L) 05/22/2022 1621   CHOLHDL 3.4 05/22/2022 1621   LDLCALC 70 05/22/2022 1621   Physical Exam:    VS:  BP 110/68   Pulse 72   Ht 5\' 10"  (1.778 m)   Wt 197 lb (89.4 kg)   SpO2 94%   BMI 28.27 kg/m     Wt Readings from Last 3 Encounters:  08/06/22 197 lb (89.4 kg)  08/01/22 190 lb (86.2 kg)  05/22/22 198 lb 6.4 oz (90 kg)    Gen: No  distress   Neck: No JVD Cardiac: No Rubs or Gallops, no murmur, RRR +2 radial pulses Respiratory: Clear to auscultation bilaterally, normal effort, normal  respiratory rate GI: Soft, nontender, non-distended  MS: No  edema;  moves all extremities Integument: Skin feels well Neuro:  At time of evaluation, alert and oriented to person/place/time/situation  Psych: Normal affect, patient feels well  ASSESSMENT:    1. Coronary artery disease involving native coronary artery of native heart without angina pectoris   2. PSVT (paroxysmal supraventricular tachycardia)   3. Aortic root dilation (HCC)   4. Mixed hyperlipidemia     PLAN:    Mild non-obstructive CAD Hypertension with Diabetes Hyperlipidemia (mixed) - LDL at 70 on current therapy, if increased will increase statin; otherwise will make dietary changes (how much he eats) - asymptomatic - continue ASA 81 mg - Valsartan 160 mg PO CTZ 25 mg  Former smoker - continue cessation  P-SVT - Monitor  Mild Aortic dilation- 40 mm - normal when indexed for age and BSA, has yearly screening with PCP  One year follow up unless new symptoms or abnormal test results warranting change in plan  Medication Adjustments/Labs and Tests Ordered: Current medicines are reviewed at length with the patient today.  Concerns regarding medicines are outlined above.  Orders Placed This Encounter  Procedures   EKG 12-Lead   No orders of the defined types were placed in this encounter.   Patient Instructions  Medication Instructions:  Your physician recommends that you continue on your current medications as directed. Please refer to the Current Medication list given to you today.  *If you need a refill on your cardiac medications before your next appointment, please call your pharmacy*   Lab Work: NONE If you have labs (blood work) drawn today and your tests are completely normal, you will receive your results only by: MyChart Message (if  you have MyChart) OR A paper copy in the mail If you have any lab test that is abnormal or we need to change your treatment, we will call you to review the results.   Testing/Procedures: NONE   Follow-Up: At [facility], you and your health needs are our priority.  As part of our continuing mission to provide you with exceptional heart care, we have created designated Provider Care Teams.  These Care Teams include your primary Cardiologist (physician) and Advanced Practice Providers (APPs -  Physician Assistants and Nurse Practitioners) who all work together to provide you with the care you need, when you need it.  We recommend signing up for the patient portal called "MyChart".  Sign up information is provided on this After Visit Summary.  MyChart is used to connect with patients for Virtual Visits (Telemedicine).  Patients are able to view lab/test results, encounter notes, upcoming appointments, etc.  Non-urgent messages can be sent to your provider as well.   To learn more about what you can do with MyChart, go to [email or web address].    Your next appointment:   1 year(s)  Provider:   Riley Lam, MD       Signed, Christell Constant, MD  08/06/2022 3:56 PM    [facility]  Reviewed Prior notes from [place]:  No reference to stress testing.  Records comprise 2011-2014.

## 2022-08-06 NOTE — Patient Instructions (Signed)
Medication Instructions:  Your physician recommends that you continue on your current medications as directed. Please refer to the Current Medication list given to you today.  *If you need a refill on your cardiac medications before your next appointment, please call your pharmacy*   Lab Work: NONE If you have labs (blood work) drawn today and your tests are completely normal, you will receive your results only by: [place] (if you have MyChart) OR A paper copy in the mail If you have any lab test that is abnormal or we need to change your treatment, we will call you to review the results.   Testing/Procedures: NONE   Follow-Up: At [facility], you and your health needs are our priority.  As part of our continuing mission to provide you with exceptional heart care, we have created designated Provider Care Teams.  These Care Teams include your primary Cardiologist (physician) and Advanced Practice Providers (APPs -  Physician Assistants and Nurse Practitioners) who all work together to provide you with the care you need, when you need it.  We recommend signing up for the patient portal called "MyChart".  Sign up information is provided on this After Visit Summary.  MyChart is used to connect with patients for Virtual Visits (Telemedicine).  Patients are able to view lab/test results, encounter notes, upcoming appointments, etc.  Non-urgent messages can be sent to your provider as well.   To learn more about what you can do with MyChart, go to [email or web address].    Your next appointment:   1 year(s)  Provider:   Rudean Haskell, MD

## 2022-09-20 NOTE — Patient Instructions (Signed)

## 2022-09-22 DIAGNOSIS — E119 Type 2 diabetes mellitus without complications: Secondary | ICD-10-CM | POA: Diagnosis not present

## 2022-09-22 LAB — HM DIABETES EYE EXAM

## 2022-09-24 ENCOUNTER — Encounter: Payer: Self-pay | Admitting: Internal Medicine

## 2022-09-24 ENCOUNTER — Ambulatory Visit (INDEPENDENT_AMBULATORY_CARE_PROVIDER_SITE_OTHER): Payer: Medicare HMO | Admitting: Internal Medicine

## 2022-09-24 VITALS — BP 116/64 | HR 70 | Temp 98.7°F | Ht 70.0 in | Wt 196.8 lb

## 2022-09-24 DIAGNOSIS — I7121 Aneurysm of the ascending aorta, without rupture: Secondary | ICD-10-CM

## 2022-09-24 DIAGNOSIS — Z23 Encounter for immunization: Secondary | ICD-10-CM | POA: Diagnosis not present

## 2022-09-24 DIAGNOSIS — N182 Chronic kidney disease, stage 2 (mild): Secondary | ICD-10-CM

## 2022-09-24 DIAGNOSIS — Z7984 Long term (current) use of oral hypoglycemic drugs: Secondary | ICD-10-CM

## 2022-09-24 DIAGNOSIS — E1122 Type 2 diabetes mellitus with diabetic chronic kidney disease: Secondary | ICD-10-CM | POA: Diagnosis not present

## 2022-09-24 DIAGNOSIS — E7800 Pure hypercholesterolemia, unspecified: Secondary | ICD-10-CM

## 2022-09-24 DIAGNOSIS — I129 Hypertensive chronic kidney disease with stage 1 through stage 4 chronic kidney disease, or unspecified chronic kidney disease: Secondary | ICD-10-CM

## 2022-09-24 DIAGNOSIS — R911 Solitary pulmonary nodule: Secondary | ICD-10-CM

## 2022-09-24 NOTE — Progress Notes (Unsigned)
I,[name], CMA,acting as a Neurosurgeon for Kelly Aliment, MD.,have documented all relevant documentation on the behalf of Kelly Aliment, MD,as directed by  Kelly Aliment, MD while in the presence of Kelly Aliment, MD.  Subjective:  Patient ID: Kelly Lambert , male    DOB: 07-31-1950 , 72 y.o.   MRN: [id number]  Chief Complaint  Patient presents with   Diabetes   Hypertension    HPI  The patient is here today for a diabetes/HTN f/u. He reports compliance with meds. He denies headaches, chest pain and shortness of breath. He is working regularly and has no complaints.   Diabetes He presents for his follow-up diabetic visit. He has type 2 diabetes mellitus. His disease course has been stable. There are no hypoglycemic associated symptoms. Pertinent negatives for diabetes include no blurred vision, no chest pain, no polydipsia, no polyphagia and no polyuria. There are no hypoglycemic complications. Diabetic complications include nephropathy. Risk factors for coronary artery disease include diabetes mellitus, dyslipidemia, hypertension and male sex. He is compliant with treatment most of the time. He is following a diabetic diet. Meal planning includes avoidance of concentrated sweets. He participates in exercise intermittently. His home blood glucose trend is fluctuating minimally. His breakfast blood glucose is taken between 9-10 am. His breakfast blood glucose range is generally 110-130 mg/dl. An ACE inhibitor/angiotensin II receptor blocker is being taken. Eye exam is current.  Hypertension This is a chronic problem. The current episode started more than 1 year ago. The problem has been gradually improving since onset. The problem is controlled. Pertinent negatives include no blurred vision, chest pain, palpitations or shortness of breath. Risk factors for coronary artery disease include diabetes mellitus, dyslipidemia and male gender. Past treatments include angiotensin blockers  and diuretics. The current treatment provides moderate improvement. Compliance problems include exercise.  Hypertensive end-organ damage includes kidney disease.     Past Medical History:  Diagnosis Date   BPH (benign prostatic hyperplasia)    Diabetes mellitus without complication (HCC)    Hyperlipidemia associated with type 2 diabetes mellitus (HCC)    Hypertension      Family History  Problem Relation Age of Onset   Thyroid cancer Mother    Cancer Father    Cancer Brother      Current Outpatient Medications:    ACCU-CHEK GUIDE test strip, TEST BLOOD SUGAR THREE TIMES DAILY, Disp: 300 strip, Rfl: 2   aspirin EC 81 MG tablet, Take 81 mg by mouth daily., Disp: , Rfl:    Blood Glucose Calibration (ACCU-CHEK GUIDE CONTROL) LIQD, USE AS DIRECTED, Disp: 1 each, Rfl: 1   chlorthalidone (HYGROTON) 25 MG tablet, TAKE 1 TABLET EVERY DAY, Disp: 90 tablet, Rfl: 3   gabapentin (NEURONTIN) 100 MG capsule, TAKE 1 CAPSULE AT BEDTIME, Disp: 90 capsule, Rfl: 3   Lancets (ACCU-CHEK MULTICLIX) lancets, Use to check blood sugars 3 times a day. Dx code: e11.65, Disp: 300 each, Rfl: 2   metFORMIN (GLUCOPHAGE) 1000 MG tablet, TAKE 1 TABLET TWICE DAILY WITH MEALS, Disp: 180 tablet, Rfl: 3   Multiple Vitamin (MULTIVITAMIN) tablet, Take 1 tablet by mouth daily., Disp: , Rfl:    rosuvastatin (CRESTOR) 10 MG tablet, TAKE 1 TABLET EVERY DAY, Disp: 90 tablet, Rfl: 1   tamsulosin (FLOMAX) 0.4 MG CAPS capsule, Take 1 capsule (0.4 mg total) by mouth 2 (two) times daily., Disp: 160 capsule, Rfl: 2   valsartan (DIOVAN) 160 MG tablet, TAKE 1 TABLET EVERY DAY, Disp: 90 tablet,  Rfl: 3   Vitamin D, Ergocalciferol, (DRISDOL) 1.25 MG (50000 UNIT) CAPS capsule, TAKE 1 CAPSULE TWICE WEEKLY ON TUESDAY AND FRIDAY, Disp: 24 capsule, Rfl: 10   fluticasone (FLONASE) 50 MCG/ACT nasal spray, Place 1 spray into both nostrils daily., Disp: 16 g, Rfl: 2   pantoprazole (PROTONIX) 40 MG tablet, TAKE 1 TABLET EVERY DAY, Disp: 90 tablet,  Rfl: 3   Allergies  Allergen Reactions   Amlodipine Other (See Comments)    Headaches    Farxiga [Dapagliflozin] Other (See Comments)    headaches     Review of Systems  Constitutional: Negative.   HENT: Negative.    Eyes:  Negative for blurred vision.  Respiratory: Negative.  Negative for shortness of breath.   Cardiovascular: Negative.  Negative for chest pain and palpitations.  Endocrine: Negative.  Negative for polydipsia, polyphagia and polyuria.  Skin: Negative.   Allergic/Immunologic: Negative.   Neurological: Negative.   Hematological: Negative.      Today's Vitals   09/24/22 1458  BP: 116/64  Pulse: 70  Temp: 98.7 F (37.1 C)  SpO2: 97%  Weight: 196 lb 12.8 oz (89.3 kg)  Height: 5\' 10"  (1.778 m)   Body mass index is 28.24 kg/m.  Wt Readings from Last 3 Encounters:  09/24/22 196 lb 12.8 oz (89.3 kg)  08/06/22 197 lb (89.4 kg)  08/01/22 190 lb (86.2 kg)     Objective:  Physical Exam Vitals and nursing note reviewed.  Constitutional:      Appearance: Normal appearance.  HENT:     Head: Normocephalic and atraumatic.  Eyes:     Extraocular Movements: Extraocular movements intact.  Cardiovascular:     Rate and Rhythm: Normal rate and regular rhythm.     Heart sounds: Normal heart sounds.  Pulmonary:     Effort: Pulmonary effort is normal.     Breath sounds: Normal breath sounds.  Musculoskeletal:     Cervical back: Normal range of motion.  Skin:    General: Skin is warm.  Neurological:     General: No focal deficit present.     Mental Status: He is alert.  Psychiatric:        Mood and Affect: Mood normal.         Assessment And Plan:  Type 2 diabetes mellitus with stage 2 chronic kidney disease, without long-term current use of insulin (HCC) Assessment & Plan: Chronic, stable. I will check labs as below. He will continue with metformin 1000mg  twice daily for now. He did have trial of Farxiga, but it caused headaches.   Orders: -      BMP8+EGFR -     Hemoglobin A1c  Hypertensive nephropathy Assessment & Plan: Chronic, well controlled. He will continue with chlorthalidone 25mg  daily and valsartan 160mg  daily. He is reminded to follow a low sodium diet. He will f/u in 4 months for re-evaluation.    Pure hypercholesterolemia Assessment & Plan: Chronic, LDL goal is less than 70.  He will continue with rosuvastatin 10mg  daily. He is encouraged to follow heart healthy lifestyle.    Aneurysm of ascending aorta without rupture [facility]) Assessment & Plan: Unchanged, most recent CT angio reviewed in detail. This was last performed July 2024, he is due for repeat testing in one year. He is encouraged to keep BP well controlled.    Lung nodule Assessment & Plan: 6mm seen on CT angio May 2023. Advised to recheck in 18-24 months. He prefers to wait until after Holidays 2024. Will schedule CT  in Jan 2025.    Orders: -     CT CHEST WO CONTRAST; Future  Immunization due -     Flu Vaccine Trivalent High Dose (Fluad)     Return for 4 month dm f/u. Marland Kitchen  Patient was given opportunity to ask questions. Patient verbalized understanding of the plan and was able to repeat key elements of the plan. All questions were answered to their satisfaction.    I, Kelly Aliment, MD, have reviewed all documentation for this visit. The documentation on 09/24/22 for the exam, diagnosis, procedures, and orders are all accurate and complete.   IF YOU HAVE BEEN REFERRED TO A SPECIALIST, IT MAY TAKE 1-2 WEEKS TO SCHEDULE/PROCESS THE REFERRAL. IF YOU HAVE NOT HEARD FROM US/SPECIALIST IN TWO WEEKS, PLEASE GIVE [place] A CALL AT [phone].   THE PATIENT IS ENCOURAGED TO PRACTICE SOCIAL DISTANCING DUE TO THE COVID-19 PANDEMIC.

## 2022-09-24 NOTE — Assessment & Plan Note (Signed)
Chronic, LDL goal is less than 70.  He will continue with rosuvastatin 10mg  daily. He is encouraged to follow heart healthy lifestyle.

## 2022-09-24 NOTE — Assessment & Plan Note (Signed)
Unchanged, most recent CT angio reviewed in detail. This was last performed July 2024, he is due for repeat testing in one year. He is encouraged to keep BP well controlled.

## 2022-09-24 NOTE — Assessment & Plan Note (Signed)
6mm seen on CT angio May 2023. Advised to recheck in 18-24 months. He prefers to wait until after Holidays 2024. Will schedule CT in Jan 2025.

## 2022-09-24 NOTE — Assessment & Plan Note (Signed)
Chronic, stable. I will check labs as below. He will continue with metformin 1000mg  twice daily for now. He did have trial of Farxiga, but it caused headaches.

## 2022-09-24 NOTE — Assessment & Plan Note (Signed)
Chronic, well controlled. He will continue with chlorthalidone 25mg  daily and valsartan 160mg  daily. He is reminded to follow a low sodium diet. He will f/u in 4 months for re-evaluation.

## 2022-10-23 ENCOUNTER — Ambulatory Visit: Payer: Medicare HMO | Admitting: Internal Medicine

## 2022-10-23 ENCOUNTER — Ambulatory Visit (INDEPENDENT_AMBULATORY_CARE_PROVIDER_SITE_OTHER): Payer: Medicare HMO

## 2022-10-23 DIAGNOSIS — Z0000 Encounter for general adult medical examination without abnormal findings: Secondary | ICD-10-CM

## 2022-10-23 NOTE — Progress Notes (Signed)
Subjective:   Kelly Lambert is a 72 y.o. male who presents for Medicare Annual/Subsequent preventive examination.  Visit Complete: Virtual I connected with  Kelly Lambert on 10/23/22 by a audio enabled telemedicine application and verified that I am speaking with the correct person using two identifiers.  Patient Location: Home  Provider Location: Office/Clinic  I discussed the limitations of evaluation and management by telemedicine. The patient expressed understanding and agreed to proceed.  Vital Signs: Because this visit was a virtual/telehealth visit, some criteria may be missing or patient reported. Any vitals not documented were not able to be obtained and vitals that have been documented are patient reported.    Cardiac Risk Factors include: advanced age (>51men, >35 women);diabetes mellitus;dyslipidemia;hypertension;male gender     Objective:    Today's Vitals   There is no height or weight on file to calculate BMI.     10/23/2022    3:41 PM 10/17/2021    3:43 PM 08/09/2020   10:43 AM 07/28/2019    2:59 PM 05/21/2018    2:50 PM 04/07/2018    3:11 PM  Advanced Directives  Does Patient Have a Medical Advance Directive? No No No No No No  Would patient like information on creating a medical advance directive?  No - Patient declined Yes (MAU/Ambulatory/Procedural Areas - Information given)   No - Patient declined    Current Medications (verified) Outpatient Encounter Medications as of 10/23/2022  Medication Sig   ACCU-CHEK GUIDE test strip TEST BLOOD SUGAR THREE TIMES DAILY   aspirin EC 81 MG tablet Take 81 mg by mouth daily.   Blood Glucose Calibration (ACCU-CHEK GUIDE CONTROL) LIQD USE AS DIRECTED   chlorthalidone (HYGROTON) 25 MG tablet TAKE 1 TABLET EVERY DAY   gabapentin (NEURONTIN) 100 MG capsule TAKE 1 CAPSULE AT BEDTIME   Lancets (ACCU-CHEK MULTICLIX) lancets Use to check blood sugars 3 times a day. Dx code: e11.65   metFORMIN (GLUCOPHAGE) 1000 MG  tablet TAKE 1 TABLET TWICE DAILY WITH MEALS   Multiple Vitamin (MULTIVITAMIN) tablet Take 1 tablet by mouth daily.   pantoprazole (PROTONIX) 40 MG tablet TAKE 1 TABLET EVERY DAY   rosuvastatin (CRESTOR) 10 MG tablet TAKE 1 TABLET EVERY DAY   tamsulosin (FLOMAX) 0.4 MG CAPS capsule Take 1 capsule (0.4 mg total) by mouth 2 (two) times daily.   valsartan (DIOVAN) 160 MG tablet TAKE 1 TABLET EVERY DAY   Vitamin D, Ergocalciferol, (DRISDOL) 1.25 MG (50000 UNIT) CAPS capsule TAKE 1 CAPSULE TWICE WEEKLY ON TUESDAY AND FRIDAY   fluticasone (FLONASE) 50 MCG/ACT nasal spray Place 1 spray into both nostrils daily.   No facility-administered encounter medications on file as of 10/23/2022.    Allergies (verified) Amlodipine and Farxiga [dapagliflozin]   History: Past Medical History:  Diagnosis Date   BPH (benign prostatic hyperplasia)    Diabetes mellitus without complication (HCC)    Hyperlipidemia associated with type 2 diabetes mellitus (HCC)    Hypertension    Past Surgical History:  Procedure Laterality Date   JOINT REPLACEMENT Bilateral 2011   knee   Family History  Problem Relation Age of Onset   Thyroid cancer Mother    Cancer Father    Cancer Brother    Social History   Socioeconomic History   Marital status: Married    Spouse name: Not on file   Number of children: 3   Years of education: Not on file   Highest education level: Not on file  Occupational History   Occupation: part  time work  Tobacco Use   Smoking status: Former    Current packs/day: 0.00    Average packs/day: 1 pack/day for 25.0 years (25.0 ttl pk-yrs)    Types: Cigarettes, Cigars    Start date: 15    Quit date: 1999    Years since quitting: 25.8   Smokeless tobacco: Former    Types: Chew    Quit date: 2018   Tobacco comments:    Quit cigarettes in 1999. He started to smoke cigars 2 years ago. 2 cigs/daily.   Vaping Use   Vaping status: Never Used  Substance and Sexual Activity   Alcohol use:  Not Currently   Drug use: Not Currently   Sexual activity: Yes  Other Topics Concern   Not on file  Social History Narrative   Patient reports he works 7 days per week; currently out of work at his M-F job at [employer] due to [employer] pandemic. 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 Patient participates in daily exercise walking 7-8 miles per day.   Social Determinants of Health   Financial Resource Strain: Low Risk  (10/23/2022)   Overall Financial Resource Strain (CARDIA)    Difficulty of Paying Living Expenses: Not hard at all  Food Insecurity: No Food Insecurity (10/23/2022)   Hunger Vital Sign    Worried About Running Out of Food in the Last Year: Never true    Ran Out of Food in the Last Year: Never true  Transportation Needs: No Transportation Needs (10/23/2022)   PRAPARE - Administrator, Civil Service (Medical): No    Lack of Transportation (Non-Medical): No  Physical Activity: Sufficiently Active (10/23/2022)   Exercise Vital Sign    Days of Exercise per Week: 5 days    Minutes of Exercise per Session: 60 min  Stress: No Stress Concern Present (10/23/2022)   [employer] of Occupational Health - Occupational Stress Questionnaire    Feeling of Stress : Not at all  Social Connections: Socially Integrated (10/23/2022)   Social Connection and Isolation Panel [NHANES]    Frequency of Communication with Friends and Family: More than three times a week    Frequency of Social Gatherings with Friends and Family: Never    Attends Religious Services: More than 4 times per year    Active Member of [employer] or Organizations: Yes    Attends Engineer, structural: More than 4 times per year    Marital Status: Married    Tobacco Counseling Counseling given: Not Answered Tobacco comments: Quit cigarettes in 1999. He started to smoke cigars 2 years ago. 2 cigs/daily.    Clinical Intake:  Pre-visit preparation  completed: Yes  Pain : No/denies pain     Nutritional Risks: None Diabetes: Yes CBG done?: No Did pt. bring in CBG monitor from home?: No  How often do you need to have someone help you when you read instructions, pamphlets, or other written materials from your doctor or pharmacy?: 1 - Never  Interpreter Needed?: No  Information entered by :: NAllen LPN   Activities of Daily Living    10/23/2022    3:37 PM  In your present state of health, do you have any difficulty performing the following activities:  Hearing? 0  Vision? 0  Difficulty concentrating or making decisions? 0  Walking or climbing stairs? 0  Dressing or bathing? 0  Doing errands, shopping? 0  Preparing Food and eating ? N  Using the Toilet? N  In the past six months, have you accidently leaked urine? N  Do you have problems with loss of bowel control? N  Managing your Medications? N  Managing your Finances? N  Housekeeping or managing your Housekeeping? N    Patient Care Team: Dorothyann Peng, MD as PCP - General (Internal Medicine) Christell Constant, MD as PCP - Cardiology (Cardiology) Harlan Stains, [facility] (Inactive) (Pharmacist)  Indicate any recent Medical Services you may have received from other than [facility] providers in the past year (date may be approximate).     Assessment:   This is a routine wellness examination for Kelly Lambert.  Hearing/Vision screen Hearing Screening - Comments:: Denies hearing issues Vision Screening - Comments:: Regular eye exams, [facility], [facility]   Goals Addressed             This Visit's Progress    Patient Stated       10/23/2022, trying keep A1C down       Depression Screen    10/23/2022    3:43 PM 09/24/2022    2:58 PM 05/22/2022    3:08 PM 10/17/2021    3:44 PM 08/09/2020   10:46 AM 07/28/2019    3:01 PM 07/27/2019    1:39 PM  PHQ 2/9 Scores  PHQ - 2 Score 0 0 0 0 0 0 0  PHQ- 9 Score 0 0    1     Fall Risk    10/23/2022    3:42 PM  09/24/2022    2:58 PM 05/22/2022    3:08 PM 10/17/2021    3:44 PM 08/09/2020   10:45 AM  Fall Risk   Falls in the past year? 0 0 0 1 0  Comment    chasing dog   Number falls in past yr: 0 0 0 0   Injury with Fall? 0 0 0 0   Risk for fall due to : Medication side effect No Fall Risks No Fall Risks Medication side effect Medication side effect  Follow up Falls prevention discussed;Falls evaluation completed Falls evaluation completed Falls evaluation completed Falls prevention discussed Falls evaluation completed;Education provided;Falls prevention discussed    MEDICARE RISK AT HOME: Medicare Risk at Home Any stairs in or around the home?: No If so, are there any without handrails?: No Home free of loose throw rugs in walkways, pet beds, electrical cords, etc?: Yes Adequate lighting in your home to reduce risk of falls?: Yes Life alert?: No Use of a cane, walker or w/c?: No Grab bars in the bathroom?: Yes Shower chair or bench in shower?: No Elevated toilet seat or a handicapped toilet?: Yes  TIMED UP AND GO:  Was the test performed?  No    Cognitive Function:        10/23/2022    3:43 PM 10/17/2021    3:45 PM 08/09/2020   10:47 AM 07/28/2019    3:02 PM 05/21/2018    2:55 PM  6CIT Screen  What Year? 0 points 0 points 0 points 0 points 0 points  What month? 0 points 0 points 0 points 0 points 0 points  What time? 0 points 0 points 0 points 0 points 0 points  Count back from 20 0 points 0 points 0 points 0 points 0 points  Months in reverse 0 points 2 points 0 points 0 points 0 points  Repeat phrase 6 points 2 points 4 points 2 points 0 points  Total Score 6 points 4  points 4 points 2 points 0 points    Immunizations Immunization History  Administered Date(s) Administered   Fluad Quad(high Dose 65+) 09/18/2018, 04/05/2020, 10/17/2021   Fluad Trivalent(High Dose 65+) 09/24/2022   Influenza-Unspecified 09/30/2018, 11/02/2019, 10/09/2020   PFIZER(Purple Top)SARS-COV-2  Vaccination 02/13/2019, 03/06/2019, 10/11/2019, 05/05/2020   Pfizer Covid-19 Vaccine Bivalent Booster 50yrs & up 10/09/2020   Tdap 04/07/2021    TDAP status: Up to date  Flu Vaccine status: Up to date  Pneumococcal vaccine status: Declined,  Education has been provided regarding the importance of this vaccine but patient still declined. Advised may receive this vaccine at local pharmacy or Health Dept. Aware to provide a copy of the vaccination record if obtained from local pharmacy or Health Dept. Verbalized acceptance and understanding.   Covid-19 vaccine status: Information provided on how to obtain vaccines.   Qualifies for Shingles Vaccine? Yes   Zostavax completed No   Shingrix Completed?: No.    Education has been provided regarding the importance of this vaccine. Patient has been advised to call insurance company to determine out of pocket expense if they have not yet received this vaccine. Advised may also receive vaccine at local pharmacy or Health Dept. Verbalized acceptance and understanding.  Screening Tests Health Maintenance  Topic Date Due   COVID-19 Vaccine (6 - 2023-24 season) 09/08/2022   Zoster Vaccines- Shingrix (1 of 2) 01/23/2023 (Originally 02/27/1969)   Pneumonia Vaccine 34+ Years old (1 of 1 - PCV) 05/22/2023 (Originally 02/28/2016)   Colonoscopy  03/18/2023   HEMOGLOBIN A1C  03/24/2023   Diabetic kidney evaluation - Urine ACR  05/22/2023   FOOT EXAM  05/22/2023   OPHTHALMOLOGY EXAM  09/22/2023   Diabetic kidney evaluation - eGFR measurement  09/24/2023   Medicare Annual Wellness (AWV)  10/23/2023   DTaP/Tdap/Td (2 - Td or Tdap) 04/08/2031   INFLUENZA VACCINE  Completed   Hepatitis C Screening  Completed   HPV VACCINES  Aged Out   Lung Cancer Screening  Discontinued    Health Maintenance  Health Maintenance Due  Topic Date Due   COVID-19 Vaccine (6 - 2023-24 season) 09/08/2022    Colorectal cancer screening: Type of screening: Colonoscopy. Completed  03/18/2019. Repeat every 4 years  Lung Cancer Screening: (Low Dose CT Chest recommended if Age 38-80 years, 20 pack-year currently smoking OR have quit w/in 15years.) does not qualify.   Lung Cancer Screening Referral: no  Additional Screening:  Hepatitis C Screening: does qualify; Completed 03/16/2018  Vision Screening: Recommended annual ophthalmology exams for early detection of glaucoma and other disorders of the eye. Is the patient up to date with their annual eye exam?  Yes  Who is the provider or what is the name of the office in which the patient attends annual eye exams? [facility] [place] If pt is not established with a provider, would they like to be referred to a provider to establish care? No .   Dental Screening: Recommended annual dental exams for proper oral hygiene  Diabetic Foot Exam: Diabetic Foot Exam: Completed 05/22/2022  Community Resource Referral / Chronic Care Management: CRR required this visit?  No   CCM required this visit?  No     Plan:     I have personally reviewed and noted the following in the patient's chart:   Medical and social history Use of alcohol, tobacco or illicit drugs  Current medications and supplements including opioid prescriptions. Patient is not currently taking opioid prescriptions. Functional ability and status Nutritional status Physical activity  Advanced directives List of other physicians Hospitalizations, surgeries, and ER visits in previous 12 months Vitals Screenings to include cognitive, depression, and falls Referrals and appointments  In addition, I have reviewed and discussed with patient certain preventive protocols, quality metrics, and best practice recommendations. A written personalized care plan for preventive services as well as general preventive health recommendations were provided to patient.     Barb Merino, LPN   09/81/1914   After Visit Summary: (MyChart) Due to this being a telephonic visit, the  after visit summary with patients personalized plan was offered to patient via MyChart   Nurse Notes: none

## 2022-10-23 NOTE — Patient Instructions (Signed)
[name] , Thank you for taking time to come for your Medicare Wellness Visit. I appreciate your ongoing commitment to your health goals. Please review the following plan we discussed and let me know if I can assist you in the future.   Referrals/Orders/Follow-Ups/Clinician Recommendations: none  This is a list of the screening recommended for you and due dates:  Health Maintenance  Topic Date Due   COVID-19 Vaccine (6 - 2023-24 season) 09/08/2022   Zoster (Shingles) Vaccine (1 of 2) 01/23/2023*   Pneumonia Vaccine (1 of 1 - PCV) 05/22/2023*   Colon Cancer Screening  03/18/2023   Hemoglobin A1C  03/24/2023   Yearly kidney health urinalysis for diabetes  05/22/2023   Complete foot exam   05/22/2023   Eye exam for diabetics  09/22/2023   Yearly kidney function blood test for diabetes  09/24/2023   Medicare Annual Wellness Visit  10/23/2023   DTaP/Tdap/Td vaccine (2 - Td or Tdap) 04/08/2031   Flu Shot  Completed   Hepatitis C Screening  Completed   HPV Vaccine  Aged Out   Screening for Lung Cancer  Discontinued  *Topic was postponed. The date shown is not the original due date.    Advanced directives: (ACP Link)Information on Advanced Care Planning can be found at [facility] of [place] Advance Health Care Directives Advance Health Care Directives ([email or web address]   Next Medicare Annual Wellness Visit scheduled for next year: Yes  insert Preventive Care attachment Insert FALL PREVENTION attachment if needed

## 2022-11-02 ENCOUNTER — Other Ambulatory Visit: Payer: Self-pay | Admitting: Internal Medicine

## 2022-11-11 ENCOUNTER — Other Ambulatory Visit: Payer: Self-pay | Admitting: Internal Medicine

## 2022-12-04 ENCOUNTER — Other Ambulatory Visit: Payer: Self-pay | Admitting: Internal Medicine

## 2022-12-16 DIAGNOSIS — R972 Elevated prostate specific antigen [PSA]: Secondary | ICD-10-CM | POA: Diagnosis not present

## 2022-12-23 DIAGNOSIS — N401 Enlarged prostate with lower urinary tract symptoms: Secondary | ICD-10-CM | POA: Diagnosis not present

## 2022-12-23 DIAGNOSIS — R3912 Poor urinary stream: Secondary | ICD-10-CM | POA: Diagnosis not present

## 2022-12-23 DIAGNOSIS — R972 Elevated prostate specific antigen [PSA]: Secondary | ICD-10-CM | POA: Diagnosis not present

## 2023-01-17 ENCOUNTER — Other Ambulatory Visit: Payer: Self-pay | Admitting: Internal Medicine

## 2023-01-21 ENCOUNTER — Encounter: Payer: Self-pay | Admitting: Internal Medicine

## 2023-01-21 ENCOUNTER — Ambulatory Visit (INDEPENDENT_AMBULATORY_CARE_PROVIDER_SITE_OTHER): Payer: Medicare HMO | Admitting: Internal Medicine

## 2023-01-21 VITALS — BP 110/70 | HR 79 | Temp 98.1°F | Ht 70.0 in | Wt 208.0 lb

## 2023-01-21 DIAGNOSIS — Z79899 Other long term (current) drug therapy: Secondary | ICD-10-CM

## 2023-01-21 DIAGNOSIS — I129 Hypertensive chronic kidney disease with stage 1 through stage 4 chronic kidney disease, or unspecified chronic kidney disease: Secondary | ICD-10-CM

## 2023-01-21 DIAGNOSIS — E1122 Type 2 diabetes mellitus with diabetic chronic kidney disease: Secondary | ICD-10-CM | POA: Diagnosis not present

## 2023-01-21 DIAGNOSIS — F17211 Nicotine dependence, cigarettes, in remission: Secondary | ICD-10-CM

## 2023-01-21 DIAGNOSIS — N182 Chronic kidney disease, stage 2 (mild): Secondary | ICD-10-CM | POA: Diagnosis not present

## 2023-01-21 DIAGNOSIS — E7800 Pure hypercholesterolemia, unspecified: Secondary | ICD-10-CM | POA: Diagnosis not present

## 2023-01-21 NOTE — Progress Notes (Signed)
 I,Kelly Lambert, CMA,acting as a neurosurgeon for Kelly LOISE Slocumb, MD.,have documented all relevant documentation on the behalf of Kelly LOISE Slocumb, MD,as directed by  Kelly LOISE Slocumb, MD while in the presence of Kelly LOISE Slocumb, MD.  Subjective:  Patient ID: Kelly Lambert  , male    DOB: December 23, 1950 , 73 y.o.   MRN: [id number]  Chief Complaint  Patient presents with   Diabetes   Hypertension    HPI  The patient is here today for a diabetes, HTN & cholesterol f/u. He reports compliance with meds. He denies having any headaches, chest pain and shortness of breath. He has no specific questions or concerns.   He has not yet scheduled colonoscopy.    Diabetes He presents for his follow-up diabetic visit. He has type 2 diabetes mellitus. His disease course has been stable. There are no hypoglycemic associated symptoms. Pertinent negatives for diabetes include no blurred vision, no chest pain, no polydipsia, no polyphagia and no polyuria. There are no hypoglycemic complications. Diabetic complications include nephropathy. Risk factors for coronary artery disease include diabetes mellitus, dyslipidemia, hypertension and male sex. He is compliant with treatment most of the time. He is following a diabetic diet. Meal planning includes avoidance of concentrated sweets. He participates in exercise intermittently. His home blood glucose trend is fluctuating minimally. His breakfast blood glucose is taken between 9-10 am. His breakfast blood glucose range is generally 110-130 mg/dl. An ACE inhibitor/angiotensin II receptor blocker is being taken. Eye exam is current.  Hypertension This is a chronic problem. The current episode started more than 1 year ago. The problem has been gradually improving since onset. The problem is controlled. Pertinent negatives include no blurred vision, chest pain, palpitations or shortness of breath. Risk factors for coronary artery disease include diabetes mellitus, dyslipidemia  and male gender. Past treatments include angiotensin blockers and diuretics. The current treatment provides moderate improvement. Compliance problems include exercise.  Hypertensive end-organ damage includes kidney disease.     Past Medical History:  Diagnosis Date   BPH (benign prostatic hyperplasia)    Diabetes mellitus without complication (HCC)    Hyperlipidemia associated with type 2 diabetes mellitus (HCC)    Hypertension      Family History  Problem Relation Age of Onset   Thyroid  cancer Mother    Cancer Father    Cancer Brother      Current Outpatient Medications:    ACCU-CHEK GUIDE test strip, TEST BLOOD SUGAR THREE TIMES DAILY, Disp: 300 strip, Rfl: 2   aspirin EC 81 MG tablet, Take 81 mg by mouth daily., Disp: , Rfl:    Blood Glucose Calibration (ACCU-CHEK GUIDE CONTROL) LIQD, USE AS DIRECTED, Disp: 1 each, Rfl: 1   chlorthalidone  (HYGROTON ) 25 MG tablet, TAKE 1 TABLET EVERY DAY, Disp: 90 tablet, Rfl: 3   gabapentin  (NEURONTIN ) 100 MG capsule, TAKE 1 CAPSULE AT BEDTIME, Disp: 90 capsule, Rfl: 3   Lancets (ACCU-CHEK MULTICLIX) lancets, Use to check blood sugars 3 times a day. Dx code: e11.65, Disp: 300 each, Rfl: 2   metFORMIN  (GLUCOPHAGE ) 1000 MG tablet, TAKE 1 TABLET TWICE DAILY WITH MEALS, Disp: 180 tablet, Rfl: 3   Multiple Vitamin (MULTIVITAMIN) tablet, Take 1 tablet by mouth daily., Disp: , Rfl:    pantoprazole  (PROTONIX ) 40 MG tablet, TAKE 1 TABLET EVERY DAY, Disp: 90 tablet, Rfl: 3   rosuvastatin  (CRESTOR ) 10 MG tablet, TAKE 1 TABLET EVERY DAY, Disp: 90 tablet, Rfl: 3   tamsulosin  (FLOMAX ) 0.4 MG CAPS capsule, Take 1  capsule (0.4 mg total) by mouth 2 (two) times daily., Disp: 160 capsule, Rfl: 2   valsartan  (DIOVAN ) 160 MG tablet, TAKE 1 TABLET EVERY DAY, Disp: 90 tablet, Rfl: 3   Vitamin D , Ergocalciferol , (DRISDOL ) 1.25 MG (50000 UNIT) CAPS capsule, TAKE 1 CAPSULE TWICE WEEKLY ON TUESDAY AND FRIDAY, Disp: 24 capsule, Rfl: 3   fluticasone  (FLONASE ) 50 MCG/ACT nasal  spray, Place 1 spray into both nostrils daily., Disp: 16 g, Rfl: 2   Allergies  Allergen Reactions   Amlodipine  Other (See Comments)    Headaches    Farxiga  [Dapagliflozin] Other (See Comments)    headaches     Review of Systems  Constitutional: Negative.   HENT: Negative.    Eyes:  Negative for blurred vision.  Respiratory: Negative.  Negative for shortness of breath.   Cardiovascular: Negative.  Negative for chest pain and palpitations.  Gastrointestinal: Negative.   Endocrine: Negative for polydipsia, polyphagia and polyuria.  Genitourinary: Negative.   Skin: Negative.   Allergic/Immunologic: Negative.   Neurological: Negative.   Hematological: Negative.      Today's Vitals   01/21/23 1544  BP: 110/70  Pulse: 79  Temp: 98.1 F (36.7 C)  SpO2: 98%  Weight: 208 lb (94.3 kg)  Height: 5' 10 (1.778 m)   Body mass index is 29.84 kg/m.  Wt Readings from Last 3 Encounters:  01/21/23 208 lb (94.3 kg)  09/24/22 196 lb 12.8 oz (89.3 kg)  08/06/22 197 lb (89.4 kg)     Objective:  Physical Exam Vitals and nursing note reviewed.  Constitutional:      Appearance: Normal appearance.  HENT:     Head: Normocephalic and atraumatic.  Eyes:     Extraocular Movements: Extraocular movements intact.  Cardiovascular:     Rate and Rhythm: Normal rate and regular rhythm.     Heart sounds: Normal heart sounds.  Pulmonary:     Effort: Pulmonary effort is normal.     Breath sounds: Normal breath sounds.  Musculoskeletal:     Cervical back: Normal range of motion.  Skin:    General: Skin is warm.  Neurological:     General: No focal deficit present.     Mental Status: He is alert.  Psychiatric:        Mood and Affect: Mood normal.         Assessment And Plan:  Type 2 diabetes mellitus with stage 2 chronic kidney disease, without long-term current use of insulin (HCC) Assessment & Plan: Chronic, stable. I will check labs as below. He will continue with metformin  1000mg   twice daily for now. He did have trial of Farxiga , but it caused headaches. He will rto in 3-4 months for re-evaluation.   Orders: -     CMP14+EGFR -     Hemoglobin A1c -     TSH  Hypertensive nephropathy Assessment & Plan: Chronic, well controlled. He will continue with chlorthalidone  25mg  daily and valsartan  160mg  daily. He is reminded to follow a low sodium diet. He will f/u in 4 months for re-evaluation.   Orders: -     CMP14+EGFR  Pure hypercholesterolemia Assessment & Plan: Chronic, LDL goal is less than 70.  He will continue with rosuvastatin  10mg  daily. He is encouraged to follow heart healthy lifestyle.   Orders: -     TSH  Cigarette nicotine dependence in remission Assessment & Plan: He does not qualify for LDCT since he quit smoking cigarettes in 1999. He began smoking cigars in the past  two years.    Drug therapy -     Vitamin B12     Return if symptoms worsen or fail to improve.  Patient was given opportunity to ask questions. Patient verbalized understanding of the plan and was able to repeat key elements of the plan. All questions were answered to their satisfaction.    I, Kelly LOISE Slocumb, MD, have reviewed all documentation for this visit. The documentation on 01/21/23 for the exam, diagnosis, procedures, and orders are all accurate and complete.   IF YOU HAVE BEEN REFERRED TO A SPECIALIST, IT MAY TAKE 1-2 WEEKS TO SCHEDULE/PROCESS THE REFERRAL. IF YOU HAVE NOT HEARD FROM US /SPECIALIST IN TWO WEEKS, PLEASE GIVE US  A CALL AT [phone].   THE PATIENT IS ENCOURAGED TO PRACTICE SOCIAL DISTANCING DUE TO THE COVID-19 PANDEMIC.

## 2023-01-21 NOTE — Patient Instructions (Signed)

## 2023-01-22 LAB — CMP14+EGFR
ALT: 13 [IU]/L (ref 0–44)
AST: 14 [IU]/L (ref 0–40)
Albumin: 4.2 g/dL (ref 3.8–4.8)
Alkaline Phosphatase: 90 [IU]/L (ref 44–121)
BUN/Creatinine Ratio: 12 (ref 10–24)
BUN: 16 mg/dL (ref 8–27)
Bilirubin Total: <0.2 mg/dL (ref 0.0–1.2)
CO2: 27 mmol/L (ref 20–29)
Calcium: 10.4 mg/dL — ABNORMAL HIGH (ref 8.6–10.2)
Chloride: 101 mmol/L (ref 96–106)
Creatinine, Ser: 1.29 mg/dL — ABNORMAL HIGH (ref 0.76–1.27)
Globulin, Total: 3.1 g/dL (ref 1.5–4.5)
Glucose: 167 mg/dL — ABNORMAL HIGH (ref 70–99)
Potassium: 4.3 mmol/L (ref 3.5–5.2)
Sodium: 140 mmol/L (ref 134–144)
Total Protein: 7.3 g/dL (ref 6.0–8.5)
eGFR: 59 mL/min/{1.73_m2} — ABNORMAL LOW (ref >59)

## 2023-01-22 LAB — VITAMIN B12: Vitamin B-12: 860 pg/mL (ref 232–1245)

## 2023-01-22 LAB — TSH: TSH: 2.67 u[IU]/mL (ref 0.450–4.500)

## 2023-01-22 LAB — HEMOGLOBIN A1C
Est. average glucose Bld gHb Est-mCnc: 183 mg/dL
Hgb A1c MFr Bld: 8 % — ABNORMAL HIGH (ref 4.8–5.6)

## 2023-01-27 NOTE — Assessment & Plan Note (Signed)
Chronic, LDL goal is less than 70.  He will continue with rosuvastatin 10mg  daily. He is encouraged to follow heart healthy lifestyle.

## 2023-01-27 NOTE — Assessment & Plan Note (Signed)
Chronic, well controlled. He will continue with chlorthalidone 25mg  daily and valsartan 160mg  daily. He is reminded to follow a low sodium diet. He will f/u in 4 months for re-evaluation.

## 2023-01-27 NOTE — Assessment & Plan Note (Signed)
He does not qualify for LDCT since he quit smoking cigarettes in 1999. He began smoking cigars in the past two years.

## 2023-01-27 NOTE — Assessment & Plan Note (Signed)
Chronic, stable. I will check labs as below. He will continue with metformin 1000mg  twice daily for now. He did have trial of Farxiga, but it caused headaches. He will rto in 3-4 months for re-evaluation.

## 2023-03-21 ENCOUNTER — Other Ambulatory Visit: Payer: Self-pay | Admitting: Internal Medicine

## 2023-03-26 DIAGNOSIS — E1165 Type 2 diabetes mellitus with hyperglycemia: Secondary | ICD-10-CM | POA: Diagnosis not present

## 2023-03-26 DIAGNOSIS — K219 Gastro-esophageal reflux disease without esophagitis: Secondary | ICD-10-CM | POA: Diagnosis not present

## 2023-03-26 DIAGNOSIS — Z1211 Encounter for screening for malignant neoplasm of colon: Secondary | ICD-10-CM | POA: Diagnosis not present

## 2023-03-27 ENCOUNTER — Other Ambulatory Visit: Payer: Self-pay | Admitting: Internal Medicine

## 2023-03-27 NOTE — Telephone Encounter (Signed)
 Copied from CRM [id number]. Topic: Clinical - Medication Refill >> Mar 27, 2023  2:33 PM Patsy Lager T wrote: Most Recent Primary Care Visit:  Provider: Dorothyann Peng  Department: [facility]  Visit Type: OFFICE VISIT  Date: 01/21/2023  Medication: Lancets (ACCU-CHEK MULTICLIX) lancets and ACCU-CHEK GUIDE test strip  Has the patient contacted their pharmacy? No  Is this the correct pharmacy for this prescription? Yes   This is the patient's preferred pharmacy:  [facility] - [place], [place] - [address] [place] [address] Phone: [phone] Fax: [phone]  Has the prescription been filled recently? Yes  Is the patient out of the medication? Yes  Has the patient been seen for an appointment in the last year OR does the patient have an upcoming appointment? Yes  Can we respond through MyChart? No  Agent: Please be advised that Rx refills may take up to 3 business days. We ask that you follow-up with your pharmacy.  Unable to pend medication as it is no longer available for ordering.

## 2023-03-28 MED ORDER — ACCU-CHEK SOFTCLIX LANCETS MISC: 12 refills | Status: AC

## 2023-04-01 ENCOUNTER — Ambulatory Visit: Admitting: Internal Medicine

## 2023-04-01 ENCOUNTER — Encounter: Payer: Self-pay | Admitting: Internal Medicine

## 2023-04-01 VITALS — BP 108/64 | HR 79 | Temp 99.0°F | Ht 70.0 in | Wt 201.4 lb

## 2023-04-01 DIAGNOSIS — E1122 Type 2 diabetes mellitus with diabetic chronic kidney disease: Secondary | ICD-10-CM

## 2023-04-01 DIAGNOSIS — Z8639 Personal history of other endocrine, nutritional and metabolic disease: Secondary | ICD-10-CM | POA: Diagnosis not present

## 2023-04-01 DIAGNOSIS — N182 Chronic kidney disease, stage 2 (mild): Secondary | ICD-10-CM | POA: Diagnosis not present

## 2023-04-01 MED ORDER — DEXCOM G7 SENSOR MISC: 1 refills | Status: DC

## 2023-04-01 NOTE — Patient Instructions (Signed)

## 2023-04-01 NOTE — Progress Notes (Signed)
 I,[name], CMA,acting as a Neurosurgeon for [name], MD.,have documented all relevant documentation on the behalf of [name], MD,as directed by  [name], MD while in the presence of [name], MD.  Subjective:  Patient ID: Kelly Lambert , male    DOB: June 16, 1950 , 73 y.o.   MRN: [id number]  Chief Complaint  Patient presents with   Diabetes    HPI  The patient is here today for a diabetes f/u. He walked in today wanting additional instruction on CGM.  He was given Dexcom G7 to use at his last visit. He is not on insulin. He reports compliance with meds. He denies having any headaches, chest pain and shortness of breath. He states his blood sugars range from 87-130. He also adds h/o low blood sugars less than 70, despite regular appetite.   Diabetes He presents for his follow-up diabetic visit. He has type 2 diabetes mellitus. His disease course has been stable. There are no hypoglycemic associated symptoms. Pertinent negatives for diabetes include no blurred vision, no chest pain, no polydipsia, no polyphagia and no polyuria. There are no hypoglycemic complications. Diabetic complications include nephropathy. Risk factors for coronary artery disease include diabetes mellitus, dyslipidemia, hypertension and male sex. He is compliant with treatment most of the time. He is following a diabetic diet. Meal planning includes avoidance of concentrated sweets. He participates in exercise intermittently. His home blood glucose trend is fluctuating minimally. His breakfast blood glucose is taken between 9-10 am. His breakfast blood glucose range is generally 110-130 mg/dl. An ACE inhibitor/angiotensin II receptor blocker is being taken. Eye exam is current.  Hypertension This is a chronic problem. The current episode started more than 1 year ago. The problem has been gradually improving since onset. The problem is controlled. Pertinent negatives include no blurred vision,  chest pain, palpitations or shortness of breath. Risk factors for coronary artery disease include diabetes mellitus, dyslipidemia and male gender. Past treatments include angiotensin blockers and diuretics. The current treatment provides moderate improvement. Compliance problems include exercise.  Hypertensive end-organ damage includes kidney disease.     Past Medical History:  Diagnosis Date   BPH (benign prostatic hyperplasia)    Diabetes mellitus without complication (HCC)    Hyperlipidemia associated with type 2 diabetes mellitus (HCC)    Hypertension      Family History  Problem Relation Age of Onset   Thyroid cancer Mother    Cancer Father    Cancer Brother      Current Outpatient Medications:    ACCU-CHEK GUIDE test strip, TEST BLOOD SUGAR THREE TIMES DAILY, Disp: 300 strip, Rfl: 2   Accu-Chek Softclix Lancets lancets, Use as instructed, Disp: 100 each, Rfl: 12   aspirin EC 81 MG tablet, Take 81 mg by mouth daily., Disp: , Rfl:    Blood Glucose Calibration (ACCU-CHEK GUIDE CONTROL) LIQD, USE AS DIRECTED, Disp: 1 each, Rfl: 1   chlorthalidone (HYGROTON) 25 MG tablet, TAKE 1 TABLET EVERY DAY, Disp: 90 tablet, Rfl: 3   Continuous Glucose Sensor (DEXCOM G7 SENSOR) MISC, USE AS DIRECTED TO CHECK BLOOD SUGARS., Disp: 2 each, Rfl: 1   gabapentin (NEURONTIN) 100 MG capsule, TAKE 1 CAPSULE AT BEDTIME, Disp: 90 capsule, Rfl: 3   Lancets (ACCU-CHEK MULTICLIX) lancets, Use to check blood sugars 3 times a day. Dx code: e11.65, Disp: 300 each, Rfl: 2   Multiple Vitamin (MULTIVITAMIN) tablet, Take 1 tablet by mouth daily., Disp: , Rfl:    pantoprazole (  PROTONIX) 40 MG tablet, TAKE 1 TABLET EVERY DAY, Disp: 90 tablet, Rfl: 3   rosuvastatin (CRESTOR) 10 MG tablet, TAKE 1 TABLET EVERY DAY, Disp: 90 tablet, Rfl: 3   tamsulosin (FLOMAX) 0.4 MG CAPS capsule, Take 1 capsule (0.4 mg total) by mouth 2 (two) times daily., Disp: 160 capsule, Rfl: 2   valsartan (DIOVAN) 160 MG tablet, TAKE 1 TABLET EVERY  DAY, Disp: 90 tablet, Rfl: 3   Vitamin D, Ergocalciferol, (DRISDOL) 1.25 MG (50000 UNIT) CAPS capsule, TAKE 1 CAPSULE TWICE WEEKLY ON TUESDAY AND FRIDAY, Disp: 24 capsule, Rfl: 3   fluticasone (FLONASE) 50 MCG/ACT nasal spray, Place 1 spray into both nostrils daily., Disp: 16 g, Rfl: 2   metFORMIN (GLUCOPHAGE) 1000 MG tablet, TAKE 1 TABLET TWICE DAILY WITH MEALS, Disp: 180 tablet, Rfl: 3   Allergies  Allergen Reactions   Amlodipine Other (See Comments)    Headaches    Farxiga [Dapagliflozin] Other (See Comments)    headaches     Review of Systems  Constitutional: Negative.   HENT: Negative.    Eyes:  Negative for blurred vision.  Respiratory: Negative.  Negative for shortness of breath.   Cardiovascular: Negative.  Negative for chest pain and palpitations.  Gastrointestinal: Negative.   Endocrine: Negative for polydipsia, polyphagia and polyuria.  Skin: Negative.   Allergic/Immunologic: Negative.   Hematological: Negative.   Psychiatric/Behavioral: Negative.       Today's Vitals   04/01/23 1451  BP: 108/64  Pulse: 79  Temp: 99 F (37.2 C)  SpO2: 98%  Weight: 201 lb 6.4 oz (91.4 kg)  Height: 5\' 10"  (1.778 m)   Body mass index is 28.9 kg/m.  Wt Readings from Last 3 Encounters:  04/01/23 201 lb 6.4 oz (91.4 kg)  01/21/23 208 lb (94.3 kg)  09/24/22 196 lb 12.8 oz (89.3 kg)     Objective:  Physical Exam Vitals and nursing note reviewed.  Constitutional:      Appearance: Normal appearance.  HENT:     Head: Normocephalic and atraumatic.  Eyes:     Extraocular Movements: Extraocular movements intact.  Cardiovascular:     Rate and Rhythm: Normal rate and regular rhythm.     Heart sounds: Normal heart sounds.  Pulmonary:     Effort: Pulmonary effort is normal.     Breath sounds: Normal breath sounds.  Musculoskeletal:     Cervical back: Normal range of motion.  Skin:    General: Skin is warm.  Neurological:     General: No focal deficit present.     Mental  Status: He is alert.  Psychiatric:        Mood and Affect: Mood normal.         Assessment And Plan:  Type 2 diabetes mellitus with stage 2 chronic kidney disease, without long-term current use of insulin (HCC) Assessment & Plan: He was given Dexcom G7 teaching again. I think he would benefit from CGM use due to his h/o hypoglycemia. Especially, since he had no sx during his episodes. He was given sample of a G7 sensor.   Orders: -     Dexcom G7 Sensor; USE AS DIRECTED TO CHECK BLOOD SUGARS.  Dispense: 2 each; Refill: 1  History of hypoglycemia Assessment & Plan: He would greatly benefit from CGM use to better monitor his blood sugars. He was also advised about Stelo, OTC CGM. He is encouraged to check on pricing for this.       Return if symptoms worsen or fail  to improve.  Patient was given opportunity to ask questions. Patient verbalized understanding of the plan and was able to repeat key elements of the plan. All questions were answered to their satisfaction.    I, [name], MD, have reviewed all documentation for this visit. The documentation on 04/01/23 for the exam, diagnosis, procedures, and orders are all accurate and complete.   IF YOU HAVE BEEN REFERRED TO A SPECIALIST, IT MAY TAKE 1-2 WEEKS TO SCHEDULE/PROCESS THE REFERRAL. IF YOU HAVE NOT HEARD FROM US/SPECIALIST IN TWO WEEKS, PLEASE GIVE [place] A CALL AT [phone].   THE PATIENT IS ENCOURAGED TO PRACTICE SOCIAL DISTANCING DUE TO THE COVID-19 PANDEMIC.

## 2023-04-02 ENCOUNTER — Other Ambulatory Visit: Payer: Self-pay | Admitting: Internal Medicine

## 2023-04-06 DIAGNOSIS — Z8639 Personal history of other endocrine, nutritional and metabolic disease: Secondary | ICD-10-CM | POA: Insufficient documentation

## 2023-04-06 NOTE — Assessment & Plan Note (Signed)
 He would greatly benefit from CGM use to better monitor his blood sugars. He was also advised about Stelo, OTC CGM. He is encouraged to check on pricing for this.

## 2023-04-06 NOTE — Assessment & Plan Note (Signed)
 He was given Dexcom G7 teaching again. I think he would benefit from CGM use due to his h/o hypoglycemia. Especially, since he had no sx during his episodes. He was given sample of a G7 sensor.

## 2023-04-08 ENCOUNTER — Other Ambulatory Visit: Payer: Self-pay | Admitting: Internal Medicine

## 2023-04-08 ENCOUNTER — Other Ambulatory Visit: Payer: Self-pay

## 2023-04-08 ENCOUNTER — Encounter: Payer: Self-pay | Admitting: Internal Medicine

## 2023-04-09 ENCOUNTER — Other Ambulatory Visit: Payer: Self-pay

## 2023-04-09 DIAGNOSIS — E1122 Type 2 diabetes mellitus with diabetic chronic kidney disease: Secondary | ICD-10-CM

## 2023-04-09 MED ORDER — DEXCOM G7 SENSOR MISC: 1 refills | Status: AC

## 2023-05-06 DIAGNOSIS — Z1211 Encounter for screening for malignant neoplasm of colon: Secondary | ICD-10-CM | POA: Diagnosis not present

## 2023-05-06 DIAGNOSIS — Z860101 Personal history of adenomatous and serrated colon polyps: Secondary | ICD-10-CM | POA: Diagnosis not present

## 2023-05-06 LAB — HM COLONOSCOPY

## 2023-05-28 ENCOUNTER — Encounter: Payer: Medicare HMO | Admitting: Internal Medicine

## 2023-06-03 ENCOUNTER — Ambulatory Visit: Payer: Self-pay | Admitting: Internal Medicine

## 2023-06-03 ENCOUNTER — Ambulatory Visit (INDEPENDENT_AMBULATORY_CARE_PROVIDER_SITE_OTHER): Payer: Medicare HMO | Admitting: Internal Medicine

## 2023-06-03 ENCOUNTER — Encounter: Payer: Self-pay | Admitting: Internal Medicine

## 2023-06-03 VITALS — BP 108/72 | HR 73 | Temp 97.9°F | Ht 70.0 in | Wt 199.6 lb

## 2023-06-03 DIAGNOSIS — E7800 Pure hypercholesterolemia, unspecified: Secondary | ICD-10-CM

## 2023-06-03 DIAGNOSIS — Z0000 Encounter for general adult medical examination without abnormal findings: Secondary | ICD-10-CM | POA: Diagnosis not present

## 2023-06-03 DIAGNOSIS — I129 Hypertensive chronic kidney disease with stage 1 through stage 4 chronic kidney disease, or unspecified chronic kidney disease: Secondary | ICD-10-CM

## 2023-06-03 DIAGNOSIS — N182 Chronic kidney disease, stage 2 (mild): Secondary | ICD-10-CM

## 2023-06-03 DIAGNOSIS — E1122 Type 2 diabetes mellitus with diabetic chronic kidney disease: Secondary | ICD-10-CM | POA: Diagnosis not present

## 2023-06-03 DIAGNOSIS — I7121 Aneurysm of the ascending aorta, without rupture: Secondary | ICD-10-CM | POA: Diagnosis not present

## 2023-06-03 DIAGNOSIS — N1831 Chronic kidney disease, stage 3a: Secondary | ICD-10-CM

## 2023-06-03 DIAGNOSIS — E1141 Type 2 diabetes mellitus with diabetic mononeuropathy: Secondary | ICD-10-CM | POA: Diagnosis not present

## 2023-06-03 LAB — POCT URINALYSIS DIPSTICK
Bilirubin, UA: NEGATIVE
Blood, UA: NEGATIVE
Glucose, UA: POSITIVE — AB
Ketones, UA: NEGATIVE
Leukocytes, UA: NEGATIVE
Nitrite, UA: NEGATIVE
Protein, UA: NEGATIVE
Spec Grav, UA: 1.015 (ref 1.010–1.025)
Urobilinogen, UA: 0.2 U/dL
pH, UA: 5.5 (ref 5.0–8.0)

## 2023-06-03 NOTE — Assessment & Plan Note (Addendum)
 Chronic, diabetic foot exam was performed.  Previous A1c of 8 indicates suboptimal control. Morning glucose levels are stable, but postprandial levels and dietary habits suggest poor dietary adherence. - Obtain A1c to guide management. - Advise dietary modifications to reduce high-sugar intake. - Continue with metformin  1000mg  twice daily - Previously intolerant of SGLT2i - Consider use of Rybelsus 3mg  daily

## 2023-06-03 NOTE — Assessment & Plan Note (Signed)
 Unchanged, most recent CT angio reviewed in detail. This was last performed July 2024, he is due for repeat testing July 2025. - He is encouraged to keep BP well controlled.

## 2023-06-03 NOTE — Assessment & Plan Note (Signed)
 A full exam was performed.  DRE deferred, per patient requeat.  He is also followed by Urology.  He is advised to get 30-45 minutes of regular exercise, no less than four to five days per week. Both weight-bearing and aerobic exercises are recommended.  He is advised to follow a healthy diet with at least six fruits/veggies per day, decrease intake of red meat and other saturated fats and to increase fish intake to twice weekly.  Meats/fish should not be fried -- baked, boiled or broiled is preferable. It is also important to cut back on your sugar intake.  Be sure to read labels - try to avoid anything with added sugar, high fructose corn syrup or other sweeteners.  If you must use a sweetener, you can try stevia or monkfruit.  It is also important to avoid artificially sweetened foods/beverages and diet drinks. Lastly, wear SPF 50 sunscreen on exposed skin and when in direct sunlight for an extended period of time.  Be sure to avoid fast food restaurants and aim for at least 60 ounces of water daily.

## 2023-06-03 NOTE — Assessment & Plan Note (Addendum)
 Chronic, well controlled.  EKG performed, NSR w/ occasional ectopic ventricular beat, left axis.   - Continue with chlorthalidone  25mg  daily - Continue with valsartan  160mg  daily - Follow low sodium diet - Follow up in four months

## 2023-06-03 NOTE — Patient Instructions (Signed)
 Health Maintenance, Male  Adopting a healthy lifestyle and getting preventive care are important in promoting health and wellness. Ask your health care provider about:  The right schedule for you to have regular tests and exams.  Things you can do on your own to prevent diseases and keep yourself healthy.  What should I know about diet, weight, and exercise?  Eat a healthy diet    Eat a diet that includes plenty of vegetables, fruits, low-fat dairy products, and lean protein.  Do not eat a lot of foods that are high in solid fats, added sugars, or sodium.  Maintain a healthy weight  Body mass index (BMI) is a measurement that can be used to identify possible weight problems. It estimates body fat based on height and weight. Your health care provider can help determine your BMI and help you achieve or maintain a healthy weight.  Get regular exercise  Get regular exercise. This is one of the most important things you can do for your health. Most adults should:  Exercise for at least 150 minutes each week. The exercise should increase your heart rate and make you sweat (moderate-intensity exercise).  Do strengthening exercises at least twice a week. This is in addition to the moderate-intensity exercise.  Spend less time sitting. Even light physical activity can be beneficial.  Watch cholesterol and blood lipids  Have your blood tested for lipids and cholesterol at 72 years of age, then have this test every 5 years.  You may need to have your cholesterol levels checked more often if:  Your lipid or cholesterol levels are high.  You are older than 72 years of age.  You are at high risk for heart disease.  What should I know about cancer screening?  Many types of cancers can be detected early and may often be prevented. Depending on your health history and family history, you may need to have cancer screening at various ages. This may include screening for:  Colorectal cancer.  Prostate cancer.  Skin cancer.  Lung  cancer.  What should I know about heart disease, diabetes, and high blood pressure?  Blood pressure and heart disease  High blood pressure causes heart disease and increases the risk of stroke. This is more likely to develop in people who have high blood pressure readings or are overweight.  Talk with your health care provider about your target blood pressure readings.  Have your blood pressure checked:  Every 3-5 years if you are 9-95 years of age.  Every year if you are 85 years old or older.  If you are between the ages of 29 and 29 and are a current or former smoker, ask your health care provider if you should have a one-time screening for abdominal aortic aneurysm (AAA).  Diabetes  Have regular diabetes screenings. This checks your fasting blood sugar level. Have the screening done:  Once every three years after age 23 if you are at a normal weight and have a low risk for diabetes.  More often and at a younger age if you are overweight or have a high risk for diabetes.  What should I know about preventing infection?  Hepatitis B  If you have a higher risk for hepatitis B, you should be screened for this virus. Talk with your health care provider to find out if you are at risk for hepatitis B infection.  Hepatitis C  Blood testing is recommended for:  Everyone born from [birth year] through 1964.  Anyone  with known risk factors for hepatitis C.  Sexually transmitted infections (STIs)  You should be screened each year for STIs, including gonorrhea and chlamydia, if:  You are sexually active and are younger than 72 years of age.  You are older than 72 years of age and your health care provider tells you that you are at risk for this type of infection.  Your sexual activity has changed since you were last screened, and you are at increased risk for chlamydia or gonorrhea. Ask your health care provider if you are at risk.  Ask your health care provider about whether you are at high risk for HIV. Your health care provider  may recommend a prescription medicine to help prevent HIV infection. If you choose to take medicine to prevent HIV, you should first get tested for HIV. You should then be tested every 3 months for as long as you are taking the medicine.  Follow these instructions at home:  Alcohol use  Do not drink alcohol if your health care provider tells you not to drink.  If you drink alcohol:  Limit how much you have to 0-2 drinks a day.  Know how much alcohol is in your drink. In the [place] one drink equals one 12 oz bottle of beer (355 mL), one 5 oz glass of wine (148 mL), or one 1 oz glass of hard liquor (44 mL).  Lifestyle  Do not use any products that contain nicotine or tobacco. These products include cigarettes, chewing tobacco, and vaping devices, such as e-cigarettes. If you need help quitting, ask your health care provider.  Do not use street drugs.  Do not share needles.  Ask your health care provider for help if you need support or information about quitting drugs.  General instructions  Schedule regular health, dental, and eye exams.  Stay current with your vaccines.  Tell your health care provider if:  You often feel depressed.  You have ever been abused or do not feel safe at home.  Summary  Adopting a healthy lifestyle and getting preventive care are important in promoting health and wellness.  Follow your health care provider's instructions about healthy diet, exercising, and getting tested or screened for diseases.  Follow your health care provider's instructions on monitoring your cholesterol and blood pressure.  This information is not intended to replace advice given to you by your health care provider. Make sure you discuss any questions you have with your health care provider.  Document Revised: 05/15/2020 Document Reviewed: 05/15/2020  Elsevier Patient Education  2024 [employer].

## 2023-06-03 NOTE — Progress Notes (Signed)
 I,Victoria T Basil Lim, CMA,acting as a Neurosurgeon for Smiley Dung, MD.,have documented all relevant documentation on the behalf of Smiley Dung, MD,as directed by  Smiley Dung, MD while in the presence of Smiley Dung, MD.  Subjective:   Patient ID: Kelly Lambert  , male    DOB: [birth year] , 73 y.o.   MRN: [id number]  Chief Complaint  Patient presents with   Annual Exam    Patient presents for annual exam. Patient doesn't have any specific questions or concerns. Denies headaches, chest pain and sob. He completes prostate exams with Urology.  He complains of neck pain this has been an ongoing issue since December. He is not currently establish with orthopedic.    Diabetes   Hypertension    HPI Discussed the use of AI scribe software for clinical note transcription with the patient, who gave verbal consent to proceed.  History of Present Illness Kelly Lambert  is a 73 year old male with diabetes who presents for a physical and diabetes check.  Morning blood sugar levels are around 101 mg/dL, and post-dinner levels are approximately 140 mg/dL. His last recorded A1c was 8%. He acknowledges consuming sweets such as chocolate chip cookies and pound cake, which may have contributed to this level. Energy levels are described as 'not too bad', and he feels okay for his age.  He is currently taking valsartan  160 mg daily, rosuvastatin  10 mg, pantoprazole  40 mg, metformin  twice daily, gabapentin  at night, aspirin, and chlorthalidone . No heartburn is experienced.  He reports a dry throat that started in December and drinks about four 16-ounce bottles of water daily. He occasionally consumes Pepsi with meals like spaghetti or pizza and has had chocolate milk recently.   He has not yet completed a CT of the chest that was ordered a year ago, as he decided to wait until after the holidays.   Diabetes He presents for his follow-up diabetic visit. He has type 2 diabetes mellitus. His  disease course has been stable. There are no hypoglycemic associated symptoms. Pertinent negatives for diabetes include no blurred vision, no chest pain, no polydipsia, no polyphagia and no polyuria. There are no hypoglycemic complications. Diabetic complications include nephropathy. Risk factors for coronary artery disease include diabetes mellitus, dyslipidemia, hypertension and male sex. He is compliant with treatment most of the time. He is following a diabetic diet. Meal planning includes avoidance of concentrated sweets. He participates in exercise intermittently. His home blood glucose trend is fluctuating minimally. His breakfast blood glucose is taken between 9-10 am. His breakfast blood glucose range is generally 110-130 mg/dl. An ACE inhibitor/angiotensin II receptor blocker is being taken. Eye exam is current.  Hypertension This is a chronic problem. The current episode started more than 1 year ago. The problem has been gradually improving since onset. The problem is controlled. Pertinent negatives include no blurred vision, chest pain, palpitations or shortness of breath. Risk factors for coronary artery disease include diabetes mellitus, dyslipidemia and male gender. Past treatments include angiotensin blockers and diuretics. The current treatment provides moderate improvement. Compliance problems include exercise.  Hypertensive end-organ damage includes kidney disease.     Past Medical History:  Diagnosis Date   BPH (benign prostatic hyperplasia)    Diabetes mellitus without complication (HCC)    Hyperlipidemia associated with type 2 diabetes mellitus (HCC)    Hypertension      Family History  Problem Relation Age of Onset   Thyroid  cancer Mother    Cancer Father  Cancer Brother      Current Outpatient Medications:    ACCU-CHEK GUIDE test strip, TEST BLOOD SUGAR THREE TIMES DAILY, Disp: 300 strip, Rfl: 2   Accu-Chek Softclix Lancets lancets, Use as instructed, Disp: 100 each,  Rfl: 12   aspirin EC 81 MG tablet, Take 81 mg by mouth daily., Disp: , Rfl:    Blood Glucose Calibration (ACCU-CHEK GUIDE CONTROL) LIQD, USE AS DIRECTED, Disp: 1 each, Rfl: 1   chlorthalidone  (HYGROTON ) 25 MG tablet, TAKE 1 TABLET EVERY DAY, Disp: 90 tablet, Rfl: 3   Continuous Glucose Sensor (DEXCOM G7 SENSOR) MISC, USE AS DIRECTED TO CHECK BLOOD SUGARS., Disp: 2 each, Rfl: 1   gabapentin  (NEURONTIN ) 100 MG capsule, TAKE 1 CAPSULE AT BEDTIME, Disp: 90 capsule, Rfl: 3   Lancets (ACCU-CHEK MULTICLIX) lancets, Use to check blood sugars 3 times a day. Dx code: e11.65, Disp: 300 each, Rfl: 2   metFORMIN  (GLUCOPHAGE ) 1000 MG tablet, TAKE 1 TABLET TWICE DAILY WITH MEALS, Disp: 180 tablet, Rfl: 3   Multiple Vitamin (MULTIVITAMIN) tablet, Take 1 tablet by mouth daily., Disp: , Rfl:    pantoprazole  (PROTONIX ) 40 MG tablet, TAKE 1 TABLET EVERY DAY, Disp: 90 tablet, Rfl: 3   rosuvastatin  (CRESTOR ) 10 MG tablet, TAKE 1 TABLET EVERY DAY, Disp: 90 tablet, Rfl: 3   tamsulosin  (FLOMAX ) 0.4 MG CAPS capsule, Take 1 capsule (0.4 mg total) by mouth 2 (two) times daily., Disp: 160 capsule, Rfl: 2   valsartan  (DIOVAN ) 160 MG tablet, TAKE 1 TABLET EVERY DAY, Disp: 90 tablet, Rfl: 3   Vitamin D , Ergocalciferol , (DRISDOL ) 1.25 MG (50000 UNIT) CAPS capsule, TAKE 1 CAPSULE TWICE WEEKLY ON TUESDAY AND FRIDAY, Disp: 24 capsule, Rfl: 3   fluticasone  (FLONASE ) 50 MCG/ACT nasal spray, Place 1 spray into both nostrils daily., Disp: 16 g, Rfl: 2   Allergies  Allergen Reactions   Amlodipine  Other (See Comments)    Headaches    Farxiga  [Dapagliflozin] Other (See Comments)    headaches     Men's preventive visit. Patient Health Questionnaire (PHQ-2) is  Flowsheet Row Office Visit from 06/03/2023 in [facility]  PHQ-2 Total Score 0     . Patient is on a diabetic diet. Marital status: Married. Relevant history for alcohol use is:  Social History   Substance and Sexual Activity  Alcohol  Use Not Currently  . Relevant history for tobacco use is:  Social History   Tobacco Use  Smoking Status Former   Current packs/day: 0.00   Average packs/day: 1 pack/day for 25.0 years (25.0 ttl pk-yrs)   Types: Cigarettes, Cigars   Start date: 33   Quit date: 1999   Years since quitting: 26.4  Smokeless Tobacco Former   Types: Chew   Quit date: 2018  Tobacco Comments   Quit cigarettes in 1999. He started to smoke cigars 2 years ago. 2 cigs/daily.   .   Review of Systems  Constitutional: Negative.   HENT: Negative.    Eyes: Negative.  Negative for blurred vision.  Respiratory: Negative.  Negative for shortness of breath.   Cardiovascular:  Negative for chest pain and palpitations.  Gastrointestinal: Negative.   Endocrine: Negative.  Negative for polydipsia, polyphagia and polyuria.  Genitourinary: Negative.   Musculoskeletal: Negative.   Skin: Negative.   Allergic/Immunologic: Negative.   Neurological: Negative.   Hematological: Negative.      Today's Vitals   06/03/23 1454  BP: 108/72  Pulse: 73  Temp: 97.9 F (36.6 C)  SpO2: 98%  Weight: 199 lb 9.6 oz (90.5 kg)  Height: 5\' 10"  (1.778 m)   Body mass index is 28.64 kg/m.  Wt Readings from Last 3 Encounters:  06/03/23 199 lb 9.6 oz (90.5 kg)  04/01/23 201 lb 6.4 oz (91.4 kg)  01/21/23 208 lb (94.3 kg)    Objective:  Physical Exam Vitals and nursing note reviewed.  Constitutional:      Appearance: Normal appearance.  HENT:     Head: Normocephalic and atraumatic.     Right Ear: Tympanic membrane, ear canal and external ear normal.     Left Ear: Tympanic membrane, ear canal and external ear normal.     Nose: Nose normal.     Mouth/Throat:     Mouth: Mucous membranes are moist.     Pharynx: Oropharynx is clear.  Eyes:     Extraocular Movements: Extraocular movements intact.     Conjunctiva/sclera: Conjunctivae normal.     Pupils: Pupils are equal, round, and reactive to light.  Cardiovascular:      Rate and Rhythm: Normal rate and regular rhythm.     Pulses: Normal pulses.          Dorsalis pedis pulses are 2+ on the right side and 2+ on the left side.     Heart sounds: Normal heart sounds.  Pulmonary:     Effort: Pulmonary effort is normal.     Breath sounds: Normal breath sounds.  Abdominal:     General: Abdomen is flat. Bowel sounds are normal.     Palpations: Abdomen is soft.  Genitourinary:    Comments: deferred Musculoskeletal:        General: Normal range of motion.     Cervical back: Normal range of motion and neck supple.  Feet:     Right foot:     Protective Sensation: 5 sites tested.  5 sites sensed.     Skin integrity: Dry skin present.     Toenail Condition: Right toenails are long.     Left foot:     Protective Sensation: 5 sites tested.  5 sites sensed.     Skin integrity: Dry skin present.     Toenail Condition: Left toenails are long.  Skin:    General: Skin is warm and dry.  Neurological:     General: No focal deficit present.     Mental Status: He is alert and oriented to person, place, and time.  Psychiatric:        Mood and Affect: Mood normal.        Behavior: Behavior normal.         Assessment And Plan:    Routine general medical examination at a health care facility Assessment & Plan: A full exam was performed.  DRE deferred, per patient requeat.  He is also followed by Urology.  He is advised to get 30-45 minutes of regular exercise, no less than four to five days per week. Both weight-bearing and aerobic exercises are recommended.  He is advised to follow a healthy diet with at least six fruits/veggies per day, decrease intake of red meat and other saturated fats and to increase fish intake to twice weekly.  Meats/fish should not be fried -- baked, boiled or broiled is preferable. It is also important to cut back on your sugar intake.  Be sure to read labels - try to avoid anything with added sugar, high fructose corn syrup or other sweeteners.   If you must use a sweetener,  you can try stevia or monkfruit.  It is also important to avoid artificially sweetened foods/beverages and diet drinks. Lastly, wear SPF 50 sunscreen on exposed skin and when in direct sunlight for an extended period of time.  Be sure to avoid fast food restaurants and aim for at least 60 ounces of water daily.       Type 2 diabetes mellitus with stage 2 chronic kidney disease, without long-term current use of insulin (HCC) Assessment & Plan: Chronic, diabetic foot exam was performed.  Previous A1c of 8 indicates suboptimal control. Morning glucose levels are stable, but postprandial levels and dietary habits suggest poor dietary adherence. - Obtain A1c to guide management. - Advise dietary modifications to reduce high-sugar intake. - Continue with metformin  1000mg  twice daily - Previously intolerant of SGLT2i - Consider use of Rybelsus 3mg  daily  Orders: -     POCT urinalysis dipstick -     Microalbumin / creatinine urine ratio -     EKG 12-Lead -     CMP14+EGFR -     Lipid panel -     Hemoglobin A1c -     CBC  Diabetic mononeuropathy associated with type 2 diabetes mellitus (HCC) Assessment & Plan: Chronic, sx controlled with nightly gabapentin .  - continue gabapentin    Hypertensive nephropathy Assessment & Plan: Chronic, well controlled.  EKG performed, NSR w/ occasional ectopic ventricular beat, left axis.   - Continue with chlorthalidone  25mg  daily - Continue with valsartan  160mg  daily - Follow low sodium diet - Follow up in four months   Orders: -     POCT urinalysis dipstick -     Microalbumin / creatinine urine ratio -     EKG 12-Lead  Pure hypercholesterolemia Assessment & Plan: Chronic, LDL goal is less than 70.  He will continue with rosuvastatin  10mg  daily. He is encouraged to follow heart healthy lifestyle.    Aneurysm of ascending aorta without rupture [facility]) Assessment & Plan: Unchanged, most recent CT angio reviewed in  detail. This was last performed July 2024, he is due for repeat testing July 2025. - He is encouraged to keep BP well controlled.   Orders: -     CT ANGIO CHEST AORTA W/CM & OR WO/CM; Future   Return for 1 YEAR HM, 4 MONTH DM F/U. Patient was given opportunity to ask questions. Patient verbalized understanding of the plan and was able to repeat key elements of the plan. All questions were answered to their satisfaction.   I, Smiley Dung, MD, have reviewed all documentation for this visit. The documentation on 06/03/23 for the exam, diagnosis, procedures, and orders are all accurate and complete.

## 2023-06-03 NOTE — Assessment & Plan Note (Signed)
 Chronic, sx controlled with nightly gabapentin .  - continue gabapentin 

## 2023-06-03 NOTE — Assessment & Plan Note (Signed)
Chronic, LDL goal is less than 70.  He will continue with rosuvastatin 10mg  daily. He is encouraged to follow heart healthy lifestyle.

## 2023-06-04 LAB — CMP14+EGFR
ALT: 13 IU/L (ref 0–44)
AST: 15 IU/L (ref 0–40)
Albumin: 4.4 g/dL (ref 3.8–4.8)
Alkaline Phosphatase: 87 IU/L (ref 44–121)
BUN/Creatinine Ratio: 12 (ref 10–24)
BUN: 16 mg/dL (ref 8–27)
Bilirubin Total: <0.2 mg/dL (ref 0.0–1.2)
CO2: 25 mmol/L (ref 20–29)
Calcium: 10.5 mg/dL — ABNORMAL HIGH (ref 8.6–10.2)
Chloride: 104 mmol/L (ref 96–106)
Creatinine, Ser: 1.33 mg/dL — ABNORMAL HIGH (ref 0.76–1.27)
Globulin, Total: 3.1 g/dL (ref 1.5–4.5)
Glucose: 120 mg/dL — ABNORMAL HIGH (ref 70–99)
Potassium: 4.1 mmol/L (ref 3.5–5.2)
Sodium: 142 mmol/L (ref 134–144)
Total Protein: 7.5 g/dL (ref 6.0–8.5)
eGFR: 57 mL/min/{1.73_m2} — ABNORMAL LOW (ref >59)

## 2023-06-04 LAB — CBC
Hematocrit: 39.5 % (ref 37.5–51.0)
Hemoglobin: 13.1 g/dL (ref 13.0–17.7)
MCH: 31.2 pg (ref 26.6–33.0)
MCHC: 33.2 g/dL (ref 31.5–35.7)
MCV: 94 fL (ref 79–97)
Platelets: 195 10*3/uL (ref 150–450)
RBC: 4.2 x10E6/uL (ref 4.14–5.80)
RDW: 14.2 % (ref 11.6–15.4)
WBC: 4.8 10*3/uL (ref 3.4–10.8)

## 2023-06-04 LAB — MICROALBUMIN / CREATININE URINE RATIO
Creatinine, Urine: 73.9 mg/dL
Microalb/Creat Ratio: <4 mg/g{creat} (ref 0–29)
Microalbumin, Urine: <3 ug/mL

## 2023-06-04 LAB — HEMOGLOBIN A1C
Est. average glucose Bld gHb Est-mCnc: 160 mg/dL
Hgb A1c MFr Bld: 7.2 % — ABNORMAL HIGH (ref 4.8–5.6)

## 2023-06-04 LAB — LIPID PANEL
Chol/HDL Ratio: 3.3 ratio (ref 0.0–5.0)
Cholesterol, Total: 130 mg/dL (ref 100–199)
HDL: 40 mg/dL (ref >39)
LDL Chol Calc (NIH): 61 mg/dL (ref 0–99)
Triglycerides: 175 mg/dL — ABNORMAL HIGH (ref 0–149)
VLDL Cholesterol Cal: 29 mg/dL (ref 5–40)

## 2023-06-05 ENCOUNTER — Encounter: Payer: Self-pay | Admitting: Internal Medicine

## 2023-06-06 ENCOUNTER — Ambulatory Visit
Admission: RE | Admit: 2023-06-06 | Discharge: 2023-06-06 | Disposition: A | Discharge status: Home / Self Care | Source: Ambulatory Visit | Attending: Internal Medicine | Admitting: Internal Medicine

## 2023-06-06 DIAGNOSIS — N1831 Chronic kidney disease, stage 3a: Secondary | ICD-10-CM

## 2023-06-06 DIAGNOSIS — N183 Chronic kidney disease, stage 3 unspecified: Secondary | ICD-10-CM | POA: Diagnosis not present

## 2023-06-16 ENCOUNTER — Ambulatory Visit: Payer: Self-pay | Admitting: Internal Medicine

## 2023-06-20 ENCOUNTER — Ambulatory Visit
Admission: RE | Admit: 2023-06-20 | Discharge: 2023-06-20 | Disposition: A | Discharge status: Home / Self Care | Source: Ambulatory Visit | Attending: Internal Medicine | Admitting: Internal Medicine

## 2023-06-20 DIAGNOSIS — I7121 Aneurysm of the ascending aorta, without rupture: Secondary | ICD-10-CM | POA: Diagnosis not present

## 2023-06-20 MED ORDER — IOPAMIDOL (ISOVUE-M 300) INJECTION 61%
15.0000 mL | Freq: Once | INTRAMUSCULAR | Status: AC | PRN
  Administered 2023-06-20: 15 mL via INTRATHECAL

## 2023-08-27 ENCOUNTER — Other Ambulatory Visit: Payer: Self-pay | Admitting: Internal Medicine

## 2023-09-20 ENCOUNTER — Other Ambulatory Visit: Payer: Self-pay | Admitting: Internal Medicine

## 2023-10-07 ENCOUNTER — Ambulatory Visit: Admitting: Internal Medicine

## 2023-10-07 ENCOUNTER — Encounter: Payer: Self-pay | Admitting: Internal Medicine

## 2023-10-07 VITALS — BP 110/68 | Temp 98.4°F | Ht 70.0 in | Wt 196.0 lb

## 2023-10-07 DIAGNOSIS — R202 Paresthesia of skin: Secondary | ICD-10-CM | POA: Diagnosis not present

## 2023-10-07 DIAGNOSIS — Z23 Encounter for immunization: Secondary | ICD-10-CM

## 2023-10-07 DIAGNOSIS — M25532 Pain in left wrist: Secondary | ICD-10-CM | POA: Diagnosis not present

## 2023-10-07 DIAGNOSIS — M542 Cervicalgia: Secondary | ICD-10-CM | POA: Diagnosis not present

## 2023-10-07 DIAGNOSIS — I129 Hypertensive chronic kidney disease with stage 1 through stage 4 chronic kidney disease, or unspecified chronic kidney disease: Secondary | ICD-10-CM

## 2023-10-07 DIAGNOSIS — N1831 Chronic kidney disease, stage 3a: Secondary | ICD-10-CM | POA: Diagnosis not present

## 2023-10-07 DIAGNOSIS — E7800 Pure hypercholesterolemia, unspecified: Secondary | ICD-10-CM | POA: Diagnosis not present

## 2023-10-07 DIAGNOSIS — E1122 Type 2 diabetes mellitus with diabetic chronic kidney disease: Secondary | ICD-10-CM | POA: Diagnosis not present

## 2023-10-07 DIAGNOSIS — N182 Chronic kidney disease, stage 2 (mild): Secondary | ICD-10-CM | POA: Diagnosis not present

## 2023-10-07 DIAGNOSIS — M25531 Pain in right wrist: Secondary | ICD-10-CM | POA: Insufficient documentation

## 2023-10-07 NOTE — Assessment & Plan Note (Signed)
 Chronic.  Blood sugars suboptimal, morning levels 130-140 mg/dL. Hypertension managed with valsartan  and chlorthalidone . - Continue metformin  for diabetes management. - Continue valsartan  160 mg for blood pressure control. - Continue chlorthalidone  for blood pressure control. - Obtain eye exam and ensure results are sent to provider.

## 2023-10-07 NOTE — Patient Instructions (Signed)

## 2023-10-07 NOTE — Assessment & Plan Note (Signed)
Chronic, LDL goal is less than 70.  He will continue with rosuvastatin 10mg  daily. He is encouraged to follow heart healthy lifestyle.

## 2023-10-07 NOTE — Progress Notes (Signed)
 I,[name], CMA,acting as a Neurosurgeon for [name], MD.,have documented all relevant documentation on the behalf of [name], MD,as directed by  [name], MD while in the presence of [name], MD.  Subjective:  Patient ID: Kelly Lambert  , male    DOB: Nov 15, 1950 , 73 y.o.   MRN: [id number]  Chief Complaint  Patient presents with   Diabetes    Patient presents today dm, bp & cholesterol follow up. He reports compliance with medications.  He complains of neck pain on the right side. This has been a constant on/off issue.    Hyperlipidemia   Hypertension    HPI Discussed the use of AI scribe software for clinical note transcription with the patient, who gave verbal consent to proceed.  History of Present Illness Kelly Lambert  is a 73 year old male who presents for a routine check-up.  His blood sugar levels in the morning range from 130 to 140 mg/dL. Current medications include chlorthalidone , Flonase  as needed, gabapentin  at night, metformin , Protonix , rosuvastatin  10 mg, Flomax  twice daily, valsartan  160 mg, and aspirin daily.  He has been experiencing neck pain since November, described as a cracking sensation when turning his neck. There is no tingling in the hands, but occasional numbness is noted. He has a history of bilateral wrist fractures from 1994, which sometimes cause aching pain. Hand surgery was recommended for a 65% improvement, but he did not undergo it.  He experiences a dry throat at night and has tried using a humidifier without relief. No mouth breathing and does not use a CPAP. He wakes up three times a night to urinate, attributed to high water intake. No headaches or snoring unless tired.  He received a flu shot and continues to see a urologist annually, with the last visit in January. Reflux symptoms have been well-controlled with medication, with no symptoms in years. He plans to have an eye exam at the [facility] with  Dr. Joshua at [facility]   Diabetes He presents for his follow-up diabetic visit. He has type 2 diabetes mellitus. His disease course has been stable. There are no hypoglycemic associated symptoms. Pertinent negatives for diabetes include no blurred vision, no chest pain, no polydipsia, no polyphagia and no polyuria. There are no hypoglycemic complications. Diabetic complications include nephropathy. Risk factors for coronary artery disease include diabetes mellitus, dyslipidemia, hypertension and male sex. He is compliant with treatment most of the time. He is following a diabetic diet. Meal planning includes avoidance of concentrated sweets. He participates in exercise intermittently. His home blood glucose trend is fluctuating minimally. His breakfast blood glucose is taken between 9-10 am. His breakfast blood glucose range is generally 110-130 mg/dl. An ACE inhibitor/angiotensin II receptor blocker is being taken. Eye exam is current.  Hyperlipidemia This is a chronic problem. The current episode started more than 1 year ago. The problem is controlled. Exacerbating diseases include diabetes. He has no history of obesity. Pertinent negatives include no chest pain or shortness of breath. Current antihyperlipidemic treatment includes statins. The current treatment provides moderate improvement of lipids. Risk factors for coronary artery disease include diabetes mellitus, dyslipidemia, hypertension and male sex.  Hypertension This is a chronic problem. The current episode started more than 1 year ago. The problem has been gradually improving since onset. The problem is controlled. Pertinent negatives include no blurred vision, chest pain, palpitations or shortness of breath. Risk factors for coronary artery disease include diabetes mellitus, dyslipidemia  and male gender. Past treatments include angiotensin blockers and diuretics. The current treatment provides moderate improvement. Compliance problems  include exercise.  Hypertensive end-organ damage includes kidney disease.     Past Medical History:  Diagnosis Date   BPH (benign prostatic hyperplasia)    Diabetes mellitus without complication (HCC)    Hyperlipidemia associated with type 2 diabetes mellitus (HCC)    Hypertension      Family History  Problem Relation Age of Onset   Thyroid  cancer Mother    Cancer Father    Cancer Brother      Current Outpatient Medications:    ACCU-CHEK GUIDE test strip, TEST BLOOD SUGAR THREE TIMES DAILY, Disp: 300 strip, Rfl: 2   Accu-Chek Softclix Lancets lancets, Use as instructed, Disp: 100 each, Rfl: 12   aspirin EC 81 MG tablet, Take 81 mg by mouth daily., Disp: , Rfl:    Blood Glucose Calibration (ACCU-CHEK GUIDE CONTROL) LIQD, USE AS DIRECTED, Disp: 1 each, Rfl: 1   chlorthalidone  (HYGROTON ) 25 MG tablet, TAKE 1 TABLET EVERY DAY, Disp: 90 tablet, Rfl: 3   Continuous Glucose Sensor (DEXCOM G7 SENSOR) MISC, USE AS DIRECTED TO CHECK BLOOD SUGARS., Disp: 2 each, Rfl: 1   fluticasone  (FLONASE ) 50 MCG/ACT nasal spray, Place 1 spray into both nostrils daily., Disp: 16 g, Rfl: 2   gabapentin  (NEURONTIN ) 100 MG capsule, TAKE 1 CAPSULE AT BEDTIME, Disp: 90 capsule, Rfl: 3   Lancets (ACCU-CHEK MULTICLIX) lancets, Use to check blood sugars 3 times a day. Dx code: e11.65, Disp: 300 each, Rfl: 2   metFORMIN  (GLUCOPHAGE ) 1000 MG tablet, TAKE 1 TABLET TWICE DAILY WITH MEALS, Disp: 180 tablet, Rfl: 3   Multiple Vitamin (MULTIVITAMIN) tablet, Take 1 tablet by mouth daily., Disp: , Rfl:    pantoprazole  (PROTONIX ) 40 MG tablet, TAKE 1 TABLET EVERY DAY, Disp: 90 tablet, Rfl: 3   rosuvastatin  (CRESTOR ) 10 MG tablet, TAKE 1 TABLET EVERY DAY, Disp: 30 tablet, Rfl: 0   tamsulosin  (FLOMAX ) 0.4 MG CAPS capsule, Take 1 capsule (0.4 mg total) by mouth 2 (two) times daily., Disp: 160 capsule, Rfl: 2   valsartan  (DIOVAN ) 160 MG tablet, TAKE 1 TABLET EVERY DAY, Disp: 90 tablet, Rfl: 3   Vitamin D , Ergocalciferol ,  (DRISDOL ) 1.25 MG (50000 UNIT) CAPS capsule, TAKE 1 CAPSULE TWICE WEEKLY ON TUESDAY AND FRIDAY, Disp: 24 capsule, Rfl: 3   Allergies  Allergen Reactions   Amlodipine  Other (See Comments)    Headaches    Farxiga  [Dapagliflozin] Other (See Comments)    headaches     Review of Systems  Constitutional: Negative.   Eyes:  Negative for blurred vision.  Respiratory: Negative.  Negative for shortness of breath.   Cardiovascular:  Negative for chest pain and palpitations.  Gastrointestinal: Negative.   Endocrine: Negative for polydipsia, polyphagia and polyuria.  Skin: Negative.   Allergic/Immunologic: Negative.   Hematological: Negative.      Today's Vitals   10/07/23 1500  BP: 110/68  Temp: 98.4 F (36.9 C)  SpO2: 98%  Weight: 196 lb (88.9 kg)  Height: 5' 10 (1.778 m)   Body mass index is 28.12 kg/m.  Wt Readings from Last 3 Encounters:  10/07/23 196 lb (88.9 kg)  06/03/23 199 lb 9.6 oz (90.5 kg)  04/01/23 201 lb 6.4 oz (91.4 kg)     Objective:  Physical Exam Vitals and nursing note reviewed.  Constitutional:      Appearance: Normal appearance.  HENT:     Head: Normocephalic and atraumatic.  Eyes:  Extraocular Movements: Extraocular movements intact.  Cardiovascular:     Rate and Rhythm: Normal rate and regular rhythm.     Heart sounds: Normal heart sounds.  Pulmonary:     Effort: Pulmonary effort is normal.     Breath sounds: Normal breath sounds.  Musculoskeletal:        General: Normal range of motion.     Cervical back: Normal range of motion.     Comments: Neg Phalen's, Tinel's b/l  Skin:    General: Skin is warm.  Neurological:     General: No focal deficit present.     Mental Status: He is alert.  Psychiatric:        Mood and Affect: Mood normal.         Assessment And Plan:  Type 2 diabetes mellitus with stage 3a chronic kidney disease, without long-term current use of insulin (HCC) Assessment & Plan: Chronic.  Blood sugars suboptimal,  morning levels 130-140 mg/dL. Hypertension managed with valsartan  and chlorthalidone . - Continue metformin  for diabetes management. - Continue valsartan  160 mg for blood pressure control. - Continue chlorthalidone  for blood pressure control. - Obtain eye exam and ensure results are sent to provider.  Orders: -     CMP14+EGFR -     Hemoglobin A1c  Pure hypercholesterolemia Assessment & Plan: Chronic, LDL goal is less than 70.  He will continue with rosuvastatin  10mg  daily. He is encouraged to follow heart healthy lifestyle.    Hypertensive nephropathy  Cervicalgia Assessment & Plan: Chronic neck pain with cracking sounds and occasional hand numbness. Differential includes cervical radiculopathy or nerve compression. - Consider X-ray of the neck. - Order nerve conduction study to evaluate for cervical radiculopathy or other nerve compression issues. - Consider MRI if nerve conduction study suggests cervical involvement.   Paresthesia of both hands Assessment & Plan: Sx have worsened recently.  - Order nerve conduction study to evaluate for cervical radiculopathy or other nerve compression issues. - Consider MRI if nerve conduction study suggests cervical involvement.  Orders: -     Ambulatory referral to Neurology  Bilateral wrist pain Assessment & Plan: Bilateral wrist pain and limited range of motion post-1994 fractures. Previous surgical consultation declined. - Likely due to OA   Immunization due -     Flu vaccine HIGH DOSE PF(Fluzone Trivalent)   Return in about 4 months (around 02/06/2024), or dm check.  Patient was given opportunity to ask questions. Patient verbalized understanding of the plan and was able to repeat key elements of the plan. All questions were answered to their satisfaction.   I, [name], MD, have reviewed all documentation for this visit. The documentation on 10/07/23 for the exam, diagnosis, procedures, and orders are all accurate and  complete.   IF YOU HAVE BEEN REFERRED TO A SPECIALIST, IT MAY TAKE 1-2 WEEKS TO SCHEDULE/PROCESS THE REFERRAL. IF YOU HAVE NOT HEARD FROM US /SPECIALIST IN TWO WEEKS, PLEASE GIVE US  A CALL AT [phone] X [phone].   THE PATIENT IS ENCOURAGED TO PRACTICE SOCIAL DISTANCING DUE TO THE COVID-19 PANDEMIC.

## 2023-10-07 NOTE — Assessment & Plan Note (Signed)
 Chronic neck pain with cracking sounds and occasional hand numbness. Differential includes cervical radiculopathy or nerve compression. - Consider X-ray of the neck. - Order nerve conduction study to evaluate for cervical radiculopathy or other nerve compression issues. - Consider MRI if nerve conduction study suggests cervical involvement.

## 2023-10-07 NOTE — Assessment & Plan Note (Signed)
 Sx have worsened recently.  - Order nerve conduction study to evaluate for cervical radiculopathy or other nerve compression issues. - Consider MRI if nerve conduction study suggests cervical involvement.

## 2023-10-07 NOTE — Assessment & Plan Note (Signed)
 Bilateral wrist pain and limited range of motion post-1994 fractures. Previous surgical consultation declined. - Likely due to OA

## 2023-10-08 LAB — CMP14+EGFR
ALT: 12 IU/L (ref 0–44)
AST: 16 IU/L (ref 0–40)
Albumin: 4.4 g/dL (ref 3.8–4.8)
Alkaline Phosphatase: 74 IU/L (ref 47–123)
BUN/Creatinine Ratio: 12 (ref 10–24)
BUN: 15 mg/dL (ref 8–27)
Bilirubin Total: 0.3 mg/dL (ref 0.0–1.2)
CO2: 25 mmol/L (ref 20–29)
Calcium: 10.5 mg/dL — ABNORMAL HIGH (ref 8.6–10.2)
Chloride: 102 mmol/L (ref 96–106)
Creatinine, Ser: 1.25 mg/dL (ref 0.76–1.27)
Globulin, Total: 2.8 g/dL (ref 1.5–4.5)
Glucose: 99 mg/dL (ref 70–99)
Potassium: 4.5 mmol/L (ref 3.5–5.2)
Sodium: 139 mmol/L (ref 134–144)
Total Protein: 7.2 g/dL (ref 6.0–8.5)
eGFR: 61 mL/min/1.73 (ref >59)

## 2023-10-08 LAB — HEMOGLOBIN A1C
Est. average glucose Bld gHb Est-mCnc: 166 mg/dL
Hgb A1c MFr Bld: 7.4 % — ABNORMAL HIGH (ref 4.8–5.6)

## 2023-10-11 ENCOUNTER — Ambulatory Visit: Payer: Self-pay | Admitting: Internal Medicine

## 2023-10-11 DIAGNOSIS — E119 Type 2 diabetes mellitus without complications: Secondary | ICD-10-CM | POA: Diagnosis not present

## 2023-10-29 ENCOUNTER — Ambulatory Visit

## 2023-10-29 ENCOUNTER — Other Ambulatory Visit: Payer: Self-pay

## 2023-10-29 ENCOUNTER — Ambulatory Visit: Payer: Medicare HMO

## 2023-10-29 VITALS — BP 118/60 | HR 79 | Temp 98.0°F | Ht 70.0 in | Wt 199.4 lb

## 2023-10-29 DIAGNOSIS — Z0000 Encounter for general adult medical examination without abnormal findings: Secondary | ICD-10-CM

## 2023-10-29 NOTE — Patient Instructions (Signed)
 [name] ,  Thank you for taking the time for your Medicare Wellness Visit. I appreciate your continued commitment to your health goals. Please review the care plan we discussed, and feel free to reach out if I can assist you further.  Medicare recommends these wellness visits once per year to help you and your care team stay ahead of potential health issues. These visits are designed to focus on prevention, allowing your provider to concentrate on managing your acute and chronic conditions during your regular appointments.  Please note that Annual Wellness Visits do not include a physical exam. Some assessments may be limited, especially if the visit was conducted virtually. If needed, we may recommend a separate in-person follow-up with your provider.  Ongoing Care Seeing your primary care provider every 3 to 6 months helps us  monitor your health and provide consistent, personalized care.   Referrals If a referral was made during today's visit and you haven't received any updates within two weeks, please contact the referred provider directly to check on the status.  Recommended Screenings:  Health Maintenance  Topic Date Due   COVID-19 Vaccine (6 - 2025-26 season) 09/08/2023   Eye exam for diabetics  09/22/2023   Zoster (Shingles) Vaccine (1 of 2) 01/06/2024*   Pneumococcal Vaccine for age over 46 (1 of 2 - PCV) 06/02/2024*   Hemoglobin A1C  04/05/2024   Yearly kidney health urinalysis for diabetes  06/02/2024   Complete foot exam   06/02/2024   Yearly kidney function blood test for diabetes  10/06/2024   Medicare Annual Wellness Visit  10/28/2024   Colon Cancer Screening  05/06/2027   DTaP/Tdap/Td vaccine (2 - Td or Tdap) 04/08/2031   Flu Shot  Completed   Hepatitis C Screening  Completed   Meningitis B Vaccine  Aged Out   Screening for Lung Cancer  Discontinued  *Topic was postponed. The date shown is not the original due date.       10/29/2023    3:37 PM  Advanced  Directives  Does Patient Have a Medical Advance Directive? No  Would patient like information on creating a medical advance directive? No - Patient declined   Advance Care Planning is important because it: Ensures you receive medical care that aligns with your values, goals, and preferences. Provides guidance to your family and loved ones, reducing the emotional burden of decision-making during critical moments.  Vision: Annual vision screenings are recommended for early detection of glaucoma, cataracts, and diabetic retinopathy. These exams can also reveal signs of chronic conditions such as diabetes and high blood pressure.  Dental: Annual dental screenings help detect early signs of oral cancer, gum disease, and other conditions linked to overall health, including heart disease and diabetes.  Please see the attached documents for additional preventive care recommendations.

## 2023-10-29 NOTE — Progress Notes (Signed)
 Subjective:   Kelly Lambert  is a 73 y.o. who presents for a Medicare Wellness preventive visit.  As a reminder, Annual Wellness Visits don't include a physical exam, and some assessments may be limited, especially if this visit is performed virtually. We may recommend an in-person follow-up visit with your provider if needed.  Visit Complete: In person    Persons Participating in Visit: Patient.  AWV Questionnaire: No: Patient Medicare AWV questionnaire was not completed prior to this visit.  Cardiac Risk Factors include: advanced age (>8men, >103 women);diabetes mellitus;dyslipidemia;hypertension;male gender     Objective:    Today's Vitals   10/29/23 1531  BP: 118/60  Pulse: 79  Temp: 98 F (36.7 C)  TempSrc: Oral  SpO2: 96%  Weight: 199 lb 6.4 oz (90.4 kg)  Height: 5' 10 (1.778 m)   Body mass index is 28.61 kg/m.     10/29/2023    3:37 PM 10/23/2022    3:41 PM 10/17/2021    3:43 PM 08/09/2020   10:43 AM 07/28/2019    2:59 PM 05/21/2018    2:50 PM 04/07/2018    3:11 PM  Advanced Directives  Does Patient Have a Medical Advance Directive? No No No No No No No   Would patient like information on creating a medical advance directive? No - Patient declined  No - Patient declined Yes (MAU/Ambulatory/Procedural Areas - Information given)   No - Patient declined      Data saved with a previous flowsheet row definition    Current Medications (verified) Outpatient Encounter Medications as of 10/29/2023  Medication Sig   ACCU-CHEK GUIDE test strip TEST BLOOD SUGAR THREE TIMES DAILY   Accu-Chek Softclix Lancets lancets Use as instructed   aspirin EC 81 MG tablet Take 81 mg by mouth daily.   Blood Glucose Calibration (ACCU-CHEK GUIDE CONTROL) LIQD USE AS DIRECTED   chlorthalidone  (HYGROTON ) 25 MG tablet TAKE 1 TABLET EVERY DAY   fluticasone  (FLONASE ) 50 MCG/ACT nasal spray Place 1 spray into both nostrils daily.   gabapentin  (NEURONTIN ) 100 MG capsule TAKE 1 CAPSULE  AT BEDTIME   Lancets (ACCU-CHEK MULTICLIX) lancets Use to check blood sugars 3 times a day. Dx code: e11.65   metFORMIN  (GLUCOPHAGE ) 1000 MG tablet TAKE 1 TABLET TWICE DAILY WITH MEALS   Multiple Vitamin (MULTIVITAMIN) tablet Take 1 tablet by mouth daily.   pantoprazole  (PROTONIX ) 40 MG tablet TAKE 1 TABLET EVERY DAY   rosuvastatin  (CRESTOR ) 10 MG tablet TAKE 1 TABLET EVERY DAY   tamsulosin  (FLOMAX ) 0.4 MG CAPS capsule Take 1 capsule (0.4 mg total) by mouth 2 (two) times daily.   valsartan  (DIOVAN ) 160 MG tablet TAKE 1 TABLET EVERY DAY   Vitamin D , Ergocalciferol , (DRISDOL ) 1.25 MG (50000 UNIT) CAPS capsule TAKE 1 CAPSULE TWICE WEEKLY ON TUESDAY AND FRIDAY   Continuous Glucose Sensor (DEXCOM G7 SENSOR) MISC USE AS DIRECTED TO CHECK BLOOD SUGARS. (Patient not taking: Reported on 10/29/2023)   No facility-administered encounter medications on file as of 10/29/2023.    Allergies (verified) Amlodipine  and Farxiga  [dapagliflozin]   History: Past Medical History:  Diagnosis Date   BPH (benign prostatic hyperplasia)    Diabetes mellitus without complication (HCC)    Hyperlipidemia associated with type 2 diabetes mellitus (HCC)    Hypertension    Past Surgical History:  Procedure Laterality Date   JOINT REPLACEMENT Bilateral 2011   knee   Family History  Problem Relation Age of Onset   Thyroid  cancer Mother    Cancer Father  Cancer Brother    Social History   Socioeconomic History   Marital status: Married    Spouse name: Not on file   Number of children: 3   Years of education: Not on file   Highest education level: 12th grade  Occupational History   Occupation: part time work  Tobacco Use   Smoking status: Former    Current packs/day: 0.00    Average packs/day: 1 pack/day for 25.0 years (25.0 ttl pk-yrs)    Types: Cigarettes, Cigars    Start date: 49    Quit date: 1999    Years since quitting: 26.8   Smokeless tobacco: Former    Types: Chew    Quit date: 2018    Tobacco comments:    Quit cigarettes in 1999. He started to smoke cigars 2 years ago. 2 cigs/daily.   Vaping Use   Vaping status: Never Used  Substance and Sexual Activity   Alcohol use: Not Currently   Drug use: Not Currently   Sexual activity: Yes  Other Topics Concern   Not on file  Social History Narrative   Patient reports he works 7 days per week; currently out of work at his M-F job at [employer] due to [employer] pandemic. 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 Patient participates in daily exercise walking 7-8 miles per day.   Social Drivers of Corporate investment banker Strain: Low Risk  (10/29/2023)   Overall Financial Resource Strain (CARDIA)    Difficulty of Paying Living Expenses: Not hard at all  Food Insecurity: No Food Insecurity (10/29/2023)   Hunger Vital Sign    Worried About Running Out of Food in the Last Year: Never true    Ran Out of Food in the Last Year: Never true  Transportation Needs: No Transportation Needs (10/29/2023)   PRAPARE - Administrator, Civil Service (Medical): No    Lack of Transportation (Non-Medical): No  Physical Activity: Sufficiently Active (10/29/2023)   Exercise Vital Sign    Days of Exercise per Week: 5 days    Minutes of Exercise per Session: 90 min  Stress: No Stress Concern Present (10/29/2023)   [employer] of Occupational Health - Occupational Stress Questionnaire    Feeling of Stress: Not at all  Social Connections: Moderately Integrated (10/29/2023)   Social Connection and Isolation Panel    Frequency of Communication with Friends and Family: More than three times a week    Frequency of Social Gatherings with Friends and Family: Once a week    Attends Religious Services: More than 4 times per year    Active Member of [employer] or Organizations: No    Attends Banker Meetings: Never    Marital Status: Married    Tobacco Counseling Counseling given:  Not Answered Tobacco comments: Quit cigarettes in 1999. He started to smoke cigars 2 years ago. 2 cigs/daily.     Clinical Intake:  Pre-visit preparation completed: Yes  Pain : No/denies pain     Nutritional Status: BMI 25 -29 Overweight Nutritional Risks: None Diabetes: Yes CBG done?: No Did pt. bring in CBG monitor from home?: No  Lab Results  Component Value Date   HGBA1C 7.4 (H) 10/07/2023   HGBA1C 7.2 (H) 06/03/2023   HGBA1C 8.0 (H) 01/21/2023     How often do you need to have someone help you when you read instructions, pamphlets, or other written materials from your doctor or  pharmacy?: 1 - Never  Interpreter Needed?: No  Information entered by :: NAllen LPN   Activities of Daily Living     10/29/2023    3:33 PM  In your present state of health, do you have any difficulty performing the following activities:  Hearing? 0  Vision? 0  Difficulty concentrating or making decisions? 0  Walking or climbing stairs? 0  Dressing or bathing? 0  Doing errands, shopping? 0  Preparing Food and eating ? N  Using the Toilet? N  In the past six months, have you accidently leaked urine? N  Do you have problems with loss of bowel control? N  Managing your Medications? N  Managing your Finances? N  Housekeeping or managing your Housekeeping? N    Patient Care Team: Jarold Medici, MD as PCP - General (Internal Medicine) Santo Stanly LABOR, MD as PCP - Cardiology (Cardiology) Pearson, Vallie J, [facility] (Inactive) (Pharmacist)  I have updated your Care Teams any recent Medical Services you may have received from other providers in the past year.     Assessment:   This is a routine wellness examination for Kelly Lambert.  Hearing/Vision screen Hearing Screening - Comments:: Denies hearing issues Vision Screening - Comments:: Regular eye exams, [facility]   Goals Addressed             This Visit's Progress    Patient Stated       10/29/2023, trying to get A1C  down       Depression Screen     10/29/2023    3:39 PM 06/03/2023    2:58 PM 10/23/2022    3:43 PM 09/24/2022    2:58 PM 05/22/2022    3:08 PM 10/17/2021    3:44 PM 08/09/2020   10:46 AM  PHQ 2/9 Scores  PHQ - 2 Score 0 0 0 0 0 0 0  PHQ- 9 Score  0 0 0       Fall Risk     10/29/2023    3:38 PM 10/07/2023    3:01 PM 06/03/2023    2:58 PM 01/21/2023    3:43 PM 10/23/2022    3:42 PM  Fall Risk   Falls in the past year? 0 0 0 0 0  Number falls in past yr: 0 0 0 0 0  Injury with Fall? 0 0 0 0 0  Risk for fall due to : Medication side effect No Fall Risks No Fall Risks No Fall Risks Medication side effect  Follow up Falls evaluation completed;Falls prevention discussed Falls evaluation completed Falls evaluation completed Falls evaluation completed Falls prevention discussed;Falls evaluation completed    MEDICARE RISK AT HOME:  Medicare Risk at Home Any stairs in or around the home?: No If so, are there any without handrails?: No Home free of loose throw rugs in walkways, pet beds, electrical cords, etc?: Yes Adequate lighting in your home to reduce risk of falls?: Yes Life alert?: No Use of a cane, walker or w/c?: No Grab bars in the bathroom?: No Shower chair or bench in shower?: No Elevated toilet seat or a handicapped toilet?: Yes  TIMED UP AND GO:  Was the test performed?  Yes  Length of time to ambulate 10 feet: 5 sec Gait steady and fast without use of assistive device  Cognitive Function: 6CIT completed        10/29/2023    3:39 PM 10/23/2022    3:43 PM 10/17/2021    3:45 PM 08/09/2020   10:47  AM 07/28/2019    3:02 PM  6CIT Screen  What Year? 0 points 0 points 0 points 0 points 0 points  What month? 0 points 0 points 0 points 0 points 0 points  What time? 0 points 0 points 0 points 0 points 0 points  Count back from 20 0 points 0 points 0 points 0 points 0 points  Months in reverse 0 points 0 points 2 points 0 points 0 points  Repeat phrase 0 points 6  points 2 points 4 points 2 points  Total Score 0 points 6 points 4 points 4 points 2 points    Immunizations Immunization History  Administered Date(s) Administered   Fluad Quad(high Dose 65+) 09/18/2018, 04/05/2020, 10/17/2021   Fluad Trivalent(High Dose 65+) 09/24/2022   INFLUENZA, HIGH DOSE SEASONAL PF 10/07/2023   Influenza-Unspecified 09/30/2018, 11/02/2019, 10/09/2020   PFIZER(Purple Top)SARS-COV-2 Vaccination 02/13/2019, 03/06/2019, 10/11/2019, 05/05/2020   Pfizer Covid-19 Vaccine Bivalent Booster 37yrs & up 10/09/2020   Tdap 04/07/2021    Screening Tests Health Maintenance  Topic Date Due   COVID-19 Vaccine (6 - 2025-26 season) 09/08/2023   OPHTHALMOLOGY EXAM  09/22/2023   Zoster Vaccines- Shingrix (1 of 2) 01/06/2024 (Originally 02/27/1969)   Pneumococcal Vaccine: 50+ Years (1 of 2 - PCV) 06/02/2024 (Originally 02/27/1969)   HEMOGLOBIN A1C  04/05/2024   Diabetic kidney evaluation - Urine ACR  06/02/2024   FOOT EXAM  06/02/2024   Diabetic kidney evaluation - eGFR measurement  10/06/2024   Medicare Annual Wellness (AWV)  10/28/2024   Colonoscopy  05/06/2027   DTaP/Tdap/Td (2 - Td or Tdap) 04/08/2031   Influenza Vaccine  Completed   Hepatitis C Screening  Completed   Meningococcal B Vaccine  Aged Out   Lung Cancer Screening  Discontinued    Health Maintenance Items Addressed: Requesting eye exam. Declines covid vaccine.  Additional Screening:  Vision Screening: Recommended annual ophthalmology exams for early detection of glaucoma and other disorders of the eye. Is the patient up to date with their annual eye exam?  Yes  Who is the provider or what is the name of the office in which the patient attends annual eye exams? [facility]  Dental Screening: Recommended annual dental exams for proper oral hygiene  Community Resource Referral / Chronic Care Management: CRR required this visit?  No   CCM required this visit?  No   Plan:    I have personally reviewed  and noted the following in the patient's chart:   Medical and social history Use of alcohol, tobacco or illicit drugs  Current medications and supplements including opioid prescriptions. Patient is not currently taking opioid prescriptions. Functional ability and status Nutritional status Physical activity Advanced directives List of other physicians Hospitalizations, surgeries, and ER visits in previous 12 months Vitals Screenings to include cognitive, depression, and falls Referrals and appointments  In addition, I have reviewed and discussed with patient certain preventive protocols, quality metrics, and best practice recommendations. A written personalized care plan for preventive services as well as general preventive health recommendations were provided to patient.   Ardella FORBES Dawn, LPN   [id number]   After Visit Summary: (In Person-Declined) Patient declined AVS at this time.  Notes: Nothing significant to report at this time.

## 2023-10-30 LAB — PTH, INTACT AND CALCIUM
Calcium: 10.5 mg/dL — ABNORMAL HIGH (ref 8.6–10.2)
PTH: 43 pg/mL (ref 15–65)

## 2023-10-31 LAB — PROTEIN ELECTROPHORESIS, SERUM
A/G Ratio: 1 (ref 0.7–1.7)
Albumin ELP: 3.7 g/dL (ref 2.9–4.4)
Alpha 1: 0.2 g/dL (ref 0.0–0.4)
Alpha 2: 0.8 g/dL (ref 0.4–1.0)
Beta: 1.1 g/dL (ref 0.7–1.3)
Gamma Globulin: 1.7 g/dL (ref 0.4–1.8)
Globulin, Total: 3.8 g/dL (ref 2.2–3.9)
Total Protein: 7.5 g/dL (ref 6.0–8.5)

## 2023-11-19 ENCOUNTER — Ambulatory Visit: Payer: Self-pay | Admitting: Internal Medicine

## 2023-12-07 ENCOUNTER — Other Ambulatory Visit: Payer: Self-pay | Admitting: Internal Medicine

## 2024-01-16 ENCOUNTER — Other Ambulatory Visit: Payer: Self-pay | Admitting: Internal Medicine

## 2024-01-17 ENCOUNTER — Other Ambulatory Visit: Payer: Self-pay | Admitting: Internal Medicine

## 2024-01-22 ENCOUNTER — Other Ambulatory Visit: Payer: Self-pay | Admitting: Internal Medicine

## 2024-02-09 ENCOUNTER — Ambulatory Visit: Payer: Self-pay | Admitting: Internal Medicine

## 2024-02-10 ENCOUNTER — Ambulatory Visit: Admitting: Internal Medicine

## 2024-02-10 ENCOUNTER — Encounter: Payer: Self-pay | Admitting: Internal Medicine

## 2024-02-10 VITALS — BP 110/74 | HR 85 | Temp 98.3°F | Ht 70.0 in | Wt 206.4 lb

## 2024-02-10 DIAGNOSIS — I129 Hypertensive chronic kidney disease with stage 1 through stage 4 chronic kidney disease, or unspecified chronic kidney disease: Secondary | ICD-10-CM

## 2024-02-10 DIAGNOSIS — E7800 Pure hypercholesterolemia, unspecified: Secondary | ICD-10-CM

## 2024-02-10 DIAGNOSIS — N1831 Chronic kidney disease, stage 3a: Secondary | ICD-10-CM

## 2024-02-10 DIAGNOSIS — R5383 Other fatigue: Secondary | ICD-10-CM | POA: Diagnosis not present

## 2024-02-10 DIAGNOSIS — Z79899 Other long term (current) drug therapy: Secondary | ICD-10-CM

## 2024-02-10 DIAGNOSIS — E1122 Type 2 diabetes mellitus with diabetic chronic kidney disease: Secondary | ICD-10-CM

## 2024-02-10 MED ORDER — ROSUVASTATIN CALCIUM 10 MG PO TABS: 10.0000 mg | ORAL_TABLET | Freq: Every day | ORAL | 2 refills | Status: AC

## 2024-02-10 NOTE — Patient Instructions (Signed)

## 2024-02-11 LAB — CMP14+EGFR
ALT: 12 [IU]/L (ref 0–44)
AST: 13 [IU]/L (ref 0–40)
Albumin: 4.3 g/dL (ref 3.8–4.8)
Alkaline Phosphatase: 86 [IU]/L (ref 47–123)
BUN/Creatinine Ratio: 13 (ref 10–24)
BUN: 16 mg/dL (ref 8–27)
Bilirubin Total: 0.2 mg/dL (ref 0.0–1.2)
CO2: 23 mmol/L (ref 20–29)
Calcium: 10.9 mg/dL — ABNORMAL HIGH (ref 8.6–10.2)
Chloride: 103 mmol/L (ref 96–106)
Creatinine, Ser: 1.2 mg/dL (ref 0.76–1.27)
Globulin, Total: 3 g/dL (ref 1.5–4.5)
Glucose: 193 mg/dL — ABNORMAL HIGH (ref 70–99)
Potassium: 4.1 mmol/L (ref 3.5–5.2)
Sodium: 136 mmol/L (ref 134–144)
Total Protein: 7.3 g/dL (ref 6.0–8.5)
eGFR: 64 mL/min/{1.73_m2}

## 2024-02-11 LAB — CBC
Hematocrit: 39 % (ref 37.5–51.0)
Hemoglobin: 12.8 g/dL — ABNORMAL LOW (ref 13.0–17.7)
MCH: 31.3 pg (ref 26.6–33.0)
MCHC: 32.8 g/dL (ref 31.5–35.7)
MCV: 95 fL (ref 79–97)
Platelets: 193 10*3/uL (ref 150–450)
RBC: 4.09 x10E6/uL — ABNORMAL LOW (ref 4.14–5.80)
RDW: 14 % (ref 11.6–15.4)
WBC: 4.1 10*3/uL (ref 3.4–10.8)

## 2024-02-11 LAB — VITAMIN B12: Vitamin B-12: 687 pg/mL (ref 232–1245)

## 2024-02-11 LAB — HEMOGLOBIN A1C
Est. average glucose Bld gHb Est-mCnc: 197 mg/dL
Hgb A1c MFr Bld: 8.5 % — ABNORMAL HIGH (ref 4.8–5.6)

## 2024-02-11 LAB — TSH: TSH: 2 u[IU]/mL (ref 0.450–4.500)

## 2024-02-11 NOTE — Assessment & Plan Note (Signed)
 Chronic, continue with rosuvastatin .  LDL goal is less than 70.  - Refill sent to pharmacy. - Follow heart healthy lifestyle.

## 2024-02-11 NOTE — Assessment & Plan Note (Signed)
 Chronic, well controlled.  Managed with chlorthalidone  and valsartan .   - Continue with chlorthalidone  25mg  daily - Continue with valsartan  160mg  daily - Follow low sodium diet - Follow up in four months

## 2024-02-12 ENCOUNTER — Ambulatory Visit: Payer: Self-pay | Admitting: Internal Medicine

## 2024-04-15 ENCOUNTER — Ambulatory Visit: Admitting: Internal Medicine

## 2024-06-08 ENCOUNTER — Encounter: Payer: Self-pay | Admitting: Internal Medicine

## 2024-06-08 ENCOUNTER — Encounter: Admitting: Internal Medicine

## 2024-06-09 ENCOUNTER — Ambulatory Visit: Payer: Self-pay | Admitting: Internal Medicine

## 2024-12-01 ENCOUNTER — Ambulatory Visit: Payer: Self-pay
